# Patient Record
Sex: Female | Born: 1955 | Race: White | Hispanic: No | Marital: Married | Smoking: Never smoker
Health system: Southern US, Community
[De-identification: ages and names within clinical notes are randomized; demographics above are authoritative.]

## PROBLEM LIST (undated history)

## (undated) DIAGNOSIS — M1990 Unspecified osteoarthritis, unspecified site: Secondary | ICD-10-CM

## (undated) DIAGNOSIS — T7840XA Allergy, unspecified, initial encounter: Secondary | ICD-10-CM

## (undated) DIAGNOSIS — F988 Other specified behavioral and emotional disorders with onset usually occurring in childhood and adolescence: Secondary | ICD-10-CM

## (undated) HISTORY — PX: TUBAL LIGATION: SHX77

## (undated) HISTORY — PX: BILATERAL SALPINGECTOMY: SHX5743

## (undated) HISTORY — DX: Other specified behavioral and emotional disorders with onset usually occurring in childhood and adolescence: F98.8

## (undated) HISTORY — PX: COSMETIC SURGERY: SHX468

## (undated) HISTORY — DX: Allergy, unspecified, initial encounter: T78.40XA

---

## 2004-04-13 ENCOUNTER — Encounter: Admission: RE | Admit: 2004-04-13 | Discharge: 2004-04-13 | Payer: Self-pay | Admitting: *Deleted

## 2004-05-06 ENCOUNTER — Ambulatory Visit (HOSPITAL_COMMUNITY): Admission: RE | Admit: 2004-05-06 | Discharge: 2004-05-06 | Payer: Self-pay | Admitting: *Deleted

## 2004-05-06 ENCOUNTER — Encounter (INDEPENDENT_AMBULATORY_CARE_PROVIDER_SITE_OTHER): Payer: Self-pay | Admitting: *Deleted

## 2004-11-02 ENCOUNTER — Ambulatory Visit: Payer: Self-pay | Admitting: Internal Medicine

## 2005-01-20 ENCOUNTER — Ambulatory Visit: Payer: Self-pay | Admitting: Family Medicine

## 2005-05-11 ENCOUNTER — Encounter: Admission: RE | Admit: 2005-05-11 | Discharge: 2005-05-11 | Payer: Self-pay | Admitting: *Deleted

## 2006-05-02 ENCOUNTER — Ambulatory Visit: Payer: Self-pay | Admitting: Family Medicine

## 2006-05-08 ENCOUNTER — Ambulatory Visit: Payer: Self-pay | Admitting: Family Medicine

## 2006-05-22 ENCOUNTER — Encounter: Admission: RE | Admit: 2006-05-22 | Discharge: 2006-05-22 | Payer: Self-pay | Admitting: *Deleted

## 2007-06-10 ENCOUNTER — Encounter: Admission: RE | Admit: 2007-06-10 | Discharge: 2007-06-10 | Payer: Self-pay | Admitting: *Deleted

## 2007-11-25 ENCOUNTER — Telehealth (INDEPENDENT_AMBULATORY_CARE_PROVIDER_SITE_OTHER): Payer: Self-pay | Admitting: *Deleted

## 2007-12-07 ENCOUNTER — Encounter: Payer: Self-pay | Admitting: Family Medicine

## 2007-12-11 ENCOUNTER — Telehealth (INDEPENDENT_AMBULATORY_CARE_PROVIDER_SITE_OTHER): Payer: Self-pay | Admitting: *Deleted

## 2007-12-18 ENCOUNTER — Ambulatory Visit: Payer: Self-pay | Admitting: Family Medicine

## 2007-12-18 DIAGNOSIS — F329 Major depressive disorder, single episode, unspecified: Secondary | ICD-10-CM | POA: Insufficient documentation

## 2008-06-16 ENCOUNTER — Encounter: Admission: RE | Admit: 2008-06-16 | Discharge: 2008-06-16 | Payer: Self-pay | Admitting: Obstetrics and Gynecology

## 2008-06-19 ENCOUNTER — Encounter: Admission: RE | Admit: 2008-06-19 | Discharge: 2008-06-19 | Payer: Self-pay | Admitting: Obstetrics and Gynecology

## 2008-07-30 ENCOUNTER — Encounter: Payer: Self-pay | Admitting: Family Medicine

## 2008-08-25 ENCOUNTER — Telehealth (INDEPENDENT_AMBULATORY_CARE_PROVIDER_SITE_OTHER): Payer: Self-pay | Admitting: *Deleted

## 2008-12-10 ENCOUNTER — Telehealth (INDEPENDENT_AMBULATORY_CARE_PROVIDER_SITE_OTHER): Payer: Self-pay | Admitting: *Deleted

## 2009-02-02 ENCOUNTER — Ambulatory Visit: Payer: Self-pay | Admitting: Family Medicine

## 2009-02-05 ENCOUNTER — Ambulatory Visit: Payer: Self-pay | Admitting: Family Medicine

## 2009-02-05 LAB — CONVERTED CEMR LAB
Albumin: 3.5 g/dL (ref 3.5–5.2)
Alkaline Phosphatase: 64 units/L (ref 39–117)
BUN: 18 mg/dL (ref 6–23)
Basophils Absolute: 0 10*3/uL (ref 0.0–0.1)
Basophils Relative: 0.5 % (ref 0.0–3.0)
Bilirubin, Direct: 0 mg/dL (ref 0.0–0.3)
Calcium: 8.6 mg/dL (ref 8.4–10.5)
Eosinophils Absolute: 0.1 10*3/uL (ref 0.0–0.7)
GFR calc non Af Amer: 93.43 mL/min (ref >60)
Glucose, Bld: 89 mg/dL (ref 70–99)
HDL: 88.5 mg/dL (ref >39.00)
Ketones, urine, test strip: NEGATIVE
Lymphocytes Relative: 32.9 % (ref 12.0–46.0)
MCHC: 33.6 g/dL (ref 30.0–36.0)
MCV: 97.2 fL (ref 78.0–100.0)
Monocytes Absolute: 0.4 10*3/uL (ref 0.1–1.0)
Neutrophils Relative %: 56.3 % (ref 43.0–77.0)
Nitrite: NEGATIVE
RBC: 3.9 M/uL (ref 3.87–5.11)
RDW: 12.2 % (ref 11.5–14.6)
Sodium: 141 meq/L (ref 135–145)
Specific Gravity, Urine: 1.025
TSH: 1.71 microintl units/mL (ref 0.35–5.50)
Total Protein: 5.7 g/dL — ABNORMAL LOW (ref 6.0–8.3)
Triglycerides: 45 mg/dL (ref 0.0–149.0)
Urobilinogen, UA: 0.2
VLDL: 9 mg/dL (ref 0.0–40.0)
WBC Urine, dipstick: NEGATIVE

## 2009-02-08 ENCOUNTER — Encounter (INDEPENDENT_AMBULATORY_CARE_PROVIDER_SITE_OTHER): Payer: Self-pay | Admitting: *Deleted

## 2009-05-03 ENCOUNTER — Encounter: Payer: Self-pay | Admitting: Family Medicine

## 2009-06-22 ENCOUNTER — Telehealth (INDEPENDENT_AMBULATORY_CARE_PROVIDER_SITE_OTHER): Payer: Self-pay | Admitting: *Deleted

## 2009-07-14 ENCOUNTER — Encounter: Admission: RE | Admit: 2009-07-14 | Discharge: 2009-07-14 | Payer: Self-pay | Admitting: Obstetrics and Gynecology

## 2009-08-18 ENCOUNTER — Telehealth: Payer: Self-pay | Admitting: Family Medicine

## 2009-08-19 ENCOUNTER — Ambulatory Visit: Payer: Self-pay | Admitting: Family Medicine

## 2009-08-19 DIAGNOSIS — S0990XA Unspecified injury of head, initial encounter: Secondary | ICD-10-CM | POA: Insufficient documentation

## 2009-08-20 ENCOUNTER — Telehealth (INDEPENDENT_AMBULATORY_CARE_PROVIDER_SITE_OTHER): Payer: Self-pay | Admitting: *Deleted

## 2009-10-05 ENCOUNTER — Telehealth: Payer: Self-pay | Admitting: Family Medicine

## 2009-11-19 ENCOUNTER — Telehealth (INDEPENDENT_AMBULATORY_CARE_PROVIDER_SITE_OTHER): Payer: Self-pay | Admitting: *Deleted

## 2009-11-26 ENCOUNTER — Ambulatory Visit: Payer: Self-pay | Admitting: Family Medicine

## 2010-03-28 ENCOUNTER — Telehealth (INDEPENDENT_AMBULATORY_CARE_PROVIDER_SITE_OTHER): Payer: Self-pay | Admitting: *Deleted

## 2010-03-29 ENCOUNTER — Telehealth (INDEPENDENT_AMBULATORY_CARE_PROVIDER_SITE_OTHER): Payer: Self-pay | Admitting: *Deleted

## 2010-06-16 ENCOUNTER — Telehealth (INDEPENDENT_AMBULATORY_CARE_PROVIDER_SITE_OTHER): Payer: Self-pay | Admitting: *Deleted

## 2010-06-21 ENCOUNTER — Encounter: Payer: Self-pay | Admitting: Family Medicine

## 2010-07-09 ENCOUNTER — Ambulatory Visit: Payer: Self-pay | Admitting: Family Medicine

## 2010-07-11 ENCOUNTER — Telehealth (INDEPENDENT_AMBULATORY_CARE_PROVIDER_SITE_OTHER): Payer: Self-pay | Admitting: *Deleted

## 2010-07-29 ENCOUNTER — Encounter: Payer: Self-pay | Admitting: Family Medicine

## 2010-08-02 ENCOUNTER — Telehealth (INDEPENDENT_AMBULATORY_CARE_PROVIDER_SITE_OTHER): Payer: Self-pay | Admitting: *Deleted

## 2010-08-29 ENCOUNTER — Encounter: Payer: Self-pay | Admitting: Family Medicine

## 2010-08-29 ENCOUNTER — Ambulatory Visit: Payer: Self-pay | Admitting: Family Medicine

## 2010-08-31 ENCOUNTER — Encounter: Admission: RE | Admit: 2010-08-31 | Discharge: 2010-08-31 | Payer: Self-pay | Admitting: Family Medicine

## 2010-09-20 ENCOUNTER — Telehealth (INDEPENDENT_AMBULATORY_CARE_PROVIDER_SITE_OTHER): Payer: Self-pay | Admitting: *Deleted

## 2010-09-22 ENCOUNTER — Encounter (INDEPENDENT_AMBULATORY_CARE_PROVIDER_SITE_OTHER): Payer: Self-pay | Admitting: *Deleted

## 2010-11-06 ENCOUNTER — Encounter: Payer: Self-pay | Admitting: Family Medicine

## 2010-11-07 ENCOUNTER — Encounter: Payer: Self-pay | Admitting: Obstetrics and Gynecology

## 2010-11-16 NOTE — Assessment & Plan Note (Signed)
Summary: HIVES/DLO   Vital Signs:  Patient profile:   55 year old female Menstrual status:  regular Height:      68 inches (172.72 cm) Weight:      147.75 pounds (67.16 kg) BMI:     22.55 O2 Sat:      95 % on Room air Temp:     98.3 degrees F (36.83 degrees C) oral Pulse rate:   82 / minute BP sitting:   100 / 60  (left arm) Cuff size:   regular  Vitals Entered By: Lucious Groves CMA (July 09, 2010 10:11 AM)  O2 Flow:  Room air CC: C/O rash or hives/possibly bed bugs./kb, Rash Is Patient Diabetic? No Comments Patient notes that she has been having severe itching and Claritin/Benadryl have only helped temporarily. Patient has had allegy types symptoms such as eye redness, itch, and crust production, but denies fever, SOB, and HA./kb   History of Present Illness: Here with urticarial rash.  Recently started back Celebrex-one week ago.  Has taken benadryl and clarintan without relief. No change of soap, detergent, etc.  No hx of food allergy. Onset couple of days ago.  Rash      This is a 55 year old woman who presents with Rash.  The patient complains of hives and welts, but denies pustules and blisters.  The rash is located on the back, abdomen, right foot, and left thigh.  The rash is worse with heat and worse with scratching.  The patient denies the following symptoms: fever, headache, facial swelling, tongue swelling, burning, difficulty breathing, and sore throat.  The patient reports a history of new medication.  The patient denies history of recent tick bite.    Current Medications (verified): 1)  Zoloft 25 Mg  Tabs (Sertraline Hcl) .... Take One Tablet Daily 2)  Ritalin 10 Mg Tabs (Methylphenidate Hcl) .... Take 1 By Mouth Two Times A Day  Allergies (verified): No Known Drug Allergies  Review of Systems      See HPI  Physical Exam  General:  Well-developed,well-nourished,in no acute distress; alert,appropriate and cooperative throughout examination Eyes:  No  corneal or conjunctival inflammation noted. EOMI. Perrla. Funduscopic exam benign, without hemorrhages, exudates or papilledema. Vision grossly normal. Mouth:  Oral mucosa and oropharynx without lesions or exudates.  Teeth in good repair. Neck:  No deformities, masses, or tenderness noted. Lungs:  Normal respiratory effort, chest expands symmetrically. Lungs are clear to auscultation, no crackles or wheezes. Heart:  Normal rate and regular rhythm. S1 and S2 normal without gallop, murmur, click, rub or other extra sounds. Skin:  scattered hives buttocks, extremities and trunk. Cervical Nodes:  No lymphadenopathy noted   Impression & Recommendations:  Problem # 1:  URTICARIA (ICD-708.9) uncertain etiology.  She did recently start back Celebrex but no prior reactions to this.  Will hold Celebrex for now.  Consider addition of H2 blocker to her antihistaimine.  Depomedrol given. Orders: Depo- Medrol 80mg  (J1040) Admin of Therapeutic Inj  intramuscular or subcutaneous (64403)  Complete Medication List: 1)  Zoloft 25 Mg Tabs (Sertraline hcl) .... Take one tablet daily 2)  Ritalin 10 Mg Tabs (Methylphenidate hcl) .... Take 1 by mouth two times a day  Patient Instructions: 1)  Hold Celebrex for now. 2)  Continue antihistamines such as Allegra or Claritan 3)  Consider pepcid or zantac in addition to antihistamine if no better in a few days.   Medication Administration  Injection # 1:    Medication: Depo- Medrol  80mg     Diagnosis: URTICARIA (ICD-708.9)    Route: IM    Site: LUOQ gluteus    Exp Date: 01/14/2013    Lot #: [id number]    Mfr: Pharmacia    Patient tolerated injection without complications    Given by: Lucious Groves CMA (July 09, 2010 11:04 AM)  Orders Added: 1)  Depo- Medrol 80mg  [J1040] 2)  Admin of Therapeutic Inj  intramuscular or subcutaneous [96372] 3)  Est. Patient Level III [60454]

## 2010-11-16 NOTE — Progress Notes (Signed)
  Phone Note Refill Request   Refills Requested: Medication #1:  RITALIN 10 MG TABS take 1 by mouth two times a day.  Follow-up for Phone Call        left message rx is ready. Army Fossa CMA  March 28, 2010 3:02 PM     Prescriptions: RITALIN 10 MG TABS (METHYLPHENIDATE HCL) take 1 by mouth two times a day  #180 x 0   Entered by:   Army Fossa CMA   Authorized by:   Loreen Freud DO   Signed by:   Army Fossa CMA on 03/28/2010   Method used:   Print then Give to Patient   RxID:   [id number]

## 2010-11-16 NOTE — Letter (Signed)
Summary: Fax from Patient Regarding Need for EKG  Fax from Patient Regarding Need for EKG   Imported By: Lanelle Bal 08/16/2010 10:11:12  _____________________________________________________________________  External Attachment:    Type:   Image     Comment:   External Document

## 2010-11-16 NOTE — Progress Notes (Signed)
Summary: lmtc 9-2-RF REQUEST  Phone Note Refill Request Message from:  Patient on June 16, 2010 4:51 PM  Refills Requested: Medication #1:  RITALIN 10 MG TABS take 1 by mouth two times a day.   Last Refilled: 03/28/2010 LAST OV 11/19/09 LAST FILLED 03/28/10  Initial call taken by: Almeta Monas CMA Duncan Dull),  June 16, 2010 4:51 PM  Follow-up for Phone Call        refill x1 pt due for ov Follow-up by: Loreen Freud DO,  June 16, 2010 4:53 PM  Additional Follow-up for Phone Call Additional follow up Details #1::        Lmtcb.        Almeta Monas CMA Duncan Dull)  June 16, 2010 4:58 PM left message to call office office............Marland KitchenFelecia Deloach CMA  June 17, 2010 9:05 AM     Prescriptions: RITALIN 10 MG TABS (METHYLPHENIDATE HCL) take 1 by mouth two times a day  #30 x 0   Entered by:   Almeta Monas CMA (AAMA)   Authorized by:   Loreen Freud DO   Signed by:   Almeta Monas CMA (AAMA) on 06/17/2010   Method used:   Print then Give to Patient   RxID:   [id number] RITALIN 10 MG TABS (METHYLPHENIDATE HCL) take 1 by mouth two times a day  #180 x 0   Entered and Authorized by:   Loreen Freud DO   Signed by:   Loreen Freud DO on 06/16/2010   Method used:   Printed then faxed to ...       [facility]  [address] [phone]* (retail)       [address]       [place], [place]  [address]       Ph: [phone] or [phone]       Fax: [phone]   RxID:   [id number]  Pt notifeid 30 day supply printed for pt and the 180 day supply will be mailed to [facility].         Almeta Monas CMA Duncan Dull)  June 17, 2010 1:46 PM  Appended Document: lmtc 9-2-RF REQUEST Ritalin Rx mailed to [facility] per [place] at [employer]. [phone]       [facility] [phone]

## 2010-11-16 NOTE — Assessment & Plan Note (Signed)
Summary: RETURN VISIT///SPH   Vital Signs:  Patient profile:   55 year old female Menstrual status:  regular Temp:     98.2 degrees F oral Pulse rate:   82 / minute Pulse rhythm:   regular BP sitting:   122 / 80  (left arm) Cuff size:   regular  Vitals Entered By: Army Fossa CMA (November 26, 2009 12:52 PM) CC: follow up on ritalin.    History of Present Illness: pt her for ADD f/u.  No complaints.  Meds working well.  Current Medications (verified): 1)  Zoloft 25 Mg  Tabs (Sertraline Hcl) .... Take One Tablet Daily 2)  Ritalin 10 Mg Tabs (Methylphenidate Hcl) .... Take 1 By Mouth Two Times A Day  Allergies (verified): No Known Drug Allergies  Past History:  Past medical, surgical, family and social histories (including risk factors) reviewed for relevance to current acute and chronic problems.  Past Medical History: Reviewed history from 12/18/2007 and no changes required. Depression  Family History: Reviewed history from 12/18/2007 and no changes required. mother: living  father: living 3 brothers: living Family History Osteoporosis: father  Social History: Reviewed history from 02/02/2009 and no changes required. 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 Lungs are clear to auscultation, no crackles or wheezes. Heart:  normal rate and no murmur.   Psych:  Oriented X3 and normally interactive.     Impression & Recommendations:  Problem # 1:  ADD (ICD-314.00) ritalin 10 mg two times a day  rto 6 months  Complete Medication List: 1)  Zoloft 25 Mg Tabs (Sertraline hcl) .... Take one tablet daily 2)  Ritalin 10 Mg Tabs (Methylphenidate hcl) .... Take 1 by mouth  two times a day Prescriptions: RITALIN 10 MG TABS (METHYLPHENIDATE HCL) take 1 by mouth two times a day  #180 x 0   Entered and Authorized by:   Loreen Freud DO   Signed by:   Loreen Freud DO on 11/26/2009   Method used:   Printed then faxed to ...       [facility] [phone]* (retail)       [address]       [place], [place]  [address]       Ph: [phone] or [phone]       Fax: [phone]   RxID:   [id number]

## 2010-11-16 NOTE — Progress Notes (Signed)
Summary: hives  Phone Note Call from Patient   Caller: Patient Summary of Call: [facility] Triage Call Report Triage Record Num: [id number] Operator: Tomasita Crumble Patient Name: Jennifer Stokes Call Date & Time: 07/09/2010 8:56:44AM Patient Phone: [phone] PCP: Lelon Perla Patient Gender: Female PCP Fax : [phone] Patient DOB: 02/21/1956 Practice Name: [facility] Reason for Call: Pt. calling. States she started Celebrex 2-3 weeks ago and now has hives onset 9/22.; took it a year ago without sx . Claritin and Benadryl have helped some. Caller transferred to [facility] for same day appointment per Hives protocol. Protocol(s) Used: Hives Protocol(s) Used: Rash Recommended Outcome per Protocol: See Provider within 24 hours Reason for Outcome: Skin changes that looks like hives (itchy welts or wheals) Episode of hives persisting for 3 or more days despite home care AND not previously evaluated  Follow-up for Phone Call        seen 07/09/10 at [facility].....Marland KitchenMarland KitchenDoristine Devoid CMA  July 11, 2010 3:18 PM

## 2010-11-16 NOTE — Letter (Signed)
Summary: Out of Work  [facility] [employer]  [address] [place], [place] [address]   Phone: [phone]  Fax: [phone]    September 22, 2010      To Whom It May Concern:   Mrs. Jennifer Stokes was seen in our office on August 29, 2010 for her annual check-up.           Sincerely,      Loreen Freud, DO

## 2010-11-16 NOTE — Progress Notes (Signed)
Summary: REFILL  Phone Note Refill Request Message from:  Patient on March 29, 2010 10:34 AM  Refills Requested: Medication #1:  RITALIN 10 MG TABS take 1 by mouth two times a day. PATIENT WANTS 3 MONTH SUPPLY - SHE IS AWARE THAT SHE NEEDS TO PICK UP RX TO MAIL TO [facility]   Initial call taken by: Okey Regal Spring,  March 29, 2010 10:52 AM  Follow-up for Phone Call        rx is upfront for pt. Army Fossa CMA  March 29, 2010 11:39 AM

## 2010-11-16 NOTE — Progress Notes (Signed)
Summary: patient requested copy of cpx  Phone Note Call from Patient   Summary of Call: patient requested copy of office note for cpx to fax to ins -- faxed  to patient [phone] Initial call taken by: Okey Regal Spring,  September 20, 2010 11:04 AM

## 2010-11-16 NOTE — Progress Notes (Signed)
Summary: Pt needs EKG 08/02/10,10-19  Phone Note Outgoing Call   Call placed by: Almeta Monas CMA Duncan Dull),  August 02, 2010 8:05 AM Call placed to: Patient Details for Reason: Pt needs an EKG Summary of Call: Rcv'd pt lab results and she wrote on them that she needed and EKG per her ins comp, she wanted to know if we did them here. Her CPX is scheduled for 08/29/10, I tried calling pt to see if she would need an EKG sooner......Marland Kitchen Almeta Monas CMA Duncan Dull)  August 02, 2010 8:09 AM  left message to call office..................Marland KitchenFelecia Deloach CMA  August 03, 2010 9:38 AM    Follow-up for Phone Call        spk with pt and adv that we do EKG's in office and we can complete at her cpx if it's ok for her.Marland KitchenMarland KitchenPer pt it  is ok that she waits until her nov 14th appt....will give Dr.Lowne her labs to review. Follow-up by: Almeta Monas CMA (AAMA),  August 04, 2010 1:25 PM

## 2010-11-16 NOTE — Progress Notes (Signed)
Summary: Needs Ov  Phone Note Outgoing Call   Summary of Call: Pt called requesting refill on Ritalin- I called pt back and left a message. We need to schedule and OV to follow up on her meds and we can refill meds. Army Fossa CMA  November 19, 2009 1:43 PM

## 2010-11-17 NOTE — Assessment & Plan Note (Signed)
Summary: CPX ///SPH   Vital Signs:  Patient profile:   55 year old female Menstrual status:  regular Height:      67.75 inches Weight:      146.6 pounds BMI:     22.54 Temp:     98.3 degrees F oral BP sitting:   112 / 74  (left arm) Cuff size:   regular  Vitals Entered By: Almeta Monas CMA Duncan Dull) (August 29, 2010 1:42 PM) CC: cpx--not fasting, no pap needed   History of Present Illness: Pt here for cpe ,  no pap.   Pt will get labs another day.  Pt doing well.    Preventive Screening-Counseling & Management  Alcohol-Tobacco     Alcohol drinks/day: 0     Smoking Status: never  Caffeine-Diet-Exercise     Caffeine use/day: 2     Does Patient Exercise: yes     Type of exercise: bike, swimming, pilates     Times/week: 5  Hep-HIV-STD-Contraception     Dental Visit-last 6 months yes     SBE monthly: yes  Safety-Violence-Falls     Seat Belt Use: yes     Smoke Detectors: yes     Violence in the Home: no risk noted     Sexual Abuse: no  Current Medications (verified): 1)  Zoloft 25 Mg  Tabs (Sertraline Hcl) .... Take One Tablet Daily 2)  Ritalin 10 Mg Tabs (Methylphenidate Hcl) .... 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  Past Medical History: Depression Current Problems:  ADD (ICD-314.00) HEAD TRAUMA (ICD-959.01) PREVENTIVE HEALTH CARE  (ICD-V70.0) DEPRESSION (ICD-311) FAMILY HISTORY OSTEOPOROSIS (ICD-V17.8)  Past Surgical History: Denies surgical history  Family History: Reviewed history from 12/18/2007 and no changes required. mother: living  father: living 3 brothers: living Family History Osteoporosis: father  Social History: Reviewed history from 02/02/2009 and no changes required. 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 Eyes:  Denies blurring, discharge, double vision, eye irritation, eye pain, halos, itching, light sensitivity, red eye, vision loss-1 eye, and vision loss-both eyes; optho- several x a year. ENT:  Denies decreased hearing, difficulty swallowing, ear discharge, earache, hoarseness, nasal congestion, nosebleeds, postnasal drainage, ringing in ears, sinus pressure, and sore throat. Resp:  Denies chest discomfort, chest pain with inspiration, cough, coughing up blood, excessive snoring, hypersomnolence, morning headaches, pleuritic, shortness of breath, sputum productive, and wheezing. GI:  Denies abdominal pain, bloody stools, change in bowel habits, constipation, dark tarry stools, diarrhea, excessive appetite, gas, hemorrhoids, indigestion, loss of appetite, nausea, and vomiting. GU:  Denies abnormal vaginal bleeding, decreased libido, discharge, dysuria, genital sores, hematuria, incontinence, nocturia, urinary frequency, and urinary hesitancy. MS:  Denies joint pain, joint redness, joint swelling, loss of strength, low back pain, mid back pain, muscle aches, muscle , cramps, muscle weakness,  stiffness, and thoracic pain; plantar fascitis. Derm:  Denies changes in color of skin, changes in nail beds, dryness, excessive perspiration, flushing, hair loss, insect bite(s), itching, lesion(s),  poor wound healing, and rash. Neuro:  Denies brief paralysis, difficulty with concentration, disturbances in coordination, falling down, headaches, inability to speak, memory loss, numbness, poor balance, seizures, sensation of room spinning, tingling, tremors, visual disturbances, and weakness. Psych:  Denies alternate hallucination ( auditory/visual), anxiety, depression, easily angered, easily tearful, irritability, mental problems, panic attacks, sense of great danger, suicidal thoughts/plans, thoughts of violence, unusual visions or sounds, and thoughts /plans of harming others. Endo:  Denies cold intolerance, excessive hunger, excessive thirst, excessive urination, heat intolerance, polyuria, and weight change. Heme:  Denies abnormal bruising, bleeding, enlarge lymph nodes, fevers, pallor, and skin discoloration. Allergy:  Denies hives or rash, itching eyes, persistent infections, seasonal allergies, and sneezing.  Physical Exam  General:  Well-developed,well-nourished,in no acute distress; alert,appropriate and cooperative throughout examination Head:  Normocephalic and atraumatic without obvious abnormalities. No apparent alopecia or balding. Eyes:  vision grossly intact, pupils equal, pupils round, pupils reactive to light, and no injection.   Ears:  External ear exam shows no significant lesions or deformities.  Otoscopic examination reveals clear canals, tympanic membranes are intact bilaterally without bulging, retraction, inflammation or discharge. Hearing is grossly normal bilaterally. Nose:  External nasal examination shows no deformity or inflammation. Nasal mucosa are pink and moist without lesions or exudates. Mouth:  Oral mucosa and oropharynx without lesions or exudates.  Teeth in good repair. Neck:  No deformities, masses, or tenderness noted. Breasts:  gyn Lungs:  Normal respiratory effort, chest expands symmetrically. Lungs are clear to auscultation, no crackles or  wheezes. Heart:  normal rate, regular rhythm, and no murmur.   Abdomen:  Bowel sounds positive,abdomen soft and non-tender without masses, organomegaly or hernias noted. Rectal:  gyn Genitalia:  gyn Msk:  normal ROM, no joint tenderness, no joint swelling, no joint warmth, no redness over joints, no joint deformities, no joint instability, and no crepitation.   Pulses:  R and L carotid,radial,femoral,dorsalis pedis and posterior tibial pulses are full and equal bilaterally Extremities:  No clubbing, cyanosis, edema, or deformity noted with normal full range of motion of all joints.   Neurologic:  alert & oriented X3, strength normal in all extremities, and gait normal.   Skin:  Intact without suspicious lesions or rashes Cervical Nodes:  No lymphadenopathy noted Psych:  Cognition and judgment appear intact. Alert and cooperative with normal attention span and concentration. No apparent delusions, illusions, hallucinations   Impression & Recommendations:  Problem # 1:  PREVENTIVE HEALTH CARE (ICD-V70.0)  labs from work reviewed with pt ghm utd   Orders: EKG w/ Interpretation (93000)  Problem # 2:  ADD (ICD-314.00) Assessment: Improved  refill ritalin rto 6 months  Orders: EKG w/ Interpretation (93000)  Problem # 3:  DEPRESSION (ICD-311) Assessment: Improved  Her updated medication list for this problem includes:    Zoloft 25 Mg Tabs (Sertraline hcl) .Marland Kitchen... Take one tablet daily  Orders: EKG w/ Interpretation (93000)  Complete Medication List: 1)  Zoloft 25 Mg Tabs (Sertraline hcl) .... Take one tablet daily 2)  Ritalin 10 Mg Tabs (Methylphenidate hcl) .... Take 1 by mouth two times a day  Patient Instructions: 1)  Please schedule a follow-up appointment in 6 months .  Prescriptions: RITALIN 10 MG TABS (METHYLPHENIDATE HCL) take 1 by mouth two times a day  #90 x 0   Entered and Authorized  by:   Loreen Freud DO   Signed by:   Loreen Freud DO on 08/29/2010   Method  used:   Print then Give to Patient   RxID:   [id number] ZOLOFT 25 MG  TABS (SERTRALINE HCL) take one tablet daily  #90 Tablet x 3   Entered and Authorized by:   Loreen Freud DO   Signed by:   Loreen Freud DO on 08/29/2010   Method used:   Faxed to ...       [facility] (mail-order)             , [place]         Ph: [phone]       Fax: [phone]   RxID:   [id number]    Orders Added: 1)  Est. Patient 40-64 years [38756] 2)  EKG w/ Interpretation [93000]    PAP Result Date:  08/16/2010 PAP Result:  normal PAP Next Due:  1 yr Mammogram Result Date:  08/22/2010   Appended Document: CPX ///SPH Call from pharmacy to verify disp #. Pt usually get disp #`180 for 3 month supply per sig. Ok verbally given to disp #180  Prescriptions: RITALIN 10 MG TABS (METHYLPHENIDATE HCL) take 1 by mouth two times a day  #180 x 0   Entered by:   Jeremy Johann CMA   Authorized by:   Loreen Freud DO   Signed by:   Jeremy Johann CMA on 09/27/2010   Method used:   Telephoned to ...       [facility] (mail-order)             , [place]         Ph: [phone]       Fax: [phone]   RxID:   [id number]

## 2010-12-29 ENCOUNTER — Telehealth: Payer: Self-pay | Admitting: Family Medicine

## 2011-01-03 NOTE — Progress Notes (Signed)
Summary: generic Zoloft and Ritalin refills  Phone Note Refill Request Call back at Work Phone [phone] Message from:  Patient on December 29, 2010 3:53 PM  Refills Requested: Medication #1:  ZOLOFT 25 MG  TABS take one tablet daily  Medication #2:  RITALIN 10 MG TABS take 1 by mouth two times a day. [facility]   ([facility] will be available until 3/31/201, then they will change over to new company)---pt says she takes generic for both prescriptions,  please call her if prescriptions cannot be filled, she will change to local pharmacy because she is almost out  Next Appointment Scheduled: none Initial call taken by: Jerolyn Shin,  December 29, 2010 4:07 PM  Follow-up for Phone Call        spoke with patient ans she wanted to have RX mailed to [facility]. please advise if this is ok so we can change to a 90 day supply Follow-up by: Almeta Monas CMA Duncan Dull),  December 29, 2010 5:05 PM  Additional Follow-up for Phone Call Additional follow up Details #1::        ok to do 90 day supply Additional Follow-up by: Loreen Freud DO,  December 29, 2010 5:25 PM    Additional Follow-up for Phone Call Additional follow up Details #2::    Ritalin faxed to [facility] Follow-up by: Almeta Monas CMA Duncan Dull),  December 30, 2010 10:17 AM  Prescriptions: RITALIN 10 MG TABS (METHYLPHENIDATE HCL) take 1 by mouth two times a day  #180 x 0   Entered and Authorized by:   Loreen Freud DO   Signed by:   Loreen Freud DO on 12/29/2010   Method used:   Printed then faxed to ...       [facility] (mail-order)             , [place]         Ph: [phone]       Fax: [phone]   RxID:   [id number] ZOLOFT 25 MG  TABS (SERTRALINE HCL) take one tablet daily  #90 Tablet x 3   Entered and Authorized by:   Loreen Freud DO   Signed by:   Loreen Freud DO on 12/29/2010   Method used:   Faxed to ...       [facility] (mail-order)             , [place]         Ph: [phone]       Fax: [phone]   RxID:   [id number]

## 2011-01-13 ENCOUNTER — Other Ambulatory Visit: Payer: Self-pay

## 2011-01-13 MED ORDER — METHYLPHENIDATE HCL 10 MG PO TABS: 10.0000 mg | ORAL_TABLET | Freq: Two times a day (BID) | ORAL | Status: DC

## 2011-01-19 ENCOUNTER — Other Ambulatory Visit: Payer: Self-pay

## 2011-01-19 MED ORDER — SERTRALINE HCL 25 MG PO TABS: 25.0000 mg | ORAL_TABLET | Freq: Every day | ORAL | Status: DC
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
## 2011-03-03 NOTE — Op Note (Signed)
NAMEIsraella, Jennifer Stokes                             ACCOUNT NO.:  [id number]   MEDICAL RECORD NO.:  [id number]                   PATIENT TYPE:  AMB   LOCATION:  SDC                                  FACILITY:  [facility]   PHYSICIAN:  [place] Stokes. Earlene Stokes, M.D.               DATE OF BIRTH:  11/19/1955   DATE OF PROCEDURE:  05/06/2004  DATE OF DISCHARGE:                                 OPERATIVE REPORT   PREOPERATIVE DIAGNOSES:  Abnormal bleeding, questionable polyp.   POSTOPERATIVE DIAGNOSES:  Abnormal bleeding, questionable polyp.   PROCEDURE:  Hysteroscopy D&C.   SURGEON:  Chester Holstein. Earlene Stokes, M.D.   ANESTHESIA:  LMA, general and 10 mL of 1% Nesacaine paracervical block.   FLUID DEFICIT:  50 mL sorbitol.   SPECIMENS:  Endometrial curettings.   FINDINGS:  Overall proliferative appearing endometrium with one focal area  that may be a small endometrial polyp. Tubal ostia appeared normal. No other  focal lesions or distortions of the endometrial cavity.   INDICATIONS FOR PROCEDURE:  Patient with above history.  Sonohysterogram  indicative of possible endometrial polyp or focus of proliferative  endometrium. Operative risks were discussed prior to the procedure including  infection, bleeding, uterine perforation, __________ and damage to  surrounding organs.   DESCRIPTION OF PROCEDURE:  The patient was taken to the operating room and  LMA and general anesthesia obtained. She was placed in the Corona stirrups  and prepped and draped in a standard fashion. Bladder emptied with a red  rubber catheter. Examination under anesthesia showed a retroverted normal  size uterus, no adnexal masses palpable.   The speculum was inserted, single tooth attached, paracervical block placed,  cervix dilated to a #21 without difficulty.  A diagnostic hysteroscope was  inserted after being flushed with sorbitol, the above findings noted.   The uterus was gently curetted until a gritty texture noted. The specimen  was  submitted as endometrial curettings. The scope was reinserted, no other  focal lesions identified. Therefore the procedure was terminated.   The patient tolerated the procedure well and there were no complications.  She was taken to the recovery room awake, alert in stable condition.                                               Jennifer Stokes, M.D.   WBD/MEDQ  D:  05/06/2004  T:  05/07/2004  Job:  [id number]

## 2011-04-20 ENCOUNTER — Ambulatory Visit (INDEPENDENT_AMBULATORY_CARE_PROVIDER_SITE_OTHER): Payer: Self-pay | Admitting: Family Medicine

## 2011-04-20 DIAGNOSIS — K649 Unspecified hemorrhoids: Secondary | ICD-10-CM

## 2011-04-20 DIAGNOSIS — Z0289 Encounter for other administrative examinations: Secondary | ICD-10-CM

## 2011-04-20 MED ORDER — HYDROCORTISONE ACE-PRAMOXINE 1-1 % RE FOAM: 1.0000 | Freq: Two times a day (BID) | RECTAL | Status: AC

## 2011-04-20 MED ORDER — HYDROCORTISONE ACETATE 25 MG RE SUPP: 25.0000 mg | Freq: Two times a day (BID) | RECTAL | Status: DC

## 2011-04-20 NOTE — Progress Notes (Signed)
  Subjective:    Patient ID: Jennifer Stokes, female    DOB: [birth year], 55 y.o.   MRN: [id number]  HPI    Review of Systems No show    Objective:   Physical Exam        Assessment & Plan:

## 2011-05-25 ENCOUNTER — Other Ambulatory Visit: Payer: Self-pay | Admitting: Family Medicine

## 2011-05-25 DIAGNOSIS — F988 Other specified behavioral and emotional disorders with onset usually occurring in childhood and adolescence: Secondary | ICD-10-CM

## 2011-05-25 MED ORDER — METHYLPHENIDATE HCL 10 MG PO TABS: 10.0000 mg | ORAL_TABLET | Freq: Two times a day (BID) | ORAL | Status: DC

## 2011-09-04 ENCOUNTER — Other Ambulatory Visit: Payer: Self-pay | Admitting: Family Medicine

## 2011-09-04 MED ORDER — SERTRALINE HCL 25 MG PO TABS: 25.0000 mg | ORAL_TABLET | Freq: Every day | ORAL | Status: DC

## 2011-09-04 NOTE — Telephone Encounter (Signed)
Faxed.   KP 

## 2011-09-12 ENCOUNTER — Other Ambulatory Visit: Payer: Self-pay

## 2011-09-12 DIAGNOSIS — F988 Other specified behavioral and emotional disorders with onset usually occurring in childhood and adolescence: Secondary | ICD-10-CM

## 2011-09-12 MED ORDER — METHYLPHENIDATE HCL 10 MG PO TABS: 10.0000 mg | ORAL_TABLET | Freq: Two times a day (BID) | ORAL | Status: DC

## 2011-09-12 NOTE — Telephone Encounter (Signed)
Rx mailed for a 90 day supply and 30 day supply left at check.... VM left making patient aware     KP

## 2011-09-20 ENCOUNTER — Other Ambulatory Visit: Payer: Self-pay | Admitting: Family Medicine

## 2011-09-20 DIAGNOSIS — Z1231 Encounter for screening mammogram for malignant neoplasm of breast: Secondary | ICD-10-CM

## 2011-10-03 ENCOUNTER — Encounter: Payer: Self-pay | Admitting: Family Medicine

## 2011-10-03 ENCOUNTER — Ambulatory Visit (INDEPENDENT_AMBULATORY_CARE_PROVIDER_SITE_OTHER): Payer: BC Managed Care – PPO | Admitting: Family Medicine

## 2011-10-03 ENCOUNTER — Other Ambulatory Visit (HOSPITAL_COMMUNITY)
Admission: RE | Admit: 2011-10-03 | Discharge: 2011-10-03 | Disposition: A | Discharge status: Home / Self Care | Payer: BC Managed Care – PPO | Source: Ambulatory Visit | Attending: Family Medicine | Admitting: Family Medicine

## 2011-10-03 VITALS — BP 108/70 | HR 73 | Temp 97.5°F | Ht 67.75 in | Wt 134.6 lb

## 2011-10-03 DIAGNOSIS — Z23 Encounter for immunization: Secondary | ICD-10-CM

## 2011-10-03 DIAGNOSIS — R319 Hematuria, unspecified: Secondary | ICD-10-CM

## 2011-10-03 DIAGNOSIS — Z2911 Encounter for prophylactic immunotherapy for respiratory syncytial virus (RSV): Secondary | ICD-10-CM

## 2011-10-03 DIAGNOSIS — Z01419 Encounter for gynecological examination (general) (routine) without abnormal findings: Secondary | ICD-10-CM | POA: Insufficient documentation

## 2011-10-03 DIAGNOSIS — Z0000 Encounter for general adult medical examination without abnormal findings: Secondary | ICD-10-CM

## 2011-10-03 LAB — POCT URINALYSIS DIPSTICK
Bilirubin, UA: NEGATIVE
Ketones, UA: NEGATIVE
Protein, UA: NEGATIVE
pH, UA: 7.5

## 2011-10-03 LAB — CBC WITH DIFFERENTIAL/PLATELET
Basophils Absolute: 0 10*3/uL (ref 0.0–0.1)
Eosinophils Absolute: 0 10*3/uL (ref 0.0–0.7)
HCT: 40.2 % (ref 36.0–46.0)
Hemoglobin: 13.6 g/dL (ref 12.0–15.0)
Lymphs Abs: 1.6 10*3/uL (ref 0.7–4.0)
MCHC: 33.9 g/dL (ref 30.0–36.0)
MCV: 95.7 fl (ref 78.0–100.0)
Monocytes Absolute: 0.3 10*3/uL (ref 0.1–1.0)
Monocytes Relative: 5.5 % (ref 3.0–12.0)
Neutro Abs: 3.7 10*3/uL (ref 1.4–7.7)
RDW: 12.8 % (ref 11.5–14.6)

## 2011-10-03 LAB — BASIC METABOLIC PANEL
CO2: 28 mEq/L (ref 19–32)
Chloride: 101 mEq/L (ref 96–112)
Glucose, Bld: 92 mg/dL (ref 70–99)
Potassium: 4 mEq/L (ref 3.5–5.1)
Sodium: 139 mEq/L (ref 135–145)

## 2011-10-03 LAB — HEPATIC FUNCTION PANEL
Albumin: 4.4 g/dL (ref 3.5–5.2)
Alkaline Phosphatase: 101 U/L (ref 39–117)
Total Protein: 7.1 g/dL (ref 6.0–8.3)

## 2011-10-03 LAB — LIPID PANEL: HDL: 118.2 mg/dL (ref >39.00)

## 2011-10-03 MED ORDER — METHYLPHENIDATE HCL 20 MG PO TABS: 20.0000 mg | ORAL_TABLET | Freq: Two times a day (BID) | ORAL | Status: DC

## 2011-10-03 NOTE — Progress Notes (Signed)
Subjective:     Jennifer Stokes is a 55 y.o. female and is here for a comprehensive physical exam. The patient reports no problems.  History   Social History  . Marital Status: Married    Spouse Name: N/A    Number of Children: N/A  . Years of Education: N/A   Occupational History  . financial planner    Social History Main Topics  . Smoking status: Never Smoker   . Smokeless tobacco: Never Used  . Alcohol Use: No  . Drug Use: No  . Sexually Active: Yes -- Female partner(s)   Other Topics Concern  . Not on file   Social History Narrative  . No narrative on file   Health Maintenance  Topic Date Due  . Pap Smear  03/15/1975  . Tetanus/tdap  03/14/1976  . Influenza Vaccine  07/16/2012  . Mammogram  08/31/2012  . Colonoscopy  10/03/2019    The following portions of the patient's history were reviewed and updated as appropriate: allergies, current medications, past family history, past medical history, past social history, past surgical history and problem list.  Review of Systems Review of Systems  Constitutional: Negative for activity change, appetite change and fatigue.  HENT: Negative for hearing loss, congestion, tinnitus and ear discharge.  dentist q55m Eyes: Negative for visual disturbance (see optho q1y -- vision corrected to 20/20 with glasses).  Respiratory: Negative for cough, chest tightness and shortness of breath.   Cardiovascular: Negative for chest pain, palpitations and leg swelling.  Gastrointestinal: Negative for abdominal pain, diarrhea, constipation and abdominal distention.  Genitourinary: Negative for urgency, frequency, decreased urine volume and difficulty urinating.  Musculoskeletal: Negative for back pain, arthralgias and gait problem.  Skin: Negative for color change, pallor and rash.  Neurological: Negative for dizziness, light-headedness, numbness and headaches.  Hematological: Negative for adenopathy. Does not bruise/bleed easily.    Psychiatric/Behavioral: Negative for suicidal ideas, confusion, sleep disturbance, self-injury, dysphoric mood, decreased concentration and agitation.       Objective:    BP 108/70  Pulse 73  Temp(Src) 97.5 F (36.4 C) (Oral)  Ht 5' 7.75" (1.721 m)  Wt 134 lb 9.6 oz (61.054 kg)  BMI 20.62 kg/m2  SpO2 97%  General Appearance:    Alert, cooperative, no distress, appears stated age  Head:    Normocephalic, without obvious abnormality, atraumatic  Eyes:    PERRL, conjunctiva/corneas clear, EOM's intact, fundi    benign, both eyes  Ears:    Normal TM's and external ear canals, both ears  Nose:   Nares normal, septum midline, mucosa normal, no drainage    or sinus tenderness  Throat:   Lips, mucosa, and tongue normal; teeth and gums normal  Neck:   Supple, symmetrical, trachea midline, no adenopathy;    thyroid:  no enlargement/tenderness/nodules; no carotid   bruit or JVD  Back:     Symmetric, no curvature, ROM normal, no CVA tenderness  Lungs:     Clear to auscultation bilaterally, respirations unlabored  Chest Wall:    No tenderness or deformity   Heart:    Regular rate and rhythm, S1 and S2 normal, no murmur, rub   or gallop  Breast Exam:    No tenderness, masses, or nipple abnormality  Abdomen:     Soft, non-tender, bowel sounds active all four quadrants,    no masses, no organomegaly  Genitalia:    Normal female without lesion, discharge or tenderness  Rectal:    Normal tone, pap done,  no masses or tenderness;   guaiac negative stool  Extremities:   Extremities normal, atraumatic, no cyanosis or edema  Pulses:   2+ and symmetric all extremities  Skin:   Skin color, texture, turgor normal, no rashes or lesions  Lymph nodes:   Cervical, supraclavicular, and axillary nodes normal  Neurologic:   CNII-XII intact, normal strength, sensation and reflexes    throughout     Assessment:    Healthy female exam.     ADD depression Plan:  ghm utd Check labs    See After  Visit Summary for Counseling Recommendations

## 2011-10-03 NOTE — Patient Instructions (Signed)

## 2011-10-04 ENCOUNTER — Ambulatory Visit
Admission: RE | Admit: 2011-10-04 | Discharge: 2011-10-04 | Disposition: A | Discharge status: Home / Self Care | Payer: BC Managed Care – PPO | Source: Ambulatory Visit | Attending: Family Medicine | Admitting: Family Medicine

## 2011-10-04 DIAGNOSIS — Z1231 Encounter for screening mammogram for malignant neoplasm of breast: Secondary | ICD-10-CM

## 2011-10-04 NOTE — Progress Notes (Signed)
Addended by: Legrand Como on: 10/04/2011 02:06 PM   Modules accepted: Orders

## 2011-10-06 ENCOUNTER — Encounter: Payer: Self-pay | Admitting: Family Medicine

## 2011-11-02 ENCOUNTER — Telehealth: Payer: Self-pay

## 2011-11-02 NOTE — Telephone Encounter (Signed)
Msg for me to return patient call..I tried calling patient but her voicemail is full      Kp

## 2012-01-23 ENCOUNTER — Other Ambulatory Visit: Payer: Self-pay

## 2012-01-23 DIAGNOSIS — R319 Hematuria, unspecified: Secondary | ICD-10-CM

## 2012-01-23 DIAGNOSIS — Z23 Encounter for immunization: Secondary | ICD-10-CM

## 2012-01-23 DIAGNOSIS — Z0000 Encounter for general adult medical examination without abnormal findings: Secondary | ICD-10-CM

## 2012-01-23 MED ORDER — METHYLPHENIDATE HCL 20 MG PO TABS: 20.0000 mg | ORAL_TABLET | Freq: Two times a day (BID) | ORAL | Status: DC

## 2012-01-23 NOTE — Telephone Encounter (Signed)
Requested medication to send to mail order.     KP

## 2012-01-26 ENCOUNTER — Telehealth: Payer: Self-pay | Admitting: Family Medicine

## 2012-01-26 MED ORDER — SERTRALINE HCL 25 MG PO TABS: 25.0000 mg | ORAL_TABLET | Freq: Every day | ORAL | Status: DC

## 2012-01-26 NOTE — Telephone Encounter (Signed)
Refill: Sertraline hcl tablet 25 mg. Take 1 by mouth daily. # 90

## 2012-04-19 ENCOUNTER — Other Ambulatory Visit: Payer: Self-pay | Admitting: *Deleted

## 2012-04-19 MED ORDER — METHYLPHENIDATE HCL 20 MG PO TABS: 20.0000 mg | ORAL_TABLET | Freq: Two times a day (BID) | ORAL | Status: DC

## 2012-04-19 NOTE — Telephone Encounter (Signed)
Pt aware Rx ready for pick up 

## 2012-05-29 ENCOUNTER — Other Ambulatory Visit: Payer: Self-pay | Admitting: Family Medicine

## 2012-05-29 MED ORDER — METHYLPHENIDATE HCL 20 MG PO TABS: 20.0000 mg | ORAL_TABLET | Freq: Two times a day (BID) | ORAL | Status: DC

## 2012-05-29 NOTE — Telephone Encounter (Signed)
Pt called scheduled her CPE for 12.20.13 at 930am (Last cpe was 12.18.12) Pt stated she needs a 90-day refill for her Ritlan  Last wrt 7.5.13 # 60 RITALIN 20 MG Take 1 tablet (20 mg total) by mouth 2 (two) times daily. Last ov was CPE 12.18.12 Cb# [phone]  NOTE pt does have my chart

## 2012-05-29 NOTE — Telephone Encounter (Signed)
Anne aware Rx ready for pick up.     KP 

## 2012-06-06 ENCOUNTER — Other Ambulatory Visit: Payer: Self-pay | Admitting: Family Medicine

## 2012-06-06 MED ORDER — SERTRALINE HCL 25 MG PO TABS: 25.0000 mg | ORAL_TABLET | Freq: Every day | ORAL | Status: DC

## 2012-06-06 NOTE — Telephone Encounter (Signed)
Refill ZOLOFT 25 MG Take 1 tablet (25 mg total) by mouth daily. #90 Last wrt 4.12.13 #90 wt/1-refill Last ov 12.18.12 v70 Current CPE for 12.20.13 at 930am

## 2012-09-13 ENCOUNTER — Other Ambulatory Visit: Payer: Self-pay | Admitting: Family Medicine

## 2012-09-13 DIAGNOSIS — Z1231 Encounter for screening mammogram for malignant neoplasm of breast: Secondary | ICD-10-CM

## 2012-09-26 ENCOUNTER — Encounter: Payer: Self-pay | Admitting: Family Medicine

## 2012-09-26 ENCOUNTER — Ambulatory Visit (INDEPENDENT_AMBULATORY_CARE_PROVIDER_SITE_OTHER): Payer: BC Managed Care – PPO | Admitting: Family Medicine

## 2012-09-26 ENCOUNTER — Encounter: Payer: Self-pay | Admitting: Lab

## 2012-09-26 ENCOUNTER — Telehealth: Payer: Self-pay | Admitting: *Deleted

## 2012-09-26 VITALS — BP 110/70 | HR 80 | Temp 98.6°F | Wt 137.0 lb

## 2012-09-26 DIAGNOSIS — F32A Depression, unspecified: Secondary | ICD-10-CM

## 2012-09-26 DIAGNOSIS — F329 Major depressive disorder, single episode, unspecified: Secondary | ICD-10-CM

## 2012-09-26 DIAGNOSIS — F988 Other specified behavioral and emotional disorders with onset usually occurring in childhood and adolescence: Secondary | ICD-10-CM

## 2012-09-26 MED ORDER — METHYLPHENIDATE HCL 20 MG PO TABS: 20.0000 mg | ORAL_TABLET | Freq: Two times a day (BID) | ORAL | Status: DC

## 2012-09-26 MED ORDER — SERTRALINE HCL 25 MG PO TABS: 25.0000 mg | ORAL_TABLET | Freq: Every day | ORAL | Status: DC

## 2012-09-26 NOTE — Telephone Encounter (Signed)
Pt called triage line lv wants refill for her Ritalin at time of OV with her child (Lane Bley 3pm) if possible.

## 2012-09-26 NOTE — Patient Instructions (Addendum)
Attention Deficit Hyperactivity Disorder Attention deficit hyperactivity disorder (ADHD) is a problem with behavior issues based on the way the brain functions (neurobehavioral disorder). It is a common reason for behavior and academic problems in school. CAUSES  The cause of ADHD is unknown in most cases. It may run in families. It sometimes can be associated with learning disabilities and other behavioral problems. SYMPTOMS  There are 3 types of ADHD. The 3 types and some of the symptoms include:  Inattentive  Gets bored or distracted easily.  Loses or forgets things. Forgets to hand in homework.  Has trouble organizing or completing tasks.  Difficulty staying on task.  An inability to organize daily tasks and school work.  Leaving projects, chores, or homework unfinished.  Trouble paying attention or responding to details. Careless mistakes.  Difficulty following directions. Often seems like is not listening.  Dislikes activities that require sustained attention (like chores or homework).  Hyperactive-impulsive  Feels like it is impossible to sit still or stay in a seat. Fidgeting with hands and feet.  Trouble waiting turn.  Talking too much or out of turn. Interruptive.  Speaks or acts impulsively.  Aggressive, disruptive behavior.  Constantly busy or on the go, noisy.  Combined  Has symptoms of both of the above. Often children with ADHD feel discouraged about themselves and with school. They often perform well below their abilities in school. These symptoms can cause problems in home, school, and in relationships with peers. As children get older, the excess motor activities can calm down, but the problems with paying attention and staying organized persist. Most children do not outgrow ADHD but with good treatment can learn to cope with the symptoms. DIAGNOSIS  When ADHD is suspected, the diagnosis should be made by professionals trained in ADHD.  Diagnosis will  include:  Ruling out other reasons for the child's behavior.  The caregivers will check with the child's school and check their medical records.  They will talk to teachers and parents.  Behavior rating scales for the child will be filled out by those dealing with the child on a daily basis. A diagnosis is made only after all information has been considered. TREATMENT  Treatment usually includes behavioral treatment often along with medicines. It may include stimulant medicines. The stimulant medicines decrease impulsivity and hyperactivity and increase attention. Other medicines used include antidepressants and certain blood pressure medicines. Most experts agree that treatment for ADHD should address all aspects of the child's functioning. Treatment should not be limited to the use of medicines alone. Treatment should include structured classroom management. The parents must receive education to address rewarding good behavior, discipline, and limit-setting. Tutoring or behavioral therapy or both should be available for the child. If untreated, the disorder can have long-term serious effects into adolescence and adulthood. HOME CARE INSTRUCTIONS   Often with ADHD there is a lot of frustration among the family in dealing with the illness. There is often blame and anger that is not warranted. This is a life long illness. There is no way to prevent ADHD. In many cases, because the problem affects the family as a whole, the entire family may need help. A therapist can help the family find better ways to handle the disruptive behaviors and promote change. If the child is young, most of the therapist's work is with the parents. Parents will learn techniques for coping with and improving their child's behavior. Sometimes only the child with the ADHD needs counseling. Your caregivers can help   you make these decisions.  Children with ADHD may need help in organizing. Some helpful tips include:  Keep  routines the same every day from wake-up time to bedtime. Schedule everything. This includes homework and playtime. This should include outdoor and indoor recreation. Keep the schedule on the refrigerator or a bulletin board where it is frequently seen. Mark schedule changes as far in advance as possible.  Have a place for everything and keep everything in its place. This includes clothing, backpacks, and school supplies.  Encourage writing down assignments and bringing home needed books.  Offer your child a well-balanced diet. Breakfast is especially important for school performance. Children should avoid drinks with caffeine including:  Soft drinks.  Coffee.  Tea.  However, some older children (adolescents) may find these drinks helpful in improving their attention.  Children with ADHD need consistent rules that they can understand and follow. If rules are followed, give small rewards. Children with ADHD often receive, and expect, criticism. Look for good behavior and praise it. Set realistic goals. Give clear instructions. Look for activities that can foster success and self-esteem. Make time for pleasant activities with your child. Give lots of affection.  Parents are their children's greatest advocates. Learn as much as possible about ADHD. This helps you become a stronger and better advocate for your child. It also helps you educate your child's teachers and instructors if they feel inadequate in these areas. Parent support groups are often helpful. A national group with local chapters is called CHADD (Children and Adults with Attention Deficit Hyperactivity Disorder). PROGNOSIS  There is no cure for ADHD. Children with the disorder seldom outgrow it. Many find adaptive ways to accommodate the ADHD as they mature. SEEK MEDICAL CARE IF:  Your child has repeated muscle twitches, cough or speech outbursts.  Your child has sleep problems.  Your child has a marked loss of  appetite.  Your child develops depression.  Your child has new or worsening behavioral problems.  Your child develops dizziness.  Your child has a racing heart.  Your child has stomach pains.  Your child develops headaches. Document Released: 09/22/2002 Document Revised: 12/25/2011 Document Reviewed: 05/04/2008 [employer] Patient Information 2013 [employer].  

## 2012-09-26 NOTE — Telephone Encounter (Signed)
Patient has not been seen in 1 year so I scheuled her a follow up today at 3pm along with her daughter.      KP

## 2012-09-26 NOTE — Progress Notes (Signed)
  Subjective:    Patient ID: Jennifer Stokes, female    DOB: 1956/07/20, 56 y.o.   MRN: [id number]  HPI Pt here for med refill add meds.  No complaints.  Med working well   Review of Systems As above    Objective:   Physical Exam  BP 110/70  Pulse 80  Temp 98.6 F (37 C) (Oral)  Wt 137 lb (62.143 kg)  SpO2 97% General appearance: alert, cooperative and appears stated age Psych-- normal       Assessment & Plan:  Add-- refill med,  rto 6 months

## 2012-09-26 NOTE — Telephone Encounter (Signed)
Ok to refill 

## 2012-10-04 ENCOUNTER — Encounter: Payer: BC Managed Care – PPO | Admitting: Family Medicine

## 2012-10-14 ENCOUNTER — Telehealth: Payer: Self-pay | Admitting: Family Medicine

## 2012-10-14 NOTE — Telephone Encounter (Signed)
[place] Fax: [phone] From: [facility] Date/ Time: 10/13/2012 2:56 PM Taken By: Terance Ice, RN Caller: Jeannelle Facility: Not Collected Patient: Jennifer Stokes, Jennifer Stokes DOB: 18-Sep-1956 Phone: [phone] Reason for Call: Caller was unable to be reached on callback - Left Message Regarding Appointment: No Appt Date: Appt Time: Unknown

## 2012-10-14 NOTE — Telephone Encounter (Signed)
Nothing documented.     KP

## 2012-10-25 ENCOUNTER — Ambulatory Visit
Admission: RE | Admit: 2012-10-25 | Discharge: 2012-10-25 | Disposition: A | Discharge status: Home / Self Care | Payer: BC Managed Care – PPO | Source: Ambulatory Visit | Attending: Family Medicine | Admitting: Family Medicine

## 2012-10-25 DIAGNOSIS — Z1231 Encounter for screening mammogram for malignant neoplasm of breast: Secondary | ICD-10-CM

## 2012-10-31 ENCOUNTER — Ambulatory Visit (INDEPENDENT_AMBULATORY_CARE_PROVIDER_SITE_OTHER): Payer: BC Managed Care – PPO | Admitting: Family Medicine

## 2012-10-31 ENCOUNTER — Other Ambulatory Visit (HOSPITAL_COMMUNITY)
Admission: RE | Admit: 2012-10-31 | Discharge: 2012-10-31 | Disposition: A | Discharge status: Home / Self Care | Payer: BC Managed Care – PPO | Source: Ambulatory Visit | Attending: Family Medicine | Admitting: Family Medicine

## 2012-10-31 ENCOUNTER — Encounter: Payer: Self-pay | Admitting: Family Medicine

## 2012-10-31 VITALS — BP 108/66 | HR 63 | Temp 97.5°F | Ht 67.0 in | Wt 135.0 lb

## 2012-10-31 DIAGNOSIS — Z0000 Encounter for general adult medical examination without abnormal findings: Secondary | ICD-10-CM

## 2012-10-31 DIAGNOSIS — F988 Other specified behavioral and emotional disorders with onset usually occurring in childhood and adolescence: Secondary | ICD-10-CM

## 2012-10-31 DIAGNOSIS — F329 Major depressive disorder, single episode, unspecified: Secondary | ICD-10-CM

## 2012-10-31 DIAGNOSIS — F3289 Other specified depressive episodes: Secondary | ICD-10-CM

## 2012-10-31 DIAGNOSIS — R29890 Loss of height: Secondary | ICD-10-CM

## 2012-10-31 DIAGNOSIS — Z01419 Encounter for gynecological examination (general) (routine) without abnormal findings: Secondary | ICD-10-CM | POA: Insufficient documentation

## 2012-10-31 LAB — POCT URINALYSIS DIPSTICK
Glucose, UA: NEGATIVE
Ketones, UA: NEGATIVE
Leukocytes, UA: NEGATIVE
Protein, UA: NEGATIVE
Urobilinogen, UA: 0.2

## 2012-10-31 LAB — BASIC METABOLIC PANEL
CO2: 30 mEq/L (ref 19–32)
Chloride: 100 mEq/L (ref 96–112)
Potassium: 3.1 mEq/L — ABNORMAL LOW (ref 3.5–5.1)
Sodium: 138 mEq/L (ref 135–145)

## 2012-10-31 LAB — CBC WITH DIFFERENTIAL/PLATELET
Basophils Relative: 0.5 % (ref 0.0–3.0)
Eosinophils Absolute: 0 10*3/uL (ref 0.0–0.7)
Eosinophils Relative: 1.1 % (ref 0.0–5.0)
HCT: 42 % (ref 36.0–46.0)
Hemoglobin: 13.9 g/dL (ref 12.0–15.0)
MCHC: 33 g/dL (ref 30.0–36.0)
MCV: 94.6 fl (ref 78.0–100.0)
Monocytes Absolute: 0.3 10*3/uL (ref 0.1–1.0)
Neutro Abs: 2.3 10*3/uL (ref 1.4–7.7)
Neutrophils Relative %: 55.4 % (ref 43.0–77.0)
RBC: 4.44 Mil/uL (ref 3.87–5.11)
WBC: 4.2 10*3/uL — ABNORMAL LOW (ref 4.5–10.5)

## 2012-10-31 LAB — HEPATIC FUNCTION PANEL
ALT: 30 U/L (ref 0–35)
Albumin: 4.4 g/dL (ref 3.5–5.2)
Bilirubin, Direct: 0.1 mg/dL (ref 0.0–0.3)
Total Protein: 7.2 g/dL (ref 6.0–8.3)

## 2012-10-31 LAB — LIPID PANEL: Triglycerides: 40 mg/dL (ref 0.0–149.0)

## 2012-10-31 LAB — TSH: TSH: 0.73 u[IU]/mL (ref 0.35–5.50)

## 2012-10-31 LAB — MICROALBUMIN / CREATININE URINE RATIO
Creatinine,U: 98.2 mg/dL
Microalb, Ur: 0.3 mg/dL (ref 0.0–1.9)

## 2012-10-31 NOTE — Assessment & Plan Note (Signed)
Stable on meds  

## 2012-10-31 NOTE — Patient Instructions (Addendum)
Preventive Care for Adults, Female A healthy lifestyle and preventive care can promote health and wellness. Preventive health guidelines for women include the following key practices.  A routine yearly physical is a good way to check with your caregiver about your health and preventive screening. It is a chance to share any concerns and updates on your health, and to receive a thorough exam.  Visit your dentist for a routine exam and preventive care every 6 months. Brush your teeth twice a day and floss once a day. Good oral hygiene prevents tooth decay and gum disease.  The frequency of eye exams is based on your age, health, family medical history, use of contact lenses, and other factors. Follow your caregiver's recommendations for frequency of eye exams.  Eat a healthy diet. Foods like vegetables, fruits, whole grains, low-fat dairy products, and lean protein foods contain the nutrients you need without too many calories. Decrease your intake of foods high in solid fats, added sugars, and salt. Eat the right amount of calories for you.Get information about a proper diet from your caregiver, if necessary.  Regular physical exercise is one of the most important things you can do for your health. Most adults should get at least 150 minutes of moderate-intensity exercise (any activity that increases your heart rate and causes you to sweat) each week. In addition, most adults need muscle-strengthening exercises on 2 or more days a week.  Maintain a healthy weight. The body mass index (BMI) is a screening tool to identify possible weight problems. It provides an estimate of body fat based on height and weight. Your caregiver can help determine your BMI, and can help you achieve or maintain a healthy weight.For adults 20 years and older:  A BMI below 18.5 is considered underweight.  A BMI of 18.5 to 24.9 is normal.  A BMI of 25 to 29.9 is considered overweight.  A BMI of 30 and above is  considered obese.  Maintain normal blood lipids and cholesterol levels by exercising and minimizing your intake of saturated fat. Eat a balanced diet with plenty of fruit and vegetables. Blood tests for lipids and cholesterol should begin at age 20 and be repeated every 5 years. If your lipid or cholesterol levels are high, you are over 50, or you are at high risk for heart disease, you may need your cholesterol levels checked more frequently.Ongoing high lipid and cholesterol levels should be treated with medicines if diet and exercise are not effective.  If you smoke, find out from your caregiver how to quit. If you do not use tobacco, do not start.  If you are pregnant, do not drink alcohol. If you are breastfeeding, be very cautious about drinking alcohol. If you are not pregnant and choose to drink alcohol, do not exceed 1 drink per day. One drink is considered to be 12 ounces (355 mL) of beer, 5 ounces (148 mL) of wine, or 1.5 ounces (44 mL) of liquor.  Avoid use of street drugs. Do not share needles with anyone. Ask for help if you need support or instructions about stopping the use of drugs.  High blood pressure causes heart disease and increases the risk of stroke. Your blood pressure should be checked at least every 1 to 2 years. Ongoing high blood pressure should be treated with medicines if weight loss and exercise are not effective.  If you are 55 to 56 years old, ask your caregiver if you should take aspirin to prevent strokes.  Diabetes   screening involves taking a blood sample to check your fasting blood sugar level. This should be done once every 3 years, after age 45, if you are within normal weight and without risk factors for diabetes. Testing should be considered at a younger age or be carried out more frequently if you are overweight and have at least 1 risk factor for diabetes.  Breast cancer screening is essential preventive care for women. You should practice "breast  self-awareness." This means understanding the normal appearance and feel of your breasts and may include breast self-examination. Any changes detected, no matter how small, should be reported to a caregiver. Women in their 20s and 30s should have a clinical breast exam (CBE) by a caregiver as part of a regular health exam every 1 to 3 years. After age 40, women should have a CBE every year. Starting at age 40, women should consider having a mammography (breast X-ray test) every year. Women who have a family history of breast cancer should talk to their caregiver about genetic screening. Women at a high risk of breast cancer should talk to their caregivers about having magnetic resonance imaging (MRI) and a mammography every year.  The Pap test is a screening test for cervical cancer. A Pap test can show cell changes on the cervix that might become cervical cancer if left untreated. A Pap test is a procedure in which cells are obtained and examined from the lower end of the uterus (cervix).  Women should have a Pap test starting at age 21.  Between ages 21 and 29, Pap tests should be repeated every 2 years.  Beginning at age 30, you should have a Pap test every 3 years as long as the past 3 Pap tests have been normal.  Some women have medical problems that increase the chance of getting cervical cancer. Talk to your caregiver about these problems. It is especially important to talk to your caregiver if a new problem develops soon after your last Pap test. In these cases, your caregiver may recommend more frequent screening and Pap tests.  The above recommendations are the same for women who have or have not gotten the vaccine for human papillomavirus (HPV).  If you had a hysterectomy for a problem that was not cancer or a condition that could lead to cancer, then you no longer need Pap tests. Even if you no longer need a Pap test, a regular exam is a good idea to make sure no other problems are  starting.  If you are between ages 65 and 70, and you have had normal Pap tests going back 10 years, you no longer need Pap tests. Even if you no longer need a Pap test, a regular exam is a good idea to make sure no other problems are starting.  If you have had past treatment for cervical cancer or a condition that could lead to cancer, you need Pap tests and screening for cancer for at least 20 years after your treatment.  If Pap tests have been discontinued, risk factors (such as a new sexual partner) need to be reassessed to determine if screening should be resumed.  The HPV test is an additional test that may be used for cervical cancer screening. The HPV test looks for the virus that can cause the cell changes on the cervix. The cells collected during the Pap test can be tested for HPV. The HPV test could be used to screen women aged 30 years and older, and should   be used in women of any age who have unclear Pap test results. After the age of 30, women should have HPV testing at the same frequency as a Pap test.  Colorectal cancer can be detected and often prevented. Most routine colorectal cancer screening begins at the age of 50 and continues through age 75. However, your caregiver may recommend screening at an earlier age if you have risk factors for colon cancer. On a yearly basis, your caregiver may provide home test kits to check for hidden blood in the stool. Use of a small camera at the end of a tube, to directly examine the colon (sigmoidoscopy or colonoscopy), can detect the earliest forms of colorectal cancer. Talk to your caregiver about this at age 50, when routine screening begins. Direct examination of the colon should be repeated every 5 to 10 years through age 75, unless early forms of pre-cancerous polyps or small growths are found.  Hepatitis C blood testing is recommended for all people born from 1945 through 1965 and any individual with known risks for hepatitis C.  Practice  safe sex. Use condoms and avoid high-risk sexual practices to reduce the spread of sexually transmitted infections (STIs). STIs include gonorrhea, chlamydia, syphilis, trichomonas, herpes, HPV, and human immunodeficiency virus (HIV). Herpes, HIV, and HPV are viral illnesses that have no cure. They can result in disability, cancer, and death. Sexually active women aged 25 and younger should be checked for chlamydia. Older women with new or multiple partners should also be tested for chlamydia. Testing for other STIs is recommended if you are sexually active and at increased risk.  Osteoporosis is a disease in which the bones lose minerals and strength with aging. This can result in serious bone fractures. The risk of osteoporosis can be identified using a bone density scan. Women ages 65 and over and women at risk for fractures or osteoporosis should discuss screening with their caregivers. Ask your caregiver whether you should take a calcium supplement or vitamin D to reduce the rate of osteoporosis.  Menopause can be associated with physical symptoms and risks. Hormone replacement therapy is available to decrease symptoms and risks. You should talk to your caregiver about whether hormone replacement therapy is right for you.  Use sunscreen with sun protection factor (SPF) of 30 or more. Apply sunscreen liberally and repeatedly throughout the day. You should seek shade when your shadow is shorter than you. Protect yourself by wearing long sleeves, pants, a wide-brimmed hat, and sunglasses year round, whenever you are outdoors.  Once a month, do a whole body skin exam, using a mirror to look at the skin on your back. Notify your caregiver of new moles, moles that have irregular borders, moles that are larger than a pencil eraser, or moles that have changed in shape or color.  Stay current with required immunizations.  Influenza. You need a dose every fall (or winter). The composition of the flu vaccine  changes each year, so being vaccinated once is not enough.  Pneumococcal polysaccharide. You need 1 to 2 doses if you smoke cigarettes or if you have certain chronic medical conditions. You need 1 dose at age 65 (or older) if you have never been vaccinated.  Tetanus, diphtheria, pertussis (Tdap, Td). Get 1 dose of Tdap vaccine if you are younger than age 65, are over 65 and have contact with an infant, are a healthcare worker, are pregnant, or simply want to be protected from whooping cough. After that, you need a Td   booster dose every 10 years. Consult your caregiver if you have not had at least 3 tetanus and diphtheria-containing shots sometime in your life or have a deep or dirty wound.  HPV. You need this vaccine if you are a woman age 26 or younger. The vaccine is given in 3 doses over 6 months.  Measles, mumps, rubella (MMR). You need at least 1 dose of MMR if you were born in 1957 or later. You may also need a second dose.  Meningococcal. If you are age 19 to 21 and a first-year college student living in a residence hall, or have one of several medical conditions, you need to get vaccinated against meningococcal disease. You may also need additional booster doses.  Zoster (shingles). If you are age 60 or older, you should get this vaccine.  Varicella (chickenpox). If you have never had chickenpox or you were vaccinated but received only 1 dose, talk to your caregiver to find out if you need this vaccine.  Hepatitis A. You need this vaccine if you have a specific risk factor for hepatitis A virus infection or you simply wish to be protected from this disease. The vaccine is usually given as 2 doses, 6 to 18 months apart.  Hepatitis B. You need this vaccine if you have a specific risk factor for hepatitis B virus infection or you simply wish to be protected from this disease. The vaccine is given in 3 doses, usually over 6 months. Preventive Services / Frequency Ages 19 to 39  Blood  pressure check.** / Every 1 to 2 years.  Lipid and cholesterol check.** / Every 5 years beginning at age 20.  Clinical breast exam.** / Every 3 years for women in their 20s and 30s.  Pap test.** / Every 2 years from ages 21 through 29. Every 3 years starting at age 30 through age 65 or 70 with a history of 3 consecutive normal Pap tests.  HPV screening.** / Every 3 years from ages 30 through ages 65 to 70 with a history of 3 consecutive normal Pap tests.  Hepatitis C blood test.** / For any individual with known risks for hepatitis C.  Skin self-exam. / Monthly.  Influenza immunization.** / Every year.  Pneumococcal polysaccharide immunization.** / 1 to 2 doses if you smoke cigarettes or if you have certain chronic medical conditions.  Tetanus, diphtheria, pertussis (Tdap, Td) immunization. / A one-time dose of Tdap vaccine. After that, you need a Td booster dose every 10 years.  HPV immunization. / 3 doses over 6 months, if you are 26 and younger.  Measles, mumps, rubella (MMR) immunization. / You need at least 1 dose of MMR if you were born in 1957 or later. You may also need a second dose.  Meningococcal immunization. / 1 dose if you are age 19 to 21 and a first-year college student living in a residence hall, or have one of several medical conditions, you need to get vaccinated against meningococcal disease. You may also need additional booster doses.  Varicella immunization.** / Consult your caregiver.  Hepatitis A immunization.** / Consult your caregiver. 2 doses, 6 to 18 months apart.  Hepatitis B immunization.** / Consult your caregiver. 3 doses usually over 6 months. Ages 40 to 64  Blood pressure check.** / Every 1 to 2 years.  Lipid and cholesterol check.** / Every 5 years beginning at age 20.  Clinical breast exam.** / Every year after age 40.  Mammogram.** / Every year beginning at age 40   and continuing for as long as you are in good health. Consult with your  caregiver.  Pap test.** / Every 3 years starting at age 30 through age 65 or 70 with a history of 3 consecutive normal Pap tests.  HPV screening.** / Every 3 years from ages 30 through ages 65 to 70 with a history of 3 consecutive normal Pap tests.  Fecal occult blood test (FOBT) of stool. / Every year beginning at age 50 and continuing until age 75. You may not need to do this test if you get a colonoscopy every 10 years.  Flexible sigmoidoscopy or colonoscopy.** / Every 5 years for a flexible sigmoidoscopy or every 10 years for a colonoscopy beginning at age 50 and continuing until age 75.  Hepatitis C blood test.** / For all people born from 1945 through 1965 and any individual with known risks for hepatitis C.  Skin self-exam. / Monthly.  Influenza immunization.** / Every year.  Pneumococcal polysaccharide immunization.** / 1 to 2 doses if you smoke cigarettes or if you have certain chronic medical conditions.  Tetanus, diphtheria, pertussis (Tdap, Td) immunization.** / A one-time dose of Tdap vaccine. After that, you need a Td booster dose every 10 years.  Measles, mumps, rubella (MMR) immunization. / You need at least 1 dose of MMR if you were born in 1957 or later. You may also need a second dose.  Varicella immunization.** / Consult your caregiver.  Meningococcal immunization.** / Consult your caregiver.  Hepatitis A immunization.** / Consult your caregiver. 2 doses, 6 to 18 months apart.  Hepatitis B immunization.** / Consult your caregiver. 3 doses, usually over 6 months. Ages 65 and over  Blood pressure check.** / Every 1 to 2 years.  Lipid and cholesterol check.** / Every 5 years beginning at age 20.  Clinical breast exam.** / Every year after age 40.  Mammogram.** / Every year beginning at age 40 and continuing for as long as you are in good health. Consult with your caregiver.  Pap test.** / Every 3 years starting at age 30 through age 65 or 70 with a 3  consecutive normal Pap tests. Testing can be stopped between 65 and 70 with 3 consecutive normal Pap tests and no abnormal Pap or HPV tests in the past 10 years.  HPV screening.** / Every 3 years from ages 30 through ages 65 or 70 with a history of 3 consecutive normal Pap tests. Testing can be stopped between 65 and 70 with 3 consecutive normal Pap tests and no abnormal Pap or HPV tests in the past 10 years.  Fecal occult blood test (FOBT) of stool. / Every year beginning at age 50 and continuing until age 75. You may not need to do this test if you get a colonoscopy every 10 years.  Flexible sigmoidoscopy or colonoscopy.** / Every 5 years for a flexible sigmoidoscopy or every 10 years for a colonoscopy beginning at age 50 and continuing until age 75.  Hepatitis C blood test.** / For all people born from 1945 through 1965 and any individual with known risks for hepatitis C.  Osteoporosis screening.** / A one-time screening for women ages 65 and over and women at risk for fractures or osteoporosis.  Skin self-exam. / Monthly.  Influenza immunization.** / Every year.  Pneumococcal polysaccharide immunization.** / 1 dose at age 65 (or older) if you have never been vaccinated.  Tetanus, diphtheria, pertussis (Tdap, Td) immunization. / A one-time dose of Tdap vaccine if you are over   65 and have contact with an infant, are a healthcare worker, or simply want to be protected from whooping cough. After that, you need a Td booster dose every 10 years.  Varicella immunization.** / Consult your caregiver.  Meningococcal immunization.** / Consult your caregiver.  Hepatitis A immunization.** / Consult your caregiver. 2 doses, 6 to 18 months apart.  Hepatitis B immunization.** / Check with your caregiver. 3 doses, usually over 6 months. ** Family history and personal history of risk and conditions may change your caregiver's recommendations. Document Released: 11/28/2001 Document Revised: 12/25/2011  Document Reviewed: 02/27/2011 [employer] Patient Information 2013 [employer].  

## 2012-10-31 NOTE — Assessment & Plan Note (Signed)
Cont zoloft 

## 2012-10-31 NOTE — Progress Notes (Signed)
Subjective:     Jennifer Stokes is a 57 y.o. female and is here for a comprehensive physical exam. The patient reports no problems.  History   Social History  . Marital Status: Married    Spouse Name: N/A    Number of Children: N/A  . Years of Education: N/A   Occupational History  . financial planner    Social History Main Topics  . Smoking status: Never Smoker   . Smokeless tobacco: Never Used  . Alcohol Use: No  . Drug Use: No  . Sexually Active: Yes -- Female partner(s)   Other Topics Concern  . Not on file   Social History Narrative  . No narrative on file   Health Maintenance  Topic Date Due  . Influenza Vaccine  06/16/2013  . Pap Smear  10/02/2014  . Mammogram  10/25/2014  . Colonoscopy  10/03/2019  . Tetanus/tdap  10/02/2021    The following portions of the patient's history were reviewed and updated as appropriate:  She  has a past medical history of ADD (attention deficit disorder). She  does not have any pertinent problems on file. She  has past surgical history that includes Cosmetic surgery. Her family history is negative for Diabetes, and Heart disease, and Hyperlipidemia, and Hypertension, and Stroke, . She  reports that she has never smoked. She has never used smokeless tobacco. She reports that she does not drink alcohol or use illicit drugs. She has a current medication list which includes the following prescription(s): methylphenidate and sertraline. Current Outpatient Prescriptions on File Prior to Visit  Medication Sig Dispense Refill  . methylphenidate (RITALIN) 20 MG tablet Take 1 tablet (20 mg total) by mouth 2 (two) times daily.  180 tablet  0  . sertraline (ZOLOFT) 25 MG tablet Take 1 tablet (25 mg total) by mouth daily.  90 tablet  3   She  has no known allergies..  Review of Systems Review of Systems  Constitutional: Negative for activity change, appetite change and fatigue.  HENT: Negative for hearing loss, congestion, tinnitus and ear  discharge.  dentist q35m Eyes: Negative for visual disturbance (see optho q1y -- vision corrected to 20/20 with glasses).  Respiratory: Negative for cough, chest tightness and shortness of breath.   Cardiovascular: Negative for chest pain, palpitations and leg swelling.  Gastrointestinal: Negative for abdominal pain, diarrhea, constipation and abdominal distention.  Genitourinary: Negative for urgency, frequency, decreased urine volume and difficulty urinating.  Musculoskeletal: Negative for back pain, arthralgias and gait problem.  Skin: Negative for color change, pallor and rash.  Neurological: Negative for dizziness, light-headedness, numbness and headaches.  Hematological: Negative for adenopathy. Does not bruise/bleed easily.  Psychiatric/Behavioral: Negative for suicidal ideas, confusion, sleep disturbance, self-injury, dysphoric mood, decreased concentration and agitation.      Objective:    BP 108/66  Pulse 63  Temp 97.5 F (36.4 C) (Oral)  Ht 5\' 7"  (1.702 m)  Wt 135 lb (61.236 kg)  BMI 21.14 kg/m2  SpO2 99% General appearance: alert, cooperative, appears stated age and no distress Head: Normocephalic, without obvious abnormality, atraumatic Eyes: conjunctivae/corneas clear. PERRL, EOM's intact. Fundi benign. Ears: normal TM's and external ear canals both ears Nose: Nares normal. Septum midline. Mucosa normal. No drainage or sinus tenderness. Throat: lips, mucosa, and tongue normal; teeth and gums normal Neck: no adenopathy, no carotid bruit, no JVD, supple, symmetrical, trachea midline and thyroid not enlarged, symmetric, no tenderness/mass/nodules Back: symmetric, no curvature. ROM normal. No CVA tenderness. Lungs: clear  to auscultation bilaterally Breasts: normal appearance, no masses or tenderness Heart: regular rate and rhythm, S1, S2 normal, no murmur, click, rub or gallop Abdomen: soft, non-tender; bowel sounds normal; no masses,  no organomegaly Pelvic: cervix  normal in appearance, external genitalia normal, no adnexal masses or tenderness, no cervical motion tenderness, rectovaginal septum normal, uterus normal size, shape, and consistency and vagina normal without discharge Extremities: extremities normal, atraumatic, no cyanosis or edema Pulses: 2+ and symmetric Skin: Skin color, texture, turgor normal. No rashes or lesions Lymph nodes: Cervical, supraclavicular, and axillary nodes normal. Neurologic: Grossly normal psych-- no anxiety / depression-- treated    Assessment:    Healthy female exam.      Plan:    ghm utd Check labs See After Visit Summary for Counseling Recommendations

## 2012-11-15 ENCOUNTER — Ambulatory Visit
Admission: RE | Admit: 2012-11-15 | Discharge: 2012-11-15 | Disposition: A | Discharge status: Home / Self Care | Payer: BC Managed Care – PPO | Source: Ambulatory Visit | Attending: Family Medicine | Admitting: Family Medicine

## 2012-11-15 DIAGNOSIS — R29890 Loss of height: Secondary | ICD-10-CM

## 2012-12-12 ENCOUNTER — Encounter: Payer: Self-pay | Admitting: Family Medicine

## 2013-01-01 ENCOUNTER — Encounter: Payer: Self-pay | Admitting: Family Medicine

## 2013-01-08 ENCOUNTER — Other Ambulatory Visit: Payer: Self-pay | Admitting: Dermatology

## 2013-02-03 ENCOUNTER — Other Ambulatory Visit: Payer: Self-pay

## 2013-02-03 DIAGNOSIS — F988 Other specified behavioral and emotional disorders with onset usually occurring in childhood and adolescence: Secondary | ICD-10-CM

## 2013-02-03 MED ORDER — METHYLPHENIDATE HCL 20 MG PO TABS: 20.0000 mg | ORAL_TABLET | Freq: Two times a day (BID) | ORAL | Status: DC

## 2013-02-03 NOTE — Telephone Encounter (Signed)
Rx request for Ritalin.   Msg left Rx ready for pick up.      KP

## 2013-08-21 ENCOUNTER — Other Ambulatory Visit: Payer: Self-pay

## 2013-12-03 ENCOUNTER — Encounter: Payer: BC Managed Care – PPO | Admitting: Family Medicine

## 2014-03-12 ENCOUNTER — Other Ambulatory Visit: Payer: Self-pay

## 2014-03-12 ENCOUNTER — Telehealth: Payer: Self-pay

## 2014-03-12 DIAGNOSIS — Z1231 Encounter for screening mammogram for malignant neoplasm of breast: Secondary | ICD-10-CM

## 2014-03-12 NOTE — Telephone Encounter (Signed)
Medication and allergies:  Reviewed and updated  90 day supply/mail order:  [facility] - [place], [place] - [address] Local pharmacy:  [id number] - [place], [place] - [address]   Immunizations due:  UTD   A/P: No changes to personal, family history or past surgical hx Pap- 10/31/12-atrophy otw normal  CCS- 10/02/09- normal MMG- 10/25/12-negative  BD- 11/15/12- osteopenia  Flu- 07/2013--received at [facility]--per patient Td- 10/03/11  To Discuss with Provider: Nothing at this time.

## 2014-03-12 NOTE — Telephone Encounter (Signed)
Left message for call back Identifiable  Pap- 10/31/12-atrophy otw normal [facility]CS- 10/02/09 MMG- 10/25/12-negative BD- 11/15/12- osteopenia Flu- 06/16/12 Td- 10/03/11

## 2014-03-13 ENCOUNTER — Encounter: Payer: Self-pay | Admitting: Family Medicine

## 2014-03-13 ENCOUNTER — Ambulatory Visit (INDEPENDENT_AMBULATORY_CARE_PROVIDER_SITE_OTHER): Payer: BC Managed Care – PPO | Admitting: Family Medicine

## 2014-03-13 VITALS — BP 90/60 | HR 83 | Temp 98.3°F | Ht 67.25 in | Wt 134.6 lb

## 2014-03-13 DIAGNOSIS — F32A Depression, unspecified: Secondary | ICD-10-CM

## 2014-03-13 DIAGNOSIS — F3289 Other specified depressive episodes: Secondary | ICD-10-CM

## 2014-03-13 DIAGNOSIS — Z0000 Encounter for general adult medical examination without abnormal findings: Secondary | ICD-10-CM

## 2014-03-13 DIAGNOSIS — F329 Major depressive disorder, single episode, unspecified: Secondary | ICD-10-CM

## 2014-03-13 LAB — POCT URINALYSIS DIPSTICK
Bilirubin, UA: NEGATIVE
Glucose, UA: NEGATIVE
Ketones, UA: NEGATIVE
Leukocytes, UA: NEGATIVE
Nitrite, UA: NEGATIVE
PROTEIN UA: NEGATIVE
RBC UA: NEGATIVE
SPEC GRAV UA: 1.015
Urobilinogen, UA: 0.2
pH, UA: 6.5

## 2014-03-13 LAB — HEPATIC FUNCTION PANEL
ALT: 28 U/L (ref 0–35)
AST: 27 U/L (ref 0–37)
Albumin: 4.2 g/dL (ref 3.5–5.2)
Alkaline Phosphatase: 81 U/L (ref 39–117)
BILIRUBIN TOTAL: 0.8 mg/dL (ref 0.2–1.2)
Bilirubin, Direct: 0 mg/dL (ref 0.0–0.3)
Total Protein: 6.9 g/dL (ref 6.0–8.3)

## 2014-03-13 LAB — CBC WITH DIFFERENTIAL/PLATELET
BASOS ABS: 0 10*3/uL (ref 0.0–0.1)
Basophils Relative: 0.2 % (ref 0.0–3.0)
Eosinophils Absolute: 0.1 10*3/uL (ref 0.0–0.7)
Eosinophils Relative: 1.3 % (ref 0.0–5.0)
HEMATOCRIT: 41 % (ref 36.0–46.0)
Hemoglobin: 13.8 g/dL (ref 12.0–15.0)
LYMPHS ABS: 1.5 10*3/uL (ref 0.7–4.0)
Lymphocytes Relative: 33.2 % (ref 12.0–46.0)
MCHC: 33.6 g/dL (ref 30.0–36.0)
MCV: 95.3 fl (ref 78.0–100.0)
MONO ABS: 0.3 10*3/uL (ref 0.1–1.0)
Monocytes Relative: 7.1 % (ref 3.0–12.0)
Neutro Abs: 2.6 10*3/uL (ref 1.4–7.7)
Neutrophils Relative %: 58.2 % (ref 43.0–77.0)
PLATELETS: 237 10*3/uL (ref 150.0–400.0)
RBC: 4.3 Mil/uL (ref 3.87–5.11)
RDW: 13.3 % (ref 11.5–15.5)
WBC: 4.5 10*3/uL (ref 4.0–10.5)

## 2014-03-13 LAB — BASIC METABOLIC PANEL
BUN: 17 mg/dL (ref 6–23)
CHLORIDE: 104 meq/L (ref 96–112)
CO2: 30 mEq/L (ref 19–32)
CREATININE: 0.8 mg/dL (ref 0.4–1.2)
Calcium: 9.4 mg/dL (ref 8.4–10.5)
GFR: 83.37 mL/min (ref >60.00)
Glucose, Bld: 84 mg/dL (ref 70–99)
POTASSIUM: 3.5 meq/L (ref 3.5–5.1)
Sodium: 140 mEq/L (ref 135–145)

## 2014-03-13 LAB — LIPID PANEL
Cholesterol: 226 mg/dL — ABNORMAL HIGH (ref 0–200)
HDL: 113.1 mg/dL (ref >39.00)
LDL CALC: 106 mg/dL — AB (ref 0–99)
TRIGLYCERIDES: 33 mg/dL (ref 0.0–149.0)
Total CHOL/HDL Ratio: 2
VLDL: 6.6 mg/dL (ref 0.0–40.0)

## 2014-03-13 LAB — TSH: TSH: 0.52 u[IU]/mL (ref 0.35–4.50)

## 2014-03-13 MED ORDER — SERTRALINE HCL 25 MG PO TABS: 25.0000 mg | ORAL_TABLET | Freq: Every day | ORAL | Status: DC

## 2014-03-13 NOTE — Progress Notes (Signed)
Subjective:     Jennifer Stokes is a 58 y.o. female and is here for a comprehensive physical exam. The patient reports no problems.  History   Social History  . Marital Status: Married    Spouse Name: N/A    Number of Children: N/A  . Years of Education: N/A   Occupational History  . financial planner    Social History Main Topics  . Smoking status: Never Smoker   . Smokeless tobacco: Never Used  . Alcohol Use: No  . Drug Use: No  . Sexual Activity: Yes    Partners: Male   Other Topics Concern  . Not on file   Social History Narrative  . No narrative on file   Health Maintenance  Topic Date Due  . Influenza Vaccine  05/16/2014  . Mammogram  10/25/2014  . Pap Smear  11/01/2015  . Colonoscopy  10/03/2019  . Tetanus/tdap  10/02/2021    The following portions of the patient's history were reviewed and updated as appropriate:  She  has a past medical history of ADD (attention deficit disorder). She  does not have any pertinent problems on file. She  has past surgical history that includes Cosmetic surgery. Her family history includes Bipolar disorder in her son. There is no history of Diabetes, Heart disease, Hyperlipidemia, Hypertension, or Stroke. She  reports that she has never smoked. She has never used smokeless tobacco. She reports that she does not drink alcohol or use illicit drugs. She has a current medication list which includes the following prescription(s): methylphenidate and sertraline. No current outpatient prescriptions on file prior to visit.   No current facility-administered medications on file prior to visit.   She has No Known Allergies..  Review of Systems Review of Systems  Constitutional: Negative for activity change, appetite change and fatigue.  HENT: Negative for hearing loss, congestion, tinnitus and ear discharge.  dentist q6m Eyes: Negative for visual disturbance (see optho q1y -- vision corrected to 20/20 with glasses).  Respiratory:  Negative for cough, chest tightness and shortness of breath.   Cardiovascular: Negative for chest pain, palpitations and leg swelling.  Gastrointestinal: Negative for abdominal pain, diarrhea, constipation and abdominal distention.  Genitourinary: Negative for urgency, frequency, decreased urine volume and difficulty urinating.  Musculoskeletal: Negative for back pain, arthralgias and gait problem.  Skin: Negative for color change, pallor and rash.  Neurological: Negative for dizziness, light-headedness, numbness and headaches.  Hematological: Negative for adenopathy. Does not bruise/bleed easily.  Psychiatric/Behavioral: Negative for suicidal ideas, confusion, sleep disturbance, self-injury, dysphoric mood, decreased concentration and agitation.       Objective:    BP 90/60  Pulse 83  Temp(Src) 98.3 F (36.8 C) (Oral)  Ht 5' 7.25" (1.708 m)  Wt 134 lb 9.6 oz (61.054 kg)  BMI 20.93 kg/m2  SpO2 97% General appearance: alert, cooperative, appears stated age and no distress Head: Normocephalic, without obvious abnormality, atraumatic Eyes: conjunctivae/corneas clear. PERRL, EOM's intact. Fundi benign. Ears: normal TM's and external ear canals both ears Nose: Nares normal. Septum midline. Mucosa normal. No drainage or sinus tenderness. Throat: lips, mucosa, and tongue normal; teeth and gums normal Neck: no adenopathy, no carotid bruit, no JVD, supple, symmetrical, trachea midline and thyroid not enlarged, symmetric, no tenderness/mass/nodules Back: symmetric, no curvature. ROM normal. No CVA tenderness. Lungs: clear to auscultation bilaterally Breasts: normal appearance, no masses or tenderness Heart: S1, S2 normal Abdomen: soft, non-tender; bowel sounds normal; no masses,  no organomegaly Pelvic: deferred Extremities: extremities normal,  atraumatic, no cyanosis or edema Pulses: 2+ and symmetric Skin: Skin color, texture, turgor normal. No rashes or lesions Lymph nodes: Cervical,  supraclavicular, and axillary nodes normal. Neurologic: Alert and oriented X 3, normal strength and tone. Normal symmetric reflexes. Normal coordination and gait Psych-- no depression, no anxiety   Assessment:    Healthy female exam.      Plan:    ghm utd Check labs See After Visit Summary for Counseling Recommendations   1. Depression   - sertraline (ZOLOFT) 25 MG tablet; Take 1 tablet (25 mg total) by mouth daily.  Dispense: 90 tablet; Refill: 3  2. Preventative health care   - Basic metabolic panel - CBC with Differential - Hepatic function panel - Lipid panel - POCT urinalysis dipstick - TSH

## 2014-03-13 NOTE — Progress Notes (Signed)
Pre visit review using our clinic review tool, if applicable. No additional management support is needed unless otherwise documented below in the visit note. 

## 2014-03-13 NOTE — Patient Instructions (Signed)

## 2014-03-20 ENCOUNTER — Ambulatory Visit
Admission: RE | Admit: 2014-03-20 | Discharge: 2014-03-20 | Disposition: A | Discharge status: Home / Self Care | Payer: BC Managed Care – PPO | Source: Ambulatory Visit

## 2014-03-20 DIAGNOSIS — Z1231 Encounter for screening mammogram for malignant neoplasm of breast: Secondary | ICD-10-CM

## 2014-03-23 ENCOUNTER — Encounter: Payer: Self-pay | Admitting: Family Medicine

## 2014-03-23 ENCOUNTER — Encounter: Payer: Self-pay | Admitting: Obstetrics and Gynecology

## 2015-03-16 ENCOUNTER — Encounter: Payer: BC Managed Care – PPO | Admitting: Family Medicine

## 2015-03-19 ENCOUNTER — Telehealth: Payer: Self-pay | Admitting: Family Medicine

## 2015-03-19 NOTE — Telephone Encounter (Signed)
Pre Visit letter sent  °

## 2015-04-08 ENCOUNTER — Ambulatory Visit (INDEPENDENT_AMBULATORY_CARE_PROVIDER_SITE_OTHER): Payer: BLUE CROSS/BLUE SHIELD | Admitting: Family Medicine

## 2015-04-08 ENCOUNTER — Other Ambulatory Visit (HOSPITAL_COMMUNITY)
Admission: RE | Admit: 2015-04-08 | Discharge: 2015-04-08 | Disposition: A | Discharge status: Home / Self Care | Payer: BLUE CROSS/BLUE SHIELD | Source: Ambulatory Visit | Attending: Family Medicine | Admitting: Family Medicine

## 2015-04-08 ENCOUNTER — Encounter: Payer: Self-pay | Admitting: Family Medicine

## 2015-04-08 VITALS — BP 92/60 | HR 84 | Temp 98.3°F | Resp 18 | Ht 67.25 in | Wt 134.5 lb

## 2015-04-08 DIAGNOSIS — Z124 Encounter for screening for malignant neoplasm of cervix: Secondary | ICD-10-CM | POA: Diagnosis not present

## 2015-04-08 DIAGNOSIS — Z0000 Encounter for general adult medical examination without abnormal findings: Secondary | ICD-10-CM | POA: Diagnosis not present

## 2015-04-08 DIAGNOSIS — Z01419 Encounter for gynecological examination (general) (routine) without abnormal findings: Secondary | ICD-10-CM | POA: Insufficient documentation

## 2015-04-08 DIAGNOSIS — Z1239 Encounter for other screening for malignant neoplasm of breast: Secondary | ICD-10-CM | POA: Diagnosis not present

## 2015-04-08 LAB — POCT URINALYSIS DIPSTICK
Bilirubin, UA: NEGATIVE
Glucose, UA: NEGATIVE
Ketones, UA: NEGATIVE
Leukocytes, UA: NEGATIVE
Nitrite, UA: NEGATIVE
PROTEIN UA: NEGATIVE
RBC UA: NEGATIVE
SPEC GRAV UA: 1.025
Urobilinogen, UA: 0.2
pH, UA: 6

## 2015-04-08 NOTE — Patient Instructions (Signed)
Preventive Care for Adults A healthy lifestyle and preventive care can promote health and wellness. Preventive health guidelines for women include the following key practices.  A routine yearly physical is a good way to check with your health care provider about your health and preventive screening. It is a chance to share any concerns and updates on your health and to receive a thorough exam.  Visit your dentist for a routine exam and preventive care every 6 months. Brush your teeth twice a day and floss once a day. Good oral hygiene prevents tooth decay and gum disease.  The frequency of eye exams is based on your age, health, family medical history, use of contact lenses, and other factors. Follow your health care provider's recommendations for frequency of eye exams.  Eat a healthy diet. Foods like vegetables, fruits, whole grains, low-fat dairy products, and lean protein foods contain the nutrients you need without too many calories. Decrease your intake of foods high in solid fats, added sugars, and salt. Eat the right amount of calories for you.Get information about a proper diet from your health care provider, if necessary.  Regular physical exercise is one of the most important things you can do for your health. Most adults should get at least 150 minutes of moderate-intensity exercise (any activity that increases your heart rate and causes you to sweat) each week. In addition, most adults need muscle-strengthening exercises on 2 or more days a week.  Maintain a healthy weight. The body mass index (BMI) is a screening tool to identify possible weight problems. It provides an estimate of body fat based on height and weight. Your health care provider can find your BMI and can help you achieve or maintain a healthy weight.For adults 20 years and older:  A BMI below 18.5 is considered underweight.  A BMI of 18.5 to 24.9 is normal.  A BMI of 25 to 29.9 is considered overweight.  A BMI of  30 and above is considered obese.  Maintain normal blood lipids and cholesterol levels by exercising and minimizing your intake of saturated fat. Eat a balanced diet with plenty of fruit and vegetables. Blood tests for lipids and cholesterol should begin at age 76 and be repeated every 5 years. If your lipid or cholesterol levels are high, you are over 50, or you are at high risk for heart disease, you may need your cholesterol levels checked more frequently.Ongoing high lipid and cholesterol levels should be treated with medicines if diet and exercise are not working.  If you smoke, find out from your health care provider how to quit. If you do not use tobacco, do not start.  Lung cancer screening is recommended for adults aged 22-80 years who are at high risk for developing lung cancer because of a history of smoking. A yearly low-dose CT scan of the lungs is recommended for people who have at least a 30-pack-year history of smoking and are a current smoker or have quit within the past 15 years. A pack year of smoking is smoking an average of 1 pack of cigarettes a day for 1 year (for example: 1 pack a day for 30 years or 2 packs a day for 15 years). Yearly screening should continue until the smoker has stopped smoking for at least 15 years. Yearly screening should be stopped for people who develop a health problem that would prevent them from having lung cancer treatment.  If you are pregnant, do not drink alcohol. If you are breastfeeding,  be very cautious about drinking alcohol. If you are not pregnant and choose to drink alcohol, do not have more than 1 drink per day. One drink is considered to be 12 ounces (355 mL) of beer, 5 ounces (148 mL) of wine, or 1.5 ounces (44 mL) of liquor.  Avoid use of street drugs. Do not share needles with anyone. Ask for help if you need support or instructions about stopping the use of drugs.  High blood pressure causes heart disease and increases the risk of  stroke. Your blood pressure should be checked at least every 1 to 2 years. Ongoing high blood pressure should be treated with medicines if weight loss and exercise do not work.  If you are 75-52 years old, ask your health care provider if you should take aspirin to prevent strokes.  Diabetes screening involves taking a blood sample to check your fasting blood sugar level. This should be done once every 3 years, after age 15, if you are within normal weight and without risk factors for diabetes. Testing should be considered at a younger age or be carried out more frequently if you are overweight and have at least 1 risk factor for diabetes.  Breast cancer screening is essential preventive care for women. You should practice "breast self-awareness." This means understanding the normal appearance and feel of your breasts and may include breast self-examination. Any changes detected, no matter how small, should be reported to a health care provider. Women in their 58s and 30s should have a clinical breast exam (CBE) by a health care provider as part of a regular health exam every 1 to 3 years. After age 16, women should have a CBE every year. Starting at age 53, women should consider having a mammogram (breast X-ray test) every year. Women who have a family history of breast cancer should talk to their health care provider about genetic screening. Women at a high risk of breast cancer should talk to their health care providers about having an MRI and a mammogram every year.  Breast cancer gene (BRCA)-related cancer risk assessment is recommended for women who have family members with BRCA-related cancers. BRCA-related cancers include breast, ovarian, tubal, and peritoneal cancers. Having family members with these cancers may be associated with an increased risk for harmful changes (mutations) in the breast cancer genes BRCA1 and BRCA2. Results of the assessment will determine the need for genetic counseling and  BRCA1 and BRCA2 testing.  Routine pelvic exams to screen for cancer are no longer recommended for nonpregnant women who are considered low risk for cancer of the pelvic organs (ovaries, uterus, and vagina) and who do not have symptoms. Ask your health care provider if a screening pelvic exam is right for you.  If you have had past treatment for cervical cancer or a condition that could lead to cancer, you need Pap tests and screening for cancer for at least 20 years after your treatment. If Pap tests have been discontinued, your risk factors (such as having a new sexual partner) need to be reassessed to determine if screening should be resumed. Some women have medical problems that increase the chance of getting cervical cancer. In these cases, your health care provider may recommend more frequent screening and Pap tests.  The HPV test is an additional test that may be used for cervical cancer screening. The HPV test looks for the virus that can cause the cell changes on the cervix. The cells collected during the Pap test can be  tested for HPV. The HPV test could be used to screen women aged 35 years and older, and should be used in women of any age who have unclear Pap test results. After the age of 12, women should have HPV testing at the same frequency as a Pap test.  Colorectal cancer can be detected and often prevented. Most routine colorectal cancer screening begins at the age of 34 years and continues through age 69 years. However, your health care provider may recommend screening at an earlier age if you have risk factors for colon cancer. On a yearly basis, your health care provider may provide home test kits to check for hidden blood in the stool. Use of a small camera at the end of a tube, to directly examine the colon (sigmoidoscopy or colonoscopy), can detect the earliest forms of colorectal cancer. Talk to your health care provider about this at age 61, when routine screening begins. Direct  exam of the colon should be repeated every 5-10 years through age 7 years, unless early forms of pre-cancerous polyps or small growths are found.  People who are at an increased risk for hepatitis B should be screened for this virus. You are considered at high risk for hepatitis B if:  You were born in a country where hepatitis B occurs often. Talk with your health care provider about which countries are considered high risk.  Your parents were born in a high-risk country and you have not received a shot to protect against hepatitis B (hepatitis B vaccine).  You have HIV or AIDS.  You use needles to inject street drugs.  You live with, or have sex with, someone who has hepatitis B.  You get hemodialysis treatment.  You take certain medicines for conditions like cancer, organ transplantation, and autoimmune conditions.  Hepatitis C blood testing is recommended for all people born from [birth year] through 1965 and any individual with known risks for hepatitis C.  Practice safe sex. Use condoms and avoid high-risk sexual practices to reduce the spread of sexually transmitted infections (STIs). STIs include gonorrhea, chlamydia, syphilis, trichomonas, herpes, HPV, and human immunodeficiency virus (HIV). Herpes, HIV, and HPV are viral illnesses that have no cure. They can result in disability, cancer, and death.  You should be screened for sexually transmitted illnesses (STIs) including gonorrhea and chlamydia if:  You are sexually active and are younger than 24 years.  You are older than 24 years and your health care provider tells you that you are at risk for this type of infection.  Your sexual activity has changed since you were last screened and you are at an increased risk for chlamydia or gonorrhea. Ask your health care provider if you are at risk.  If you are at risk of being infected with HIV, it is recommended that you take a prescription medicine daily to prevent HIV infection. This is  called preexposure prophylaxis (PrEP). You are considered at risk if:  You are a heterosexual woman, are sexually active, and are at increased risk for HIV infection.  You take drugs by injection.  You are sexually active with a partner who has HIV.  Talk with your health care provider about whether you are at high risk of being infected with HIV. If you choose to begin PrEP, you should first be tested for HIV. You should then be tested every 3 months for as long as you are taking PrEP.  Osteoporosis is a disease in which the bones lose minerals and strength  with aging. This can result in serious bone fractures or breaks. The risk of osteoporosis can be identified using a bone density scan. Women ages 65 years and over and women at risk for fractures or osteoporosis should discuss screening with their health care providers. Ask your health care provider whether you should take a calcium supplement or vitamin D to reduce the rate of osteoporosis.  Menopause can be associated with physical symptoms and risks. Hormone replacement therapy is available to decrease symptoms and risks. You should talk to your health care provider about whether hormone replacement therapy is right for you.  Use sunscreen. Apply sunscreen liberally and repeatedly throughout the day. You should seek shade when your shadow is shorter than you. Protect yourself by wearing long sleeves, pants, a wide-brimmed hat, and sunglasses year round, whenever you are outdoors.  Once a month, do a whole body skin exam, using a mirror to look at the skin on your back. Tell your health care provider of new moles, moles that have irregular borders, moles that are larger than a pencil eraser, or moles that have changed in shape or color.  Stay current with required vaccines (immunizations).  Influenza vaccine. All adults should be immunized every year.  Tetanus, diphtheria, and acellular pertussis (Td, Tdap) vaccine. Pregnant women should  receive 1 dose of Tdap vaccine during each pregnancy. The dose should be obtained regardless of the length of time since the last dose. Immunization is preferred during the 27th-36th week of gestation. An adult who has not previously received Tdap or who does not know her vaccine status should receive 1 dose of Tdap. This initial dose should be followed by tetanus and diphtheria toxoids (Td) booster doses every 10 years. Adults with an unknown or incomplete history of completing a 3-dose immunization series with Td-containing vaccines should begin or complete a primary immunization series including a Tdap dose. Adults should receive a Td booster every 10 years.  Varicella vaccine. An adult without evidence of immunity to varicella should receive 2 doses or a second dose if she has previously received 1 dose. Pregnant females who do not have evidence of immunity should receive the first dose after pregnancy. This first dose should be obtained before leaving the health care facility. The second dose should be obtained 4-8 weeks after the first dose.  Human papillomavirus (HPV) vaccine. Females aged 13-26 years who have not received the vaccine previously should obtain the 3-dose series. The vaccine is not recommended for use in pregnant females. However, pregnancy testing is not needed before receiving a dose. If a female is found to be pregnant after receiving a dose, no treatment is needed. In that case, the remaining doses should be delayed until after the pregnancy. Immunization is recommended for any person with an immunocompromised condition through the age of 26 years if she did not get any or all doses earlier. During the 3-dose series, the second dose should be obtained 4-8 weeks after the first dose. The third dose should be obtained 24 weeks after the first dose and 16 weeks after the second dose.  Zoster vaccine. One dose is recommended for adults aged 60 years or older unless certain conditions are  present.  Measles, mumps, and rubella (MMR) vaccine. Adults born before 1957 generally are considered immune to measles and mumps. Adults born in 1957 or later should have 1 or more doses of MMR vaccine unless there is a contraindication to the vaccine or there is laboratory evidence of immunity to   each of the three diseases. A routine second dose of MMR vaccine should be obtained at least 28 days after the first dose for students attending postsecondary schools, health care workers, or international travelers. People who received inactivated measles vaccine or an unknown type of measles vaccine during 1963-1967 should receive 2 doses of MMR vaccine. People who received inactivated mumps vaccine or an unknown type of mumps vaccine before 1979 and are at high risk for mumps infection should consider immunization with 2 doses of MMR vaccine. For females of childbearing age, rubella immunity should be determined. If there is no evidence of immunity, females who are not pregnant should be vaccinated. If there is no evidence of immunity, females who are pregnant should delay immunization until after pregnancy. Unvaccinated health care workers born before [birth year] who lack laboratory evidence of measles, mumps, or rubella immunity or laboratory confirmation of disease should consider measles and mumps immunization with 2 doses of MMR vaccine or rubella immunization with 1 dose of MMR vaccine.  Pneumococcal 13-valent conjugate (PCV13) vaccine. When indicated, a person who is uncertain of her immunization history and has no record of immunization should receive the PCV13 vaccine. An adult aged 38 years or older who has certain medical conditions and has not been previously immunized should receive 1 dose of PCV13 vaccine. This PCV13 should be followed with a dose of pneumococcal polysaccharide (PPSV23) vaccine. The PPSV23 vaccine dose should be obtained at least 8 weeks after the dose of PCV13 vaccine. An adult aged 10  years or older who has certain medical conditions and previously received 1 or more doses of PPSV23 vaccine should receive 1 dose of PCV13. The PCV13 vaccine dose should be obtained 1 or more years after the last PPSV23 vaccine dose.  Pneumococcal polysaccharide (PPSV23) vaccine. When PCV13 is also indicated, PCV13 should be obtained first. All adults aged 44 years and older should be immunized. An adult younger than age 34 years who has certain medical conditions should be immunized. Any person who resides in a nursing home or long-term care facility should be immunized. An adult smoker should be immunized. People with an immunocompromised condition and certain other conditions should receive both PCV13 and PPSV23 vaccines. People with human immunodeficiency virus (HIV) infection should be immunized as soon as possible after diagnosis. Immunization during chemotherapy or radiation therapy should be avoided. Routine use of PPSV23 vaccine is not recommended for American Indians, [place] Natives, or people younger than 65 years unless there are medical conditions that require PPSV23 vaccine. When indicated, people who have unknown immunization and have no record of immunization should receive PPSV23 vaccine. One-time revaccination 5 years after the first dose of PPSV23 is recommended for people aged 19-64 years who have chronic kidney failure, nephrotic syndrome, asplenia, or immunocompromised conditions. People who received 1-2 doses of PPSV23 before age 8 years should receive another dose of PPSV23 vaccine at age 34 years or later if at least 5 years have passed since the previous dose. Doses of PPSV23 are not needed for people immunized with PPSV23 at or after age 62 years.  Meningococcal vaccine. Adults with asplenia or persistent complement component deficiencies should receive 2 doses of quadrivalent meningococcal conjugate (MenACWY-D) vaccine. The doses should be obtained at least 2 months apart.  Microbiologists working with certain meningococcal bacteria, [place] recruits, people at risk during an outbreak, and people who travel to or live in countries with a high rate of meningitis should be immunized. A first-year college student up through age  21 years who is living in a residence hall should receive a dose if she did not receive a dose on or after her 16th birthday. Adults who have certain high-risk conditions should receive one or more doses of vaccine.  Hepatitis A vaccine. Adults who wish to be protected from this disease, have certain high-risk conditions, work with hepatitis A-infected animals, work in hepatitis A research labs, or travel to or work in countries with a high rate of hepatitis A should be immunized. Adults who were previously unvaccinated and who anticipate close contact with an international adoptee during the first 60 days after arrival in the [place] from a country with a high rate of hepatitis A should be immunized.  Hepatitis B vaccine. Adults who wish to be protected from this disease, have certain high-risk conditions, may be exposed to blood or other infectious body fluids, are household contacts or sex partners of hepatitis B positive people, are clients or workers in certain care facilities, or travel to or work in countries with a high rate of hepatitis B should be immunized.  Haemophilus influenzae type b (Hib) vaccine. A previously unvaccinated person with asplenia or sickle cell disease or having a scheduled splenectomy should receive 1 dose of Hib vaccine. Regardless of previous immunization, a recipient of a hematopoietic stem cell transplant should receive a 3-dose series 6-12 months after her successful transplant. Hib vaccine is not recommended for adults with HIV infection. Preventive Services / Frequency Ages 64 to 68 years  Blood pressure check.** / Every 1 to 2 years.  Lipid and cholesterol check.** / Every 5 years beginning at age  22.  Clinical breast exam.** / Every 3 years for women in their 88s and 53s.  BRCA-related cancer risk assessment.** / For women who have family members with a BRCA-related cancer (breast, ovarian, tubal, or peritoneal cancers).  Pap test.** / Every 2 years from ages 90 through 51. Every 3 years starting at age 21 through age 56 or 3 with a history of 3 consecutive normal Pap tests.  HPV screening.** / Every 3 years from ages 24 through ages 1 to 46 with a history of 3 consecutive normal Pap tests.  Hepatitis C blood test.** / For any individual with known risks for hepatitis C.  Skin self-exam. / Monthly.  Influenza vaccine. / Every year.  Tetanus, diphtheria, and acellular pertussis (Tdap, Td) vaccine.** / Consult your health care provider. Pregnant women should receive 1 dose of Tdap vaccine during each pregnancy. 1 dose of Td every 10 years.  Varicella vaccine.** / Consult your health care provider. Pregnant females who do not have evidence of immunity should receive the first dose after pregnancy.  HPV vaccine. / 3 doses over 6 months, if 72 and younger. The vaccine is not recommended for use in pregnant females. However, pregnancy testing is not needed before receiving a dose.  Measles, mumps, rubella (MMR) vaccine.** / You need at least 1 dose of MMR if you were born in 1957 or later. You may also need a 2nd dose. For females of childbearing age, rubella immunity should be determined. If there is no evidence of immunity, females who are not pregnant should be vaccinated. If there is no evidence of immunity, females who are pregnant should delay immunization until after pregnancy.  Pneumococcal 13-valent conjugate (PCV13) vaccine.** / Consult your health care provider.  Pneumococcal polysaccharide (PPSV23) vaccine.** / 1 to 2 doses if you smoke cigarettes or if you have certain conditions.  Meningococcal vaccine.** /  1 dose if you are age 19 to 21 years and a first-year college  student living in a residence hall, or have one of several medical conditions, you need to get vaccinated against meningococcal disease. You may also need additional booster doses.  Hepatitis A vaccine.** / Consult your health care provider.  Hepatitis B vaccine.** / Consult your health care provider.  Haemophilus influenzae type b (Hib) vaccine.** / Consult your health care provider. Ages 40 to 64 years  Blood pressure check.** / Every 1 to 2 years.  Lipid and cholesterol check.** / Every 5 years beginning at age 20 years.  Lung cancer screening. / Every year if you are aged 55-80 years and have a 30-pack-year history of smoking and currently smoke or have quit within the past 15 years. Yearly screening is stopped once you have quit smoking for at least 15 years or develop a health problem that would prevent you from having lung cancer treatment.  Clinical breast exam.** / Every year after age 40 years.  BRCA-related cancer risk assessment.** / For women who have family members with a BRCA-related cancer (breast, ovarian, tubal, or peritoneal cancers).  Mammogram.** / Every year beginning at age 40 years and continuing for as long as you are in good health. Consult with your health care provider.  Pap test.** / Every 3 years starting at age 30 years through age 65 or 70 years with a history of 3 consecutive normal Pap tests.  HPV screening.** / Every 3 years from ages 30 years through ages 65 to 70 years with a history of 3 consecutive normal Pap tests.  Fecal occult blood test (FOBT) of stool. / Every year beginning at age 50 years and continuing until age 75 years. You may not need to do this test if you get a colonoscopy every 10 years.  Flexible sigmoidoscopy or colonoscopy.** / Every 5 years for a flexible sigmoidoscopy or every 10 years for a colonoscopy beginning at age 50 years and continuing until age 75 years.  Hepatitis C blood test.** / For all people born from 1945 through  1965 and any individual with known risks for hepatitis C.  Skin self-exam. / Monthly.  Influenza vaccine. / Every year.  Tetanus, diphtheria, and acellular pertussis (Tdap/Td) vaccine.** / Consult your health care provider. Pregnant women should receive 1 dose of Tdap vaccine during each pregnancy. 1 dose of Td every 10 years.  Varicella vaccine.** / Consult your health care provider. Pregnant females who do not have evidence of immunity should receive the first dose after pregnancy.  Zoster vaccine.** / 1 dose for adults aged 60 years or older.  Measles, mumps, rubella (MMR) vaccine.** / You need at least 1 dose of MMR if you were born in 1957 or later. You may also need a 2nd dose. For females of childbearing age, rubella immunity should be determined. If there is no evidence of immunity, females who are not pregnant should be vaccinated. If there is no evidence of immunity, females who are pregnant should delay immunization until after pregnancy.  Pneumococcal 13-valent conjugate (PCV13) vaccine.** / Consult your health care provider.  Pneumococcal polysaccharide (PPSV23) vaccine.** / 1 to 2 doses if you smoke cigarettes or if you have certain conditions.  Meningococcal vaccine.** / Consult your health care provider.  Hepatitis A vaccine.** / Consult your health care provider.  Hepatitis B vaccine.** / Consult your health care provider.  Haemophilus influenzae type b (Hib) vaccine.** / Consult your health care provider. Ages 65   years and over  Blood pressure check.** / Every 1 to 2 years.  Lipid and cholesterol check.** / Every 5 years beginning at age 80 years.  Lung cancer screening. / Every year if you are aged 56-80 years and have a 30-pack-year history of smoking and currently smoke or have quit within the past 15 years. Yearly screening is stopped once you have quit smoking for at least 15 years or develop a health problem that would prevent you from having lung cancer  treatment.  Clinical breast exam.** / Every year after age 30 years.  BRCA-related cancer risk assessment.** / For women who have family members with a BRCA-related cancer (breast, ovarian, tubal, or peritoneal cancers).  Mammogram.** / Every year beginning at age 5 years and continuing for as long as you are in good health. Consult with your health care provider.  Pap test.** / Every 3 years starting at age 88 years through age 44 or 2 years with 3 consecutive normal Pap tests. Testing can be stopped between 65 and 70 years with 3 consecutive normal Pap tests and no abnormal Pap or HPV tests in the past 10 years.  HPV screening.** / Every 3 years from ages 49 years through ages 42 or 50 years with a history of 3 consecutive normal Pap tests. Testing can be stopped between 65 and 70 years with 3 consecutive normal Pap tests and no abnormal Pap or HPV tests in the past 10 years.  Fecal occult blood test (FOBT) of stool. / Every year beginning at age 19 years and continuing until age 71 years. You may not need to do this test if you get a colonoscopy every 10 years.  Flexible sigmoidoscopy or colonoscopy.** / Every 5 years for a flexible sigmoidoscopy or every 10 years for a colonoscopy beginning at age 60 years and continuing until age 19 years.  Hepatitis C blood test.** / For all people born from [birth year] through 1964 and any individual with known risks for hepatitis C.  Osteoporosis screening.** / A one-time screening for women ages 78 years and over and women at risk for fractures or osteoporosis.  Skin self-exam. / Monthly.  Influenza vaccine. / Every year.  Tetanus, diphtheria, and acellular pertussis (Tdap/Td) vaccine.** / 1 dose of Td every 10 years.  Varicella vaccine.** / Consult your health care provider.  Zoster vaccine.** / 1 dose for adults aged 17 years or older.  Pneumococcal 13-valent conjugate (PCV13) vaccine.** / Consult your health care provider.  Pneumococcal  polysaccharide (PPSV23) vaccine.** / 1 dose for all adults aged 39 years and older.  Meningococcal vaccine.** / Consult your health care provider.  Hepatitis A vaccine.** / Consult your health care provider.  Hepatitis B vaccine.** / Consult your health care provider.  Haemophilus influenzae type b (Hib) vaccine.** / Consult your health care provider. ** Family history and personal history of risk and conditions may change your health care provider's recommendations. Document Released: 11/28/2001 Document Revised: 02/16/2014 Document Reviewed: 02/27/2011 [facility] Patient Information 2015 [place], [place]. This information is not intended to replace advice given to you by your health care provider. Make sure you discuss any questions you have with your health care provider.

## 2015-04-08 NOTE — Progress Notes (Signed)
Subjective:     Jennifer Stokes is a 59 y.o. female and is here for a comprehensive physical exam. The patient reports no problems.  History   Social History  . Marital Status: Married    Spouse Name: N/A  . Number of Children: N/A  . Years of Education: N/A   Occupational History  . financial planner    Social History Main Topics  . Smoking status: Never Smoker   . Smokeless tobacco: Never Used  . Alcohol Use: No  . Drug Use: No  . Sexual Activity:    Partners: Male   Other Topics Concern  . Not on file   Social History Narrative   Health Maintenance  Topic Date Due  . INFLUENZA VACCINE  05/17/2015  . PAP SMEAR  11/01/2015  . MAMMOGRAM  03/20/2016  . TETANUS/TDAP  10/02/2021  . COLONOSCOPY  03/03/2024  . HIV Screening  Addressed    The following portions of the patient's history were reviewed and updated as appropriate:  She  has a past medical history of ADD (attention deficit disorder). She  does not have any pertinent problems on file. She  has past surgical history that includes Cosmetic surgery. Her family history includes Bipolar disorder in her son. There is no history of Diabetes, Heart disease, Hyperlipidemia, Hypertension, or Stroke. She  reports that she has never smoked. She has never used smokeless tobacco. She reports that she does not drink alcohol or use illicit drugs. She has a current medication list which includes the following prescription(s): methylphenidate and sertraline. Current Outpatient Prescriptions on File Prior to Visit  Medication Sig Dispense Refill  . methylphenidate (RITALIN) 20 MG tablet Take 20 mg by mouth 4 (four) times daily.    . sertraline (ZOLOFT) 25 MG tablet Take 1 tablet (25 mg total) by mouth daily. 90 tablet 3   No current facility-administered medications on file prior to visit.   She has No Known Allergies..  Review of Systems Review of Systems  Constitutional: Negative for activity change, appetite change and  fatigue.  HENT: Negative for hearing loss, congestion, tinnitus and ear discharge.  dentist q46m Eyes: Negative for visual disturbance (see optho q1y -- vision corrected to 20/20 with glasses).  Respiratory: Negative for cough, chest tightness and shortness of breath.   Cardiovascular: Negative for chest pain, palpitations and leg swelling.  Gastrointestinal: Negative for abdominal pain, diarrhea, constipation and abdominal distention.  Genitourinary: Negative for urgency, frequency, decreased urine volume and difficulty urinating.  Musculoskeletal: Negative for back pain, arthralgias and gait problem.  Skin: Negative for color change, pallor and rash.  Neurological: Negative for dizziness, light-headedness, numbness and headaches.  Hematological: Negative for adenopathy. Does not bruise/bleed easily.  Psychiatric/Behavioral: Negative for suicidal ideas, confusion, sleep disturbance, self-injury, dysphoric mood, decreased concentration and agitation.       Objective:    BP 92/60 mmHg  Pulse 84  Temp(Src) 98.3 F (36.8 C) (Oral)  Resp 18  Ht 5' 7.25" (1.708 m)  Wt 134 lb 8 oz (61.009 kg)  BMI 20.91 kg/m2  SpO2 99% General appearance: alert, cooperative, appears stated age and no distress Head: Normocephalic, without obvious abnormality, atraumatic Eyes: conjunctivae/corneas clear. PERRL, EOM's intact. Fundi benign. Ears: normal TM's and external ear canals both ears Nose: Nares normal. Septum midline. Mucosa normal. No drainage or sinus tenderness. Throat: lips, mucosa, and tongue normal; teeth and gums normal Neck: no adenopathy, no carotid bruit, no JVD, supple, symmetrical, trachea midline and thyroid not enlarged, symmetric,  no tenderness/mass/nodules Back: symmetric, no curvature. ROM normal. No CVA tenderness. Lungs: clear to auscultation bilaterally Breasts: normal appearance, no masses or tenderness Heart: regular rate and rhythm, S1, S2 normal, no murmur, click, rub or  gallop Abdomen: soft, non-tender; bowel sounds normal; no masses,  no organomegaly Pelvic: cervix normal in appearance, external genitalia normal, no adnexal masses or tenderness, no cervical motion tenderness, rectovaginal septum normal, uterus normal size, shape, and consistency, vagina normal without discharge and pap done, rectal heme neg brown stool Extremities: extremities normal, atraumatic, no cyanosis or edema Pulses: 2+ and symmetric Skin: Skin color, texture, turgor normal. No rashes or lesions Lymph nodes: Cervical, supraclavicular, and axillary nodes normal. Neurologic: Alert and oriented X 3, normal strength and tone. Normal symmetric reflexes. Normal coordination and gait Psych- no depression, no anxiety      Assessment:    Healthy female exam.       Plan:    ghm  utd Check labs See After Visit Summary for Counseling Recommendations

## 2015-04-08 NOTE — Progress Notes (Signed)
Pre visit review using our clinic review tool, if applicable. No additional management support is needed unless otherwise documented below in the visit note. 

## 2015-04-09 LAB — CBC WITH DIFFERENTIAL/PLATELET
BASOS PCT: 0.4 % (ref 0.0–3.0)
Basophils Absolute: 0 10*3/uL (ref 0.0–0.1)
EOS ABS: 0 10*3/uL (ref 0.0–0.7)
Eosinophils Relative: 0.6 % (ref 0.0–5.0)
HCT: 40.5 % (ref 36.0–46.0)
Hemoglobin: 13.6 g/dL (ref 12.0–15.0)
LYMPHS ABS: 1.5 10*3/uL (ref 0.7–4.0)
Lymphocytes Relative: 29 % (ref 12.0–46.0)
MCHC: 33.7 g/dL (ref 30.0–36.0)
MCV: 95.2 fl (ref 78.0–100.0)
MONO ABS: 0.4 10*3/uL (ref 0.1–1.0)
Monocytes Relative: 6.9 % (ref 3.0–12.0)
NEUTROS PCT: 63.1 % (ref 43.0–77.0)
Neutro Abs: 3.3 10*3/uL (ref 1.4–7.7)
Platelets: 233 10*3/uL (ref 150.0–400.0)
RBC: 4.25 Mil/uL (ref 3.87–5.11)
RDW: 13.1 % (ref 11.5–15.5)
WBC: 5.2 10*3/uL (ref 4.0–10.5)

## 2015-04-09 LAB — LIPID PANEL
Cholesterol: 224 mg/dL — ABNORMAL HIGH (ref 0–200)
HDL: 103.6 mg/dL (ref >39.00)
LDL CALC: 114 mg/dL — AB (ref 0–99)
NONHDL: 120.4
Total CHOL/HDL Ratio: 2
Triglycerides: 33 mg/dL (ref 0.0–149.0)
VLDL: 6.6 mg/dL (ref 0.0–40.0)

## 2015-04-09 LAB — HEPATIC FUNCTION PANEL
ALT: 28 U/L (ref 0–35)
AST: 27 U/L (ref 0–37)
Albumin: 4.4 g/dL (ref 3.5–5.2)
Alkaline Phosphatase: 91 U/L (ref 39–117)
Bilirubin, Direct: 0.1 mg/dL (ref 0.0–0.3)
TOTAL PROTEIN: 7 g/dL (ref 6.0–8.3)
Total Bilirubin: 0.7 mg/dL (ref 0.2–1.2)

## 2015-04-09 LAB — BASIC METABOLIC PANEL
BUN: 20 mg/dL (ref 6–23)
CHLORIDE: 103 meq/L (ref 96–112)
CO2: 32 mEq/L (ref 19–32)
Calcium: 9.3 mg/dL (ref 8.4–10.5)
Creatinine, Ser: 0.76 mg/dL (ref 0.40–1.20)
GFR: 83.06 mL/min (ref >60.00)
Glucose, Bld: 65 mg/dL — ABNORMAL LOW (ref 70–99)
POTASSIUM: 3.6 meq/L (ref 3.5–5.1)
Sodium: 139 mEq/L (ref 135–145)

## 2015-04-09 LAB — TSH: TSH: 1.44 u[IU]/mL (ref 0.35–4.50)

## 2015-04-12 LAB — CYTOLOGY - PAP

## 2015-05-14 ENCOUNTER — Other Ambulatory Visit: Payer: Self-pay | Admitting: Family Medicine

## 2015-05-21 ENCOUNTER — Ambulatory Visit
Admission: RE | Admit: 2015-05-21 | Discharge: 2015-05-21 | Disposition: A | Discharge status: Home / Self Care | Payer: BLUE CROSS/BLUE SHIELD | Source: Ambulatory Visit | Attending: Family Medicine | Admitting: Family Medicine

## 2015-05-21 DIAGNOSIS — Z1239 Encounter for other screening for malignant neoplasm of breast: Secondary | ICD-10-CM

## 2016-01-18 DIAGNOSIS — F419 Anxiety disorder, unspecified: Secondary | ICD-10-CM | POA: Diagnosis not present

## 2016-02-18 DIAGNOSIS — H0014 Chalazion left upper eyelid: Secondary | ICD-10-CM | POA: Diagnosis not present

## 2016-02-18 DIAGNOSIS — F419 Anxiety disorder, unspecified: Secondary | ICD-10-CM | POA: Diagnosis not present

## 2016-04-07 DIAGNOSIS — F419 Anxiety disorder, unspecified: Secondary | ICD-10-CM | POA: Diagnosis not present

## 2016-04-10 ENCOUNTER — Ambulatory Visit (INDEPENDENT_AMBULATORY_CARE_PROVIDER_SITE_OTHER): Payer: BLUE CROSS/BLUE SHIELD | Admitting: Family Medicine

## 2016-04-10 ENCOUNTER — Encounter: Payer: Self-pay | Admitting: Family Medicine

## 2016-04-10 VITALS — BP 107/66 | HR 83 | Temp 98.6°F | Ht 67.25 in | Wt 143.0 lb

## 2016-04-10 DIAGNOSIS — Z1159 Encounter for screening for other viral diseases: Secondary | ICD-10-CM

## 2016-04-10 DIAGNOSIS — Z0000 Encounter for general adult medical examination without abnormal findings: Secondary | ICD-10-CM | POA: Diagnosis not present

## 2016-04-10 NOTE — Progress Notes (Signed)
Subjective:     Jennifer Stokes is a 60 y.o. female and is here for a comprehensive physical exam. The patient reports no problems.  Social History   Social History  . Marital Status: Married    Spouse Name: N/A  . Number of Children: N/A  . Years of Education: N/A   Occupational History  . financial planner    Social History Main Topics  . Smoking status: Never Smoker   . Smokeless tobacco: Never Used  . Alcohol Use: No  . Drug Use: No  . Sexual Activity:    Partners: Male   Other Topics Concern  . Not on file   Social History Narrative   Health Maintenance  Topic Date Due  . Hepatitis C Screening  09-30-1956  . INFLUENZA VACCINE  05/16/2016  . MAMMOGRAM  05/20/2017  . PAP SMEAR  04/07/2018  . TETANUS/TDAP  10/02/2021  . COLONOSCOPY  03/03/2024  . HIV Screening  Addressed    The following portions of the patient's history were reviewed and updated as appropriate:  She  has a past medical history of ADD (attention deficit disorder). She  does not have any pertinent problems on file. She  has past surgical history that includes Cosmetic surgery. Her family history includes Bipolar disorder in her son. There is no history of Diabetes, Heart disease, Hyperlipidemia, Hypertension, or Stroke. She  reports that she has never smoked. She has never used smokeless tobacco. She reports that she does not drink alcohol or use illicit drugs. She has a current medication list which includes the following prescription(s): methylphenidate and sertraline. Current Outpatient Prescriptions on File Prior to Visit  Medication Sig Dispense Refill  . methylphenidate (RITALIN) 20 MG tablet Take 20 mg by mouth 4 (four) times daily.    . sertraline (ZOLOFT) 25 MG tablet TAKE 1 BY MOUTH DAILY 90 tablet 3   No current facility-administered medications on file prior to visit.   She has No Known Allergies..  Review of Systems Review of Systems  Constitutional: Negative for activity change,  appetite change and fatigue.  HENT: Negative for hearing loss, congestion, tinnitus and ear discharge.  dentist [id number] Eyes: Negative for visual disturbance (see optho q1y -- vision corrected to 20/20 with glasses).  Respiratory: Negative for cough, chest tightness and shortness of breath.   Cardiovascular: Negative for chest pain, palpitations and leg swelling.  Gastrointestinal: Negative for abdominal pain, diarrhea, constipation and abdominal distention.  Genitourinary: Negative for urgency, frequency, decreased urine volume and difficulty urinating.  Musculoskeletal: Negative for back pain, arthralgias and gait problem.  Skin: Negative for color change, pallor and rash.  Neurological: Negative for dizziness, light-headedness, numbness and headaches.  Hematological: Negative for adenopathy. Does not bruise/bleed easily.  Psychiatric/Behavioral: Negative for suicidal ideas, confusion, sleep disturbance, self-injury, dysphoric mood, decreased concentration and agitation.       Objective:    BP 107/66 mmHg  Pulse 83  Temp(Src) 98.6 F (37 C) (Oral)  Ht 5' 7.25" (1.708 m)  Wt 143 lb (64.864 kg)  BMI 22.23 kg/m2  SpO2 98% General appearance: alert, cooperative, appears stated age and no distress Head: Normocephalic, without obvious abnormality, atraumatic Eyes: negative findings: lids and lashes normal, conjunctivae and sclerae normal and pupils equal, round, reactive to light and accomodation Ears: normal TM's and external ear canals both ears Nose: Nares normal. Septum midline. Mucosa normal. No drainage or sinus tenderness. Throat: lips, mucosa, and tongue normal; teeth and gums normal Neck: no adenopathy, no carotid bruit,  no JVD, supple, symmetrical, trachea midline and thyroid not enlarged, symmetric, no tenderness/mass/nodules Back: symmetric, no curvature. ROM normal. No CVA tenderness. Lungs: clear to auscultation bilaterally Breasts: normal appearance, no masses or  tenderness Heart: regular rate and rhythm, S1, S2 normal, no murmur, click, rub or gallop Abdomen: soft, non-tender; bowel sounds normal; no masses,  no organomegaly Pelvic: deferred Extremities: extremities normal, atraumatic, no cyanosis or edema Pulses: 2+ and symmetric Skin: Skin color, texture, turgor normal. No rashes or lesions Lymph nodes: Cervical, supraclavicular, and axillary nodes normal. Neurologic: Alert and oriented X 3, normal strength and tone. Normal symmetric reflexes. Normal coordination and gait    Assessment:    Healthy female exam.      Plan:    ghm utd Check labs See After Visit Summary for Counseling Recommendations    1. Preventative health care See above - Comprehensive metabolic panel; Future - CBC with Differential/Platelet; Future - Lipid panel; Future - POCT urinalysis dipstick; Future - TSH; Future  2. Need for hepatitis C screening test   - Hepatitis C antibody; Future

## 2016-04-10 NOTE — Progress Notes (Signed)
Pre visit review using our clinic review tool, if applicable. No additional management support is needed unless otherwise documented below in the visit note. 

## 2016-04-10 NOTE — Patient Instructions (Signed)
Preventive Care for Adults, Female A healthy lifestyle and preventive care can promote health and wellness. Preventive health guidelines for women include the following key practices.  A routine yearly physical is a good way to check with your health care provider about your health and preventive screening. It is a chance to share any concerns and updates on your health and to receive a thorough exam.  Visit your dentist for a routine exam and preventive care every 6 months. Brush your teeth twice a day and floss once a day. Good oral hygiene prevents tooth decay and gum disease.  The frequency of eye exams is based on your age, health, family medical history, use of contact lenses, and other factors. Follow your health care provider's recommendations for frequency of eye exams.  Eat a healthy diet. Foods like vegetables, fruits, whole grains, low-fat dairy products, and lean protein foods contain the nutrients you need without too many calories. Decrease your intake of foods high in solid fats, added sugars, and salt. Eat the right amount of calories for you.Get information about a proper diet from your health care provider, if necessary.  Regular physical exercise is one of the most important things you can do for your health. Most adults should get at least 150 minutes of moderate-intensity exercise (any activity that increases your heart rate and causes you to sweat) each week. In addition, most adults need muscle-strengthening exercises on 2 or more days a week.  Maintain a healthy weight. The body mass index (BMI) is a screening tool to identify possible weight problems. It provides an estimate of body fat based on height and weight. Your health care provider can find your BMI and can help you achieve or maintain a healthy weight.For adults 20 years and older:  A BMI below 18.5 is considered underweight.  A BMI of 18.5 to 24.9 is normal.  A BMI of 25 to 29.9 is considered overweight.  A  BMI of 30 and above is considered obese.  Maintain normal blood lipids and cholesterol levels by exercising and minimizing your intake of saturated fat. Eat a balanced diet with plenty of fruit and vegetables. Blood tests for lipids and cholesterol should begin at age 45 and be repeated every 5 years. If your lipid or cholesterol levels are high, you are over 50, or you are at high risk for heart disease, you may need your cholesterol levels checked more frequently.Ongoing high lipid and cholesterol levels should be treated with medicines if diet and exercise are not working.  If you smoke, find out from your health care provider how to quit. If you do not use tobacco, do not start.  Lung cancer screening is recommended for adults aged 45-80 years who are at high risk for developing lung cancer because of a history of smoking. A yearly low-dose CT scan of the lungs is recommended for people who have at least a 30-pack-year history of smoking and are a current smoker or have quit within the past 15 years. A pack year of smoking is smoking an average of 1 pack of cigarettes a day for 1 year (for example: 1 pack a day for 30 years or 2 packs a day for 15 years). Yearly screening should continue until the smoker has stopped smoking for at least 15 years. Yearly screening should be stopped for people who develop a health problem that would prevent them from having lung cancer treatment.  If you are pregnant, do not drink alcohol. If you are  breastfeeding, be very cautious about drinking alcohol. If you are not pregnant and choose to drink alcohol, do not have more than 1 drink per day. One drink is considered to be 12 ounces (355 mL) of beer, 5 ounces (148 mL) of wine, or 1.5 ounces (44 mL) of liquor.  Avoid use of street drugs. Do not share needles with anyone. Ask for help if you need support or instructions about stopping the use of drugs.  High blood pressure causes heart disease and increases the risk  of stroke. Your blood pressure should be checked at least every 1 to 2 years. Ongoing high blood pressure should be treated with medicines if weight loss and exercise do not work.  If you are 55-79 years old, ask your health care provider if you should take aspirin to prevent strokes.  Diabetes screening is done by taking a blood sample to check your blood glucose level after you have not eaten for a certain period of time (fasting). If you are not overweight and you do not have risk factors for diabetes, you should be screened once every 3 years starting at age 45. If you are overweight or obese and you are 40-70 years of age, you should be screened for diabetes every year as part of your cardiovascular risk assessment.  Breast cancer screening is essential preventive care for women. You should practice "breast self-awareness." This means understanding the normal appearance and feel of your breasts and may include breast self-examination. Any changes detected, no matter how small, should be reported to a health care provider. Women in their 20s and 30s should have a clinical breast exam (CBE) by a health care provider as part of a regular health exam every 1 to 3 years. After age 40, women should have a CBE every year. Starting at age 40, women should consider having a mammogram (breast X-ray test) every year. Women who have a family history of breast cancer should talk to their health care provider about genetic screening. Women at a high risk of breast cancer should talk to their health care providers about having an MRI and a mammogram every year.  Breast cancer gene (BRCA)-related cancer risk assessment is recommended for women who have family members with BRCA-related cancers. BRCA-related cancers include breast, ovarian, tubal, and peritoneal cancers. Having family members with these cancers may be associated with an increased risk for harmful changes (mutations) in the breast cancer genes BRCA1 and  BRCA2. Results of the assessment will determine the need for genetic counseling and BRCA1 and BRCA2 testing.  Your health care provider may recommend that you be screened regularly for cancer of the pelvic organs (ovaries, uterus, and vagina). This screening involves a pelvic examination, including checking for microscopic changes to the surface of your cervix (Pap test). You may be encouraged to have this screening done every 3 years, beginning at age 21.  For women ages 30-65, health care providers may recommend pelvic exams and Pap testing every 3 years, or they may recommend the Pap and pelvic exam, combined with testing for human papilloma virus (HPV), every 5 years. Some types of HPV increase your risk of cervical cancer. Testing for HPV may also be done on women of any age with unclear Pap test results.  Other health care providers may not recommend any screening for nonpregnant women who are considered low risk for pelvic cancer and who do not have symptoms. Ask your health care provider if a screening pelvic exam is right for   you.  If you have had past treatment for cervical cancer or a condition that could lead to cancer, you need Pap tests and screening for cancer for at least 20 years after your treatment. If Pap tests have been discontinued, your risk factors (such as having a new sexual partner) need to be reassessed to determine if screening should resume. Some women have medical problems that increase the chance of getting cervical cancer. In these cases, your health care provider may recommend more frequent screening and Pap tests.  Colorectal cancer can be detected and often prevented. Most routine colorectal cancer screening begins at the age of 50 years and continues through age 75 years. However, your health care provider may recommend screening at an earlier age if you have risk factors for colon cancer. On a yearly basis, your health care provider may provide home test kits to check  for hidden blood in the stool. Use of a small camera at the end of a tube, to directly examine the colon (sigmoidoscopy or colonoscopy), can detect the earliest forms of colorectal cancer. Talk to your health care provider about this at age 50, when routine screening begins. Direct exam of the colon should be repeated every 5-10 years through age 75 years, unless early forms of precancerous polyps or small growths are found.  People who are at an increased risk for hepatitis B should be screened for this virus. You are considered at high risk for hepatitis B if:  You were born in a country where hepatitis B occurs often. Talk with your health care provider about which countries are considered high risk.  Your parents were born in a high-risk country and you have not received a shot to protect against hepatitis B (hepatitis B vaccine).  You have HIV or AIDS.  You use needles to inject street drugs.  You live with, or have sex with, someone who has hepatitis B.  You get hemodialysis treatment.  You take certain medicines for conditions like cancer, organ transplantation, and autoimmune conditions.  Hepatitis C blood testing is recommended for all people born from 1945 through 1965 and any individual with known risks for hepatitis C.  Practice safe sex. Use condoms and avoid high-risk sexual practices to reduce the spread of sexually transmitted infections (STIs). STIs include gonorrhea, chlamydia, syphilis, trichomonas, herpes, HPV, and human immunodeficiency virus (HIV). Herpes, HIV, and HPV are viral illnesses that have no cure. They can result in disability, cancer, and death.  You should be screened for sexually transmitted illnesses (STIs) including gonorrhea and chlamydia if:  You are sexually active and are younger than 24 years.  You are older than 24 years and your health care provider tells you that you are at risk for this type of infection.  Your sexual activity has changed  since you were last screened and you are at an increased risk for chlamydia or gonorrhea. Ask your health care provider if you are at risk.  If you are at risk of being infected with HIV, it is recommended that you take a prescription medicine daily to prevent HIV infection. This is called preexposure prophylaxis (PrEP). You are considered at risk if:  You are sexually active and do not regularly use condoms or know the HIV status of your partner(s).  You take drugs by injection.  You are sexually active with a partner who has HIV.  Talk with your health care provider about whether you are at high risk of being infected with HIV. If   you choose to begin PrEP, you should first be tested for HIV. You should then be tested every 3 months for as long as you are taking PrEP.  Osteoporosis is a disease in which the bones lose minerals and strength with aging. This can result in serious bone fractures or breaks. The risk of osteoporosis can be identified using a bone density scan. Women ages 67 years and over and women at risk for fractures or osteoporosis should discuss screening with their health care providers. Ask your health care provider whether you should take a calcium supplement or vitamin D to reduce the rate of osteoporosis.  Menopause can be associated with physical symptoms and risks. Hormone replacement therapy is available to decrease symptoms and risks. You should talk to your health care provider about whether hormone replacement therapy is right for you.  Use sunscreen. Apply sunscreen liberally and repeatedly throughout the day. You should seek shade when your shadow is shorter than you. Protect yourself by wearing long sleeves, pants, a wide-brimmed hat, and sunglasses year round, whenever you are outdoors.  Once a month, do a whole body skin exam, using a mirror to look at the skin on your back. Tell your health care provider of new moles, moles that have irregular borders, moles that  are larger than a pencil eraser, or moles that have changed in shape or color.  Stay current with required vaccines (immunizations).  Influenza vaccine. All adults should be immunized every year.  Tetanus, diphtheria, and acellular pertussis (Td, Tdap) vaccine. Pregnant women should receive 1 dose of Tdap vaccine during each pregnancy. The dose should be obtained regardless of the length of time since the last dose. Immunization is preferred during the 27th-36th week of gestation. An adult who has not previously received Tdap or who does not know her vaccine status should receive 1 dose of Tdap. This initial dose should be followed by tetanus and diphtheria toxoids (Td) booster doses every 10 years. Adults with an unknown or incomplete history of completing a 3-dose immunization series with Td-containing vaccines should begin or complete a primary immunization series including a Tdap dose. Adults should receive a Td booster every 10 years.  Varicella vaccine. An adult without evidence of immunity to varicella should receive 2 doses or a second dose if she has previously received 1 dose. Pregnant females who do not have evidence of immunity should receive the first dose after pregnancy. This first dose should be obtained before leaving the health care facility. The second dose should be obtained 4-8 weeks after the first dose.  Human papillomavirus (HPV) vaccine. Females aged 13-26 years who have not received the vaccine previously should obtain the 3-dose series. The vaccine is not recommended for use in pregnant females. However, pregnancy testing is not needed before receiving a dose. If a female is found to be pregnant after receiving a dose, no treatment is needed. In that case, the remaining doses should be delayed until after the pregnancy. Immunization is recommended for any person with an immunocompromised condition through the age of 61 years if she did not get any or all doses earlier. During the  3-dose series, the second dose should be obtained 4-8 weeks after the first dose. The third dose should be obtained 24 weeks after the first dose and 16 weeks after the second dose.  Zoster vaccine. One dose is recommended for adults aged 30 years or older unless certain conditions are present.  Measles, mumps, and rubella (MMR) vaccine. Adults born  before 1957 generally are considered immune to measles and mumps. Adults born in 1957 or later should have 1 or more doses of MMR vaccine unless there is a contraindication to the vaccine or there is laboratory evidence of immunity to each of the three diseases. A routine second dose of MMR vaccine should be obtained at least 28 days after the first dose for students attending postsecondary schools, health care workers, or international travelers. People who received inactivated measles vaccine or an unknown type of measles vaccine during 1963-1967 should receive 2 doses of MMR vaccine. People who received inactivated mumps vaccine or an unknown type of mumps vaccine before 1979 and are at high risk for mumps infection should consider immunization with 2 doses of MMR vaccine. For females of childbearing age, rubella immunity should be determined. If there is no evidence of immunity, females who are not pregnant should be vaccinated. If there is no evidence of immunity, females who are pregnant should delay immunization until after pregnancy. Unvaccinated health care workers born before 1957 who lack laboratory evidence of measles, mumps, or rubella immunity or laboratory confirmation of disease should consider measles and mumps immunization with 2 doses of MMR vaccine or rubella immunization with 1 dose of MMR vaccine.  Pneumococcal 13-valent conjugate (PCV13) vaccine. When indicated, a person who is uncertain of his immunization history and has no record of immunization should receive the PCV13 vaccine. All adults 65 years of age and older should receive this  vaccine. An adult aged 19 years or older who has certain medical conditions and has not been previously immunized should receive 1 dose of PCV13 vaccine. This PCV13 should be followed with a dose of pneumococcal polysaccharide (PPSV23) vaccine. Adults who are at high risk for pneumococcal disease should obtain the PPSV23 vaccine at least 8 weeks after the dose of PCV13 vaccine. Adults older than 59 years of age who have normal immune system function should obtain the PPSV23 vaccine dose at least 1 year after the dose of PCV13 vaccine.  Pneumococcal polysaccharide (PPSV23) vaccine. When PCV13 is also indicated, PCV13 should be obtained first. All adults aged 65 years and older should be immunized. An adult younger than age 65 years who has certain medical conditions should be immunized. Any person who resides in a nursing home or long-term care facility should be immunized. An adult smoker should be immunized. People with an immunocompromised condition and certain other conditions should receive both PCV13 and PPSV23 vaccines. People with human immunodeficiency virus (HIV) infection should be immunized as soon as possible after diagnosis. Immunization during chemotherapy or radiation therapy should be avoided. Routine use of PPSV23 vaccine is not recommended for American Indians, [place] Natives, or people younger than 65 years unless there are medical conditions that require PPSV23 vaccine. When indicated, people who have unknown immunization and have no record of immunization should receive PPSV23 vaccine. One-time revaccination 5 years after the first dose of PPSV23 is recommended for people aged 19-64 years who have chronic kidney failure, nephrotic syndrome, asplenia, or immunocompromised conditions. People who received 1-2 doses of PPSV23 before age 65 years should receive another dose of PPSV23 vaccine at age 65 years or later if at least 5 years have passed since the previous dose. Doses of PPSV23 are not  needed for people immunized with PPSV23 at or after age 65 years.  Meningococcal vaccine. Adults with asplenia or persistent complement component deficiencies should receive 2 doses of quadrivalent meningococcal conjugate (MenACWY-D) vaccine. The doses should be obtained   at least 2 months apart. Microbiologists working with certain meningococcal bacteria, [place] recruits, people at risk during an outbreak, and people who travel to or live in countries with a high rate of meningitis should be immunized. A first-year college student up through age 34 years who is living in a residence hall should receive a dose if she did not receive a dose on or after her 16th birthday. Adults who have certain high-risk conditions should receive one or more doses of vaccine.  Hepatitis A vaccine. Adults who wish to be protected from this disease, have certain high-risk conditions, work with hepatitis A-infected animals, work in hepatitis A research labs, or travel to or work in countries with a high rate of hepatitis A should be immunized. Adults who were previously unvaccinated and who anticipate close contact with an international adoptee during the first 60 days after arrival in the [place] from a country with a high rate of hepatitis A should be immunized.  Hepatitis B vaccine. Adults who wish to be protected from this disease, have certain high-risk conditions, may be exposed to blood or other infectious body fluids, are household contacts or sex partners of hepatitis B positive people, are clients or workers in certain care facilities, or travel to or work in countries with a high rate of hepatitis B should be immunized.  Haemophilus influenzae type b (Hib) vaccine. A previously unvaccinated person with asplenia or sickle cell disease or having a scheduled splenectomy should receive 1 dose of Hib vaccine. Regardless of previous immunization, a recipient of a hematopoietic stem cell transplant should receive a  3-dose series 6-12 months after her successful transplant. Hib vaccine is not recommended for adults with HIV infection. Preventive Services / Frequency Ages 35 to 4 years  Blood pressure check.** / Every 3-5 years.  Lipid and cholesterol check.** / Every 5 years beginning at age 60.  Clinical breast exam.** / Every 3 years for women in their 71s and 10s.  BRCA-related cancer risk assessment.** / For women who have family members with a BRCA-related cancer (breast, ovarian, tubal, or peritoneal cancers).  Pap test.** / Every 2 years from ages 76 through 26. Every 3 years starting at age 61 through age 76 or 93 with a history of 3 consecutive normal Pap tests.  HPV screening.** / Every 3 years from ages 37 through ages 60 to 51 with a history of 3 consecutive normal Pap tests.  Hepatitis C blood test.** / For any individual with known risks for hepatitis C.  Skin self-exam. / Monthly.  Influenza vaccine. / Every year.  Tetanus, diphtheria, and acellular pertussis (Tdap, Td) vaccine.** / Consult your health care provider. Pregnant women should receive 1 dose of Tdap vaccine during each pregnancy. 1 dose of Td every 10 years.  Varicella vaccine.** / Consult your health care provider. Pregnant females who do not have evidence of immunity should receive the first dose after pregnancy.  HPV vaccine. / 3 doses over 6 months, if 93 and younger. The vaccine is not recommended for use in pregnant females. However, pregnancy testing is not needed before receiving a dose.  Measles, mumps, rubella (MMR) vaccine.** / You need at least 1 dose of MMR if you were born in 1957 or later. You may also need a 2nd dose. For females of childbearing age, rubella immunity should be determined. If there is no evidence of immunity, females who are not pregnant should be vaccinated. If there is no evidence of immunity, females who are  pregnant should delay immunization until after pregnancy.  Pneumococcal  13-valent conjugate (PCV13) vaccine.** / Consult your health care provider.  Pneumococcal polysaccharide (PPSV23) vaccine.** / 1 to 2 doses if you smoke cigarettes or if you have certain conditions.  Meningococcal vaccine.** / 1 dose if you are age 68 to 8 years and a Market researcher living in a residence hall, or have one of several medical conditions, you need to get vaccinated against meningococcal disease. You may also need additional booster doses.  Hepatitis A vaccine.** / Consult your health care provider.  Hepatitis B vaccine.** / Consult your health care provider.  Haemophilus influenzae type b (Hib) vaccine.** / Consult your health care provider. Ages 7 to 53 years  Blood pressure check.** / Every year.  Lipid and cholesterol check.** / Every 5 years beginning at age 25 years.  Lung cancer screening. / Every year if you are aged 11-80 years and have a 30-pack-year history of smoking and currently smoke or have quit within the past 15 years. Yearly screening is stopped once you have quit smoking for at least 15 years or develop a health problem that would prevent you from having lung cancer treatment.  Clinical breast exam.** / Every year after age 48 years.  BRCA-related cancer risk assessment.** / For women who have family members with a BRCA-related cancer (breast, ovarian, tubal, or peritoneal cancers).  Mammogram.** / Every year beginning at age 41 years and continuing for as long as you are in good health. Consult with your health care provider.  Pap test.** / Every 3 years starting at age 65 years through age 37 or 70 years with a history of 3 consecutive normal Pap tests.  HPV screening.** / Every 3 years from ages 72 years through ages 60 to 40 years with a history of 3 consecutive normal Pap tests.  Fecal occult blood test (FOBT) of stool. / Every year beginning at age 21 years and continuing until age 5 years. You may not need to do this test if you get  a colonoscopy every 10 years.  Flexible sigmoidoscopy or colonoscopy.** / Every 5 years for a flexible sigmoidoscopy or every 10 years for a colonoscopy beginning at age 35 years and continuing until age 48 years.  Hepatitis C blood test.** / For all people born from [birth year] through 1965 and any individual with known risks for hepatitis C.  Skin self-exam. / Monthly.  Influenza vaccine. / Every year.  Tetanus, diphtheria, and acellular pertussis (Tdap/Td) vaccine.** / Consult your health care provider. Pregnant women should receive 1 dose of Tdap vaccine during each pregnancy. 1 dose of Td every 10 years.  Varicella vaccine.** / Consult your health care provider. Pregnant females who do not have evidence of immunity should receive the first dose after pregnancy.  Zoster vaccine.** / 1 dose for adults aged 30 years or older.  Measles, mumps, rubella (MMR) vaccine.** / You need at least 1 dose of MMR if you were born in 1957 or later. You may also need a second dose. For females of childbearing age, rubella immunity should be determined. If there is no evidence of immunity, females who are not pregnant should be vaccinated. If there is no evidence of immunity, females who are pregnant should delay immunization until after pregnancy.  Pneumococcal 13-valent conjugate (PCV13) vaccine.** / Consult your health care provider.  Pneumococcal polysaccharide (PPSV23) vaccine.** / 1 to 2 doses if you smoke cigarettes or if you have certain conditions.  Meningococcal vaccine.** /  Consult your health care provider.  Hepatitis A vaccine.** / Consult your health care provider.  Hepatitis B vaccine.** / Consult your health care provider.  Haemophilus influenzae type b (Hib) vaccine.** / Consult your health care provider. Ages 64 years and over  Blood pressure check.** / Every year.  Lipid and cholesterol check.** / Every 5 years beginning at age 23 years.  Lung cancer screening. / Every year if you  are aged 16-80 years and have a 30-pack-year history of smoking and currently smoke or have quit within the past 15 years. Yearly screening is stopped once you have quit smoking for at least 15 years or develop a health problem that would prevent you from having lung cancer treatment.  Clinical breast exam.** / Every year after age 74 years.  BRCA-related cancer risk assessment.** / For women who have family members with a BRCA-related cancer (breast, ovarian, tubal, or peritoneal cancers).  Mammogram.** / Every year beginning at age 44 years and continuing for as long as you are in good health. Consult with your health care provider.  Pap test.** / Every 3 years starting at age 58 years through age 22 or 39 years with 3 consecutive normal Pap tests. Testing can be stopped between 65 and 70 years with 3 consecutive normal Pap tests and no abnormal Pap or HPV tests in the past 10 years.  HPV screening.** / Every 3 years from ages 64 years through ages 70 or 61 years with a history of 3 consecutive normal Pap tests. Testing can be stopped between 65 and 70 years with 3 consecutive normal Pap tests and no abnormal Pap or HPV tests in the past 10 years.  Fecal occult blood test (FOBT) of stool. / Every year beginning at age 40 years and continuing until age 27 years. You may not need to do this test if you get a colonoscopy every 10 years.  Flexible sigmoidoscopy or colonoscopy.** / Every 5 years for a flexible sigmoidoscopy or every 10 years for a colonoscopy beginning at age 7 years and continuing until age 32 years.  Hepatitis C blood test.** / For all people born from [birth year] through 1965 and any individual with known risks for hepatitis C.  Osteoporosis screening.** / A one-time screening for women ages 30 years and over and women at risk for fractures or osteoporosis.  Skin self-exam. / Monthly.  Influenza vaccine. / Every year.  Tetanus, diphtheria, and acellular pertussis (Tdap/Td)  vaccine.** / 1 dose of Td every 10 years.  Varicella vaccine.** / Consult your health care provider.  Zoster vaccine.** / 1 dose for adults aged 35 years or older.  Pneumococcal 13-valent conjugate (PCV13) vaccine.** / Consult your health care provider.  Pneumococcal polysaccharide (PPSV23) vaccine.** / 1 dose for all adults aged 46 years and older.  Meningococcal vaccine.** / Consult your health care provider.  Hepatitis A vaccine.** / Consult your health care provider.  Hepatitis B vaccine.** / Consult your health care provider.  Haemophilus influenzae type b (Hib) vaccine.** / Consult your health care provider. ** Family history and personal history of risk and conditions may change your health care provider's recommendations.   This information is not intended to replace advice given to you by your health care provider. Make sure you discuss any questions you have with your health care provider.   Document Released: 11/28/2001 Document Revised: 10/23/2014 Document Reviewed: 02/27/2011 Elsevier Interactive Patient Education [employer].

## 2016-04-11 ENCOUNTER — Other Ambulatory Visit (INDEPENDENT_AMBULATORY_CARE_PROVIDER_SITE_OTHER): Payer: BLUE CROSS/BLUE SHIELD

## 2016-04-11 DIAGNOSIS — Z1159 Encounter for screening for other viral diseases: Secondary | ICD-10-CM

## 2016-04-11 DIAGNOSIS — Z0000 Encounter for general adult medical examination without abnormal findings: Secondary | ICD-10-CM

## 2016-04-11 LAB — COMPREHENSIVE METABOLIC PANEL
ALT: 17 U/L (ref 0–35)
AST: 21 U/L (ref 0–37)
Albumin: 4.3 g/dL (ref 3.5–5.2)
Alkaline Phosphatase: 80 U/L (ref 39–117)
BUN: 16 mg/dL (ref 6–23)
CHLORIDE: 107 meq/L (ref 96–112)
CO2: 30 meq/L (ref 19–32)
Calcium: 9.4 mg/dL (ref 8.4–10.5)
Creatinine, Ser: 0.8 mg/dL (ref 0.40–1.20)
GFR: 78.01 mL/min (ref >60.00)
GLUCOSE: 86 mg/dL (ref 70–99)
POTASSIUM: 4.2 meq/L (ref 3.5–5.1)
Sodium: 142 mEq/L (ref 135–145)
Total Bilirubin: 0.6 mg/dL (ref 0.2–1.2)
Total Protein: 6.9 g/dL (ref 6.0–8.3)

## 2016-04-11 LAB — CBC WITH DIFFERENTIAL/PLATELET
BASOS ABS: 0 10*3/uL (ref 0.0–0.1)
Basophils Relative: 0.4 % (ref 0.0–3.0)
EOS PCT: 1.8 % (ref 0.0–5.0)
Eosinophils Absolute: 0.1 10*3/uL (ref 0.0–0.7)
HCT: 40.1 % (ref 36.0–46.0)
HEMOGLOBIN: 13.6 g/dL (ref 12.0–15.0)
LYMPHS ABS: 1.3 10*3/uL (ref 0.7–4.0)
LYMPHS PCT: 32.2 % (ref 12.0–46.0)
MCHC: 33.8 g/dL (ref 30.0–36.0)
MCV: 93.8 fl (ref 78.0–100.0)
MONOS PCT: 8.9 % (ref 3.0–12.0)
Monocytes Absolute: 0.4 10*3/uL (ref 0.1–1.0)
NEUTROS PCT: 56.7 % (ref 43.0–77.0)
Neutro Abs: 2.3 10*3/uL (ref 1.4–7.7)
Platelets: 245 10*3/uL (ref 150.0–400.0)
RBC: 4.27 Mil/uL (ref 3.87–5.11)
RDW: 12.6 % (ref 11.5–15.5)
WBC: 4.1 10*3/uL (ref 4.0–10.5)

## 2016-04-11 LAB — POCT URINALYSIS DIPSTICK
BILIRUBIN UA: NEGATIVE
GLUCOSE UA: NEGATIVE
KETONES UA: NEGATIVE
LEUKOCYTES UA: NEGATIVE
NITRITE UA: NEGATIVE
Protein, UA: NEGATIVE
RBC UA: NEGATIVE
Spec Grav, UA: 1.025
Urobilinogen, UA: 0.2
pH, UA: 6

## 2016-04-11 LAB — TSH: TSH: 2.1 u[IU]/mL (ref 0.35–4.50)

## 2016-04-11 LAB — LIPID PANEL
Cholesterol: 234 mg/dL — ABNORMAL HIGH (ref 0–200)
HDL: 100.9 mg/dL (ref >39.00)
LDL CALC: 127 mg/dL — AB (ref 0–99)
NONHDL: 133.55
Total CHOL/HDL Ratio: 2
Triglycerides: 32 mg/dL (ref 0.0–149.0)
VLDL: 6.4 mg/dL (ref 0.0–40.0)

## 2016-04-11 NOTE — Addendum Note (Signed)
Addended by: Harley AltoPRICE, KRISTY M on: 04/11/2016 10:42 AM   Modules accepted: Orders

## 2016-04-12 LAB — HEPATITIS C ANTIBODY: HCV Ab: NEGATIVE

## 2016-05-05 ENCOUNTER — Encounter: Payer: Self-pay | Admitting: Podiatry

## 2016-05-05 ENCOUNTER — Ambulatory Visit (INDEPENDENT_AMBULATORY_CARE_PROVIDER_SITE_OTHER): Payer: BLUE CROSS/BLUE SHIELD

## 2016-05-05 ENCOUNTER — Ambulatory Visit (INDEPENDENT_AMBULATORY_CARE_PROVIDER_SITE_OTHER): Payer: BLUE CROSS/BLUE SHIELD | Admitting: Podiatry

## 2016-05-05 VITALS — BP 114/78 | HR 92 | Resp 18

## 2016-05-05 DIAGNOSIS — M795 Residual foreign body in soft tissue: Secondary | ICD-10-CM

## 2016-05-05 DIAGNOSIS — M2042 Other hammer toe(s) (acquired), left foot: Secondary | ICD-10-CM

## 2016-05-05 DIAGNOSIS — M2041 Other hammer toe(s) (acquired), right foot: Secondary | ICD-10-CM

## 2016-05-05 DIAGNOSIS — F419 Anxiety disorder, unspecified: Secondary | ICD-10-CM | POA: Diagnosis not present

## 2016-05-05 NOTE — Progress Notes (Signed)
   Subjective:    Patient ID: Jennifer Stokes, female   [name] DOB: 1956-07-26, 60 y.o.   MRN: [id number]  HPI  60 year old female presents the office today for concerns of possible foreign body to her left foot which is been ongoing for about 1 month. She states that it hurts with pressure. Denies any redness or drainage or any swelling. She was unable to remove any foreign body. She does not recall stepping on anything but she feels that they're something inside of her foot. While she was here she also discuss treatment options for hammertoes. The patient do not cause any pain at this time however when she does wear some shoes that due to cost and discomfort. No other complaints at this time.  Review of Systems  All other systems reviewed and are negative.      Objective:   Physical Exam General: AAO x3, NAD  Dermatological: Left foot submetatarsal with a hyperkeratotic lesion. Upon debridement I was able to remove a very small piece of glass. After debridement and removal with small piece of glass there was no further tenderness to palpation to the area. Also able to visualize any other foreign body. There is no drainage or pus. There is no swelling edema, erythema, ascending cellulitis, fluctuance, crepitus or malodor. There are no other lesions present bilaterally.  Vascular: Dorsalis Pedis artery and Posterior Tibial artery pedal pulses are 2/4 bilateral with immedate capillary fill time. Pedal hair growth present. No varicosities and no lower extremity edema present bilateral. There is no pain with calf compression, swelling, warmth, erythema.   Neruologic: Grossly intact via light touch bilateral. Vibratory intact via tuning fork bilateral. Protective threshold with Semmes Wienstein monofilament intact to all pedal sites bilateral.   Musculoskeletal: Tenderness upon palpation of the hyperkeratotic lesion left submetatarsal. There is resolved tenderness after removal of the foreign body.  Hammertoe  contractures present. No pain, crepitus, or limitation noted with foot and ankle range of motion bilateral. Muscular strength 5/5 in all groups tested bilateral.  Gait: Unassisted, Nonantalgic.      Assessment & Plan:  Foot foreign body, hyperkeratoti , hammertoes  -Treatment options discussed including all alternatives, risks, and complications -Etiology of symptoms were discussed -Discussed treatment options for hammertoes. She wishes to pursue conservative treatment. Offloading pads were discussed and dispensed as well as shoe gear modifications discussed. -X-rays were obtained and reviewed with the patient. X-ray was obtained. Unable to identify any foreign body. No evidence of acute fracture. -Hyperkeratotic lesion was debrided and a small shard of glass was removed. No further foreign bodies identified. Recommended Neosporin dressing changes daily. Monitor for signs or symptoms of infection. Offloading pads were dispensed. Follow-up if any symptoms recur or if they worsen. Call any questions concerns meantime.  Ovid CurdMatthew Wagoner, DPM

## 2016-05-14 DIAGNOSIS — M795 Residual foreign body in soft tissue: Secondary | ICD-10-CM | POA: Insufficient documentation

## 2016-06-02 DIAGNOSIS — F419 Anxiety disorder, unspecified: Secondary | ICD-10-CM | POA: Diagnosis not present

## 2016-06-03 DIAGNOSIS — F411 Generalized anxiety disorder: Secondary | ICD-10-CM | POA: Diagnosis not present

## 2016-06-03 DIAGNOSIS — F900 Attention-deficit hyperactivity disorder, predominantly inattentive type: Secondary | ICD-10-CM | POA: Diagnosis not present

## 2016-06-07 DIAGNOSIS — M542 Cervicalgia: Secondary | ICD-10-CM | POA: Diagnosis not present

## 2016-06-07 DIAGNOSIS — M7582 Other shoulder lesions, left shoulder: Secondary | ICD-10-CM | POA: Diagnosis not present

## 2016-06-08 DIAGNOSIS — Z1211 Encounter for screening for malignant neoplasm of colon: Secondary | ICD-10-CM | POA: Diagnosis not present

## 2016-06-13 DIAGNOSIS — M25512 Pain in left shoulder: Secondary | ICD-10-CM | POA: Diagnosis not present

## 2016-06-15 DIAGNOSIS — H43811 Vitreous degeneration, right eye: Secondary | ICD-10-CM | POA: Diagnosis not present

## 2016-06-15 DIAGNOSIS — H04123 Dry eye syndrome of bilateral lacrimal glands: Secondary | ICD-10-CM | POA: Diagnosis not present

## 2016-06-15 DIAGNOSIS — M5412 Radiculopathy, cervical region: Secondary | ICD-10-CM | POA: Diagnosis not present

## 2016-06-15 DIAGNOSIS — H524 Presbyopia: Secondary | ICD-10-CM | POA: Diagnosis not present

## 2016-06-21 DIAGNOSIS — F419 Anxiety disorder, unspecified: Secondary | ICD-10-CM | POA: Diagnosis not present

## 2016-06-23 DIAGNOSIS — M542 Cervicalgia: Secondary | ICD-10-CM | POA: Diagnosis not present

## 2016-06-25 DIAGNOSIS — Z23 Encounter for immunization: Secondary | ICD-10-CM | POA: Diagnosis not present

## 2016-06-27 ENCOUNTER — Other Ambulatory Visit: Payer: Self-pay | Admitting: Family Medicine

## 2016-06-27 DIAGNOSIS — M5000 Cervical disc disorder with myelopathy, unspecified cervical region: Secondary | ICD-10-CM | POA: Diagnosis not present

## 2016-06-27 DIAGNOSIS — Z1231 Encounter for screening mammogram for malignant neoplasm of breast: Secondary | ICD-10-CM

## 2016-06-28 DIAGNOSIS — M5412 Radiculopathy, cervical region: Secondary | ICD-10-CM | POA: Diagnosis not present

## 2016-06-28 DIAGNOSIS — M542 Cervicalgia: Secondary | ICD-10-CM | POA: Diagnosis not present

## 2016-06-28 DIAGNOSIS — M5000 Cervical disc disorder with myelopathy, unspecified cervical region: Secondary | ICD-10-CM | POA: Diagnosis not present

## 2016-06-29 DIAGNOSIS — M5412 Radiculopathy, cervical region: Secondary | ICD-10-CM | POA: Diagnosis not present

## 2016-06-29 DIAGNOSIS — M542 Cervicalgia: Secondary | ICD-10-CM | POA: Diagnosis not present

## 2016-06-29 DIAGNOSIS — M5000 Cervical disc disorder with myelopathy, unspecified cervical region: Secondary | ICD-10-CM | POA: Diagnosis not present

## 2016-06-30 DIAGNOSIS — K6389 Other specified diseases of intestine: Secondary | ICD-10-CM | POA: Diagnosis not present

## 2016-06-30 DIAGNOSIS — D125 Benign neoplasm of sigmoid colon: Secondary | ICD-10-CM | POA: Diagnosis not present

## 2016-06-30 DIAGNOSIS — Z1211 Encounter for screening for malignant neoplasm of colon: Secondary | ICD-10-CM | POA: Diagnosis not present

## 2016-06-30 DIAGNOSIS — K635 Polyp of colon: Secondary | ICD-10-CM | POA: Diagnosis not present

## 2016-06-30 DIAGNOSIS — D120 Benign neoplasm of cecum: Secondary | ICD-10-CM | POA: Diagnosis not present

## 2016-06-30 DIAGNOSIS — D122 Benign neoplasm of ascending colon: Secondary | ICD-10-CM | POA: Diagnosis not present

## 2016-07-06 ENCOUNTER — Ambulatory Visit: Payer: BLUE CROSS/BLUE SHIELD

## 2016-07-07 ENCOUNTER — Ambulatory Visit
Admission: RE | Admit: 2016-07-07 | Discharge: 2016-07-07 | Disposition: A | Discharge status: Home / Self Care | Payer: BLUE CROSS/BLUE SHIELD | Source: Ambulatory Visit | Attending: Family Medicine | Admitting: Family Medicine

## 2016-07-07 DIAGNOSIS — Z1231 Encounter for screening mammogram for malignant neoplasm of breast: Secondary | ICD-10-CM | POA: Diagnosis not present

## 2016-07-17 DIAGNOSIS — M542 Cervicalgia: Secondary | ICD-10-CM | POA: Diagnosis not present

## 2016-07-17 DIAGNOSIS — M5412 Radiculopathy, cervical region: Secondary | ICD-10-CM | POA: Diagnosis not present

## 2016-07-18 DIAGNOSIS — F419 Anxiety disorder, unspecified: Secondary | ICD-10-CM | POA: Diagnosis not present

## 2016-07-21 ENCOUNTER — Ambulatory Visit (INDEPENDENT_AMBULATORY_CARE_PROVIDER_SITE_OTHER): Payer: BLUE CROSS/BLUE SHIELD | Admitting: Podiatry

## 2016-07-21 ENCOUNTER — Encounter: Payer: Self-pay | Admitting: Podiatry

## 2016-07-21 DIAGNOSIS — Q828 Other specified congenital malformations of skin: Secondary | ICD-10-CM

## 2016-07-21 NOTE — Progress Notes (Signed)
Subjective: 60 year old female presents the office today for follow-up evaluation and continued pain of her left foot. The callus. She states that the area did resolve after last appointment but has slowly recurred. She denies any drainage or redness or any swelling or any signs of infection. Denies any systemic complaints such as fevers, chills, nausea, vomiting. No acute changes since last appointment, and no other complaints at this time.   Objective: AAO x3, NAD DP/PT pulses palpable bilaterally, CRT less than 3 seconds Left foot some metatarsal 1 hyperkeratotic lesion. Upon appointment as of punctate annular lesion. There is no foreign body identified there is no drainage or pus. There is no swelling erythema, ascending cellulitis. This no fluctuance or crepitus or any malodor. Person is palpation to the area however upon debridement there is resolution of pain. No edema, erythema, increase in warmth to bilateral lower extremities.  No open lesions or pre-ulcerative lesions.  No pain with calf compression, swelling, warmth, erythema  Assessment: Left foot submetatarsal one porokeratosis  Plan: -All treatment options discussed with the patient including all alternatives, risks, complications.  -Area was debrided to without complications or bleeding. There was cleaned and a pad was placed followed by salicylic acid and a bandage. Post procedure chest discussed. I was unable to identify any further foreign body today. Discussed ultrasound but she wishes to hold off. Also back in 3 weeks if symptoms continue or sooner if any issues are to arise. -Patient encouraged to call the office with any questions, concerns, change in symptoms.    Ovid CurdMatthew Jashua Knaak, DPM

## 2016-08-08 DIAGNOSIS — F419 Anxiety disorder, unspecified: Secondary | ICD-10-CM | POA: Diagnosis not present

## 2016-08-14 ENCOUNTER — Ambulatory Visit (INDEPENDENT_AMBULATORY_CARE_PROVIDER_SITE_OTHER): Payer: BLUE CROSS/BLUE SHIELD | Admitting: Podiatry

## 2016-08-14 ENCOUNTER — Encounter: Payer: Self-pay | Admitting: Podiatry

## 2016-08-14 DIAGNOSIS — Q828 Other specified congenital malformations of skin: Secondary | ICD-10-CM

## 2016-08-14 NOTE — Progress Notes (Signed)
Subjective: 60 year old female presents the office today for follow-up of porokeratosis of the left foot. If that she is doing much better and she is having significant improved pain to the area. She had no complications with the last treatment but she did pull off the loose skin that formed. Denies any drainage or redness or swelling. Denies any systemic complaints such as fevers, chills, nausea, vomiting. No acute changes since last appointment, and no other complaints at this time.   Objective: AAO x3, NAD DP/PT pulses palpable bilaterally, CRT less than 3 seconds Left foot some metatarsal 1 hyperkeratotic lesion. Upon appointment as of punctate annular lesion which appears to be greatly improved compared to last appointment but does continue somewhat. There is no foreign body identified there is no drainage or pus. There is no swelling erythema, ascending cellulitis. This no fluctuance or crepitus or any malodor. Person is palpation to the area however upon debridement there is resolution of pain. No edema, erythema, increase in warmth to bilateral lower extremities.  No open lesions or pre-ulcerative lesions.  No pain with calf compression, swelling, warmth, erythema  Assessment: Left foot submetatarsal one porokeratosis, improved  Plan: -All treatment options discussed with the patient including all alternatives, risks, complications.  -Area was debrided to without complications or bleeding. There was cleaned and a pad was placed followed by salicylic acid and a bandage. Post procedure chest discussed. I was unable to identify any further foreign body today. Discussed ultrasound but she wishes to hold off. Follow-up in 3 weeks if symptoms continue or sooner if any issues are to arise. -Patient encouraged to call the office with any questions, concerns, change in symptoms.    Ovid CurdMatthew Layah Skousen, DPM

## 2016-09-05 DIAGNOSIS — F419 Anxiety disorder, unspecified: Secondary | ICD-10-CM | POA: Diagnosis not present

## 2016-10-19 ENCOUNTER — Ambulatory Visit (INDEPENDENT_AMBULATORY_CARE_PROVIDER_SITE_OTHER): Payer: BLUE CROSS/BLUE SHIELD | Admitting: Podiatry

## 2016-10-19 DIAGNOSIS — M79672 Pain in left foot: Secondary | ICD-10-CM

## 2016-10-19 DIAGNOSIS — Q828 Other specified congenital malformations of skin: Secondary | ICD-10-CM

## 2016-10-20 NOTE — Progress Notes (Signed)
Subjective: 61 year old female presents the office today for concerns of a small callus of the bottom of the left foot. After last appointment the area did resolve however over the last several weeks to started to come back. She denies any falling object. Denies he swelling or redness. The areas painful pressure in shoe gear. Denies any systemic complaints such as fevers, chills, nausea, vomiting. No acute changes since last appointment, and no other complaints at this time.   Objective: AAO x3, NAD DP/PT pulses palpable bilaterally, CRT less than 3 seconds Left foot submetatarsal one small punctate annular hyperkeratotic lesion. Upon debridement there is no evidence of foreign body. There is no pinpoint bleeding or evidence of verruca. There is tenderness at the LESION. No other lesions identified. No open lesions or pre-ulcerative lesions.  No pain with calf compression, swelling, warmth, erythema  Assessment: Left foot porokeratosis  Plan: -All treatment options discussed with the patient including all alternatives, risks, complications.  -Lesion sharply debrided to without complications or bleeding. Area was cleaned. However as placed followed by salinocaine. Post procedure instructions were discussed. -Follow-up as needed. Call any questions or concerns.  Ovid CurdMatthew Matelyn Antonelli, DPM

## 2016-10-31 DIAGNOSIS — F419 Anxiety disorder, unspecified: Secondary | ICD-10-CM | POA: Diagnosis not present

## 2016-11-28 DIAGNOSIS — F419 Anxiety disorder, unspecified: Secondary | ICD-10-CM | POA: Diagnosis not present

## 2016-12-18 DIAGNOSIS — F419 Anxiety disorder, unspecified: Secondary | ICD-10-CM | POA: Diagnosis not present

## 2016-12-25 DIAGNOSIS — F900 Attention-deficit hyperactivity disorder, predominantly inattentive type: Secondary | ICD-10-CM | POA: Diagnosis not present

## 2017-01-16 DIAGNOSIS — F419 Anxiety disorder, unspecified: Secondary | ICD-10-CM | POA: Diagnosis not present

## 2017-01-23 DIAGNOSIS — L814 Other melanin hyperpigmentation: Secondary | ICD-10-CM | POA: Diagnosis not present

## 2017-01-23 DIAGNOSIS — L821 Other seborrheic keratosis: Secondary | ICD-10-CM | POA: Diagnosis not present

## 2017-01-23 DIAGNOSIS — D1801 Hemangioma of skin and subcutaneous tissue: Secondary | ICD-10-CM | POA: Diagnosis not present

## 2017-02-15 ENCOUNTER — Encounter: Payer: Self-pay | Admitting: Family Medicine

## 2017-02-15 ENCOUNTER — Ambulatory Visit (INDEPENDENT_AMBULATORY_CARE_PROVIDER_SITE_OTHER): Payer: BLUE CROSS/BLUE SHIELD | Admitting: Family Medicine

## 2017-02-15 VITALS — BP 124/76 | HR 87 | Temp 98.3°F | Resp 16 | Ht 67.25 in | Wt 142.4 lb

## 2017-02-15 DIAGNOSIS — J324 Chronic pansinusitis: Secondary | ICD-10-CM

## 2017-02-15 MED ORDER — AMOXICILLIN-POT CLAVULANATE 875-125 MG PO TABS: 1.0000 | ORAL_TABLET | Freq: Two times a day (BID) | ORAL | 0 refills | Status: DC

## 2017-02-15 MED ORDER — CETIRIZINE HCL 10 MG PO TABS: 10.0000 mg | ORAL_TABLET | Freq: Every day | ORAL | 11 refills | Status: DC

## 2017-02-15 MED ORDER — FLUTICASONE PROPIONATE 50 MCG/ACT NA SUSP: 2.0000 | Freq: Every day | NASAL | 6 refills | Status: DC

## 2017-02-15 NOTE — Progress Notes (Signed)
Pre visit review using our clinic review tool, if applicable. No additional management support is needed unless otherwise documented below in the visit note. 

## 2017-02-15 NOTE — Patient Instructions (Signed)

## 2017-02-15 NOTE — Progress Notes (Signed)
Patient ID: Jennifer Stokes, female   DOB: 26-Mar-1956, 61 y.o.   MRN: [id number]    Subjective:    Patient ID: Jennifer Stokes, female    DOB: 26-Mar-1956, 61 y.o.   MRN: [id number]  Chief Complaint  Patient presents with  . Sinus Problem    Sinus Problem  Episode onset: 4 weeks ago. Maximum temperature: felt like she had fever. Associated symptoms include congestion, coughing, ear pain, headaches and a sore throat. Pertinent negatives include no chills, diaphoresis, sinus pressure or sneezing. Past treatments include oral decongestants (claritin).   Patient is in today for possible sinus infection.  Past Medical History:  Diagnosis Date  . ADD (attention deficit disorder)     Past Surgical History:  Procedure Laterality Date  . COSMETIC SURGERY      Family History  Problem Relation Age of Onset  . Diabetes Neg Hx   . Heart disease Neg Hx   . Hyperlipidemia Neg Hx   . Hypertension Neg Hx   . Stroke Neg Hx   . Bipolar disorder Son     Social History   Social History  . Marital status: Married    Spouse name: N/A  . Number of children: N/A  . Years of education: N/A   Occupational History  . financial planner    Social History Main Topics  . Smoking status: Never Smoker  . Smokeless tobacco: Never Used  . Alcohol use No  . Drug use: No  . Sexual activity: Yes    Partners: Male   Other Topics Concern  . Not on file   Social History Narrative  . No narrative on file    Outpatient Medications Prior to Visit  Medication Sig Dispense Refill  . methylphenidate (RITALIN) 20 MG tablet Take 20 mg by mouth 4 (four) times daily.    . sertraline (ZOLOFT) 25 MG tablet TAKE 1 BY MOUTH DAILY 90 tablet 3   No facility-administered medications prior to visit.     No Known Allergies  Review of Systems  Constitutional: Negative for chills and diaphoresis.  HENT: Positive for congestion, ear pain and sore throat. Negative for sinus pressure and sneezing.   Respiratory:  Positive for cough.   Neurological: Positive for headaches.       Objective:    Physical Exam  Constitutional: She is oriented to person, place, and time. She appears well-developed and well-nourished. No distress.  HENT:  Head: Normocephalic and atraumatic.  Right Ear: External ear normal.  Left Ear: External ear normal.  + PND + errythema  Eyes: Conjunctivae are normal. Right eye exhibits no discharge. Left eye exhibits no discharge.  Neck: Normal range of motion. No thyromegaly present.  Cardiovascular: Normal rate, regular rhythm and normal heart sounds.   No murmur heard. Pulmonary/Chest: Effort normal and breath sounds normal. No respiratory distress. She has no wheezes. She has no rales. She exhibits no tenderness.  Abdominal: Soft. Bowel sounds are normal. There is no tenderness.  Musculoskeletal: Normal range of motion. She exhibits no edema or deformity.  Lymphadenopathy:    She has cervical adenopathy.  Neurological: She is alert and oriented to person, place, and time.  Skin: Skin is warm and dry. She is not diaphoretic.  Psychiatric: She has a normal mood and affect. Her behavior is normal. Judgment and thought content normal.  Nursing note and vitals reviewed.   BP 124/76 (BP Location: Left Arm, Cuff Size: Normal)   Pulse 87   Temp 98.3 F (36.8  C) (Oral)   Resp 16   Ht 5' 7.25" (1.708 m)   Wt 142 lb 6.4 oz (64.6 kg)   SpO2 95%   BMI 22.14 kg/m  Wt Readings from Last 3 Encounters:  02/15/17 142 lb 6.4 oz (64.6 kg)  04/10/16 143 lb (64.9 kg)  04/08/15 134 lb 8 oz (61 kg)     Lab Results  Component Value Date   WBC 4.1 04/11/2016   HGB 13.6 04/11/2016   HCT 40.1 04/11/2016   PLT 245.0 04/11/2016   GLUCOSE 86 04/11/2016   CHOL 234 (H) 04/11/2016   TRIG 32.0 04/11/2016   HDL 100.90 04/11/2016   LDLDIRECT 88.5 10/31/2012   LDLCALC 127 (H) 04/11/2016   ALT 17 04/11/2016   AST 21 04/11/2016   NA 142 04/11/2016   K 4.2 04/11/2016   CL 107  04/11/2016   CREATININE 0.80 04/11/2016   BUN 16 04/11/2016   CO2 30 04/11/2016   TSH 2.10 04/11/2016   HGBA1C 5.4 10/03/2011   MICROALBUR 0.3 10/31/2012    Lab Results  Component Value Date   TSH 2.10 04/11/2016   Lab Results  Component Value Date   WBC 4.1 04/11/2016   HGB 13.6 04/11/2016   HCT 40.1 04/11/2016   MCV 93.8 04/11/2016   PLT 245.0 04/11/2016   Lab Results  Component Value Date   NA 142 04/11/2016   K 4.2 04/11/2016   CO2 30 04/11/2016   GLUCOSE 86 04/11/2016   BUN 16 04/11/2016   CREATININE 0.80 04/11/2016   BILITOT 0.6 04/11/2016   ALKPHOS 80 04/11/2016   AST 21 04/11/2016   ALT 17 04/11/2016   PROT 6.9 04/11/2016   ALBUMIN 4.3 04/11/2016   CALCIUM 9.4 04/11/2016   GFR 78.01 04/11/2016   Lab Results  Component Value Date   CHOL 234 (H) 04/11/2016   Lab Results  Component Value Date   HDL 100.90 04/11/2016   Lab Results  Component Value Date   LDLCALC 127 (H) 04/11/2016   Lab Results  Component Value Date   TRIG 32.0 04/11/2016   Lab Results  Component Value Date   CHOLHDL 2 04/11/2016   Lab Results  Component Value Date   HGBA1C 5.4 10/03/2011       Assessment & Plan:   Problem List Items Addressed This Visit    None    Visit Diagnoses    Pansinusitis, unspecified chronicity    -  Primary   Relevant Medications   amoxicillin-clavulanate (AUGMENTIN) 875-125 MG tablet   fluticasone (FLONASE) 50 MCG/ACT nasal spray   cetirizine (ZYRTEC) 10 MG tablet      I am having [name] start on amoxicillin-clavulanate, fluticasone, and cetirizine. I am also having her maintain her methylphenidate and sertraline.  Meds ordered this encounter  Medications  . amoxicillin-clavulanate (AUGMENTIN) 875-125 MG tablet    Sig: Take 1 tablet by mouth 2 (two) times daily.    Dispense:  20 tablet    Refill:  0  . fluticasone (FLONASE) 50 MCG/ACT nasal spray    Sig: Place 2 sprays into both nostrils daily.    Dispense:  16 g    Refill:  6    . cetirizine (ZYRTEC) 10 MG tablet    Sig: Take 1 tablet (10 mg total) by mouth daily.    Dispense:  30 tablet    Refill:  [address] [place], [place]

## 2017-02-20 DIAGNOSIS — F419 Anxiety disorder, unspecified: Secondary | ICD-10-CM | POA: Diagnosis not present

## 2017-03-19 DIAGNOSIS — F419 Anxiety disorder, unspecified: Secondary | ICD-10-CM | POA: Diagnosis not present

## 2017-03-20 DIAGNOSIS — F419 Anxiety disorder, unspecified: Secondary | ICD-10-CM | POA: Diagnosis not present

## 2017-04-11 DIAGNOSIS — F419 Anxiety disorder, unspecified: Secondary | ICD-10-CM | POA: Diagnosis not present

## 2017-04-12 ENCOUNTER — Ambulatory Visit (INDEPENDENT_AMBULATORY_CARE_PROVIDER_SITE_OTHER): Payer: BLUE CROSS/BLUE SHIELD | Admitting: Family Medicine

## 2017-04-12 VITALS — BP 101/60 | HR 81 | Temp 98.8°F | Ht 67.0 in | Wt 141.0 lb

## 2017-04-12 DIAGNOSIS — G8929 Other chronic pain: Secondary | ICD-10-CM

## 2017-04-12 DIAGNOSIS — M25562 Pain in left knee: Secondary | ICD-10-CM | POA: Diagnosis not present

## 2017-04-12 DIAGNOSIS — Z0000 Encounter for general adult medical examination without abnormal findings: Secondary | ICD-10-CM

## 2017-04-12 LAB — POC URINALSYSI DIPSTICK (AUTOMATED)
BILIRUBIN UA: NEGATIVE
Blood, UA: NEGATIVE
Glucose, UA: NEGATIVE
KETONES UA: NEGATIVE
Leukocytes, UA: NEGATIVE
Nitrite, UA: NEGATIVE
PH UA: 6 (ref 5.0–8.0)
PROTEIN UA: NEGATIVE
SPEC GRAV UA: 1.02 (ref 1.010–1.025)
Urobilinogen, UA: 0.2 E.U./dL

## 2017-04-12 LAB — COMPREHENSIVE METABOLIC PANEL
ALBUMIN: 4.5 g/dL (ref 3.5–5.2)
ALT: 20 U/L (ref 0–35)
AST: 23 U/L (ref 0–37)
Alkaline Phosphatase: 95 U/L (ref 39–117)
BUN: 19 mg/dL (ref 6–23)
CALCIUM: 9.6 mg/dL (ref 8.4–10.5)
CHLORIDE: 103 meq/L (ref 96–112)
CO2: 29 mEq/L (ref 19–32)
Creatinine, Ser: 0.79 mg/dL (ref 0.40–1.20)
GFR: 78.88 mL/min (ref >60.00)
Glucose, Bld: 93 mg/dL (ref 70–99)
POTASSIUM: 4.2 meq/L (ref 3.5–5.1)
Sodium: 139 mEq/L (ref 135–145)
TOTAL PROTEIN: 6.8 g/dL (ref 6.0–8.3)
Total Bilirubin: 0.7 mg/dL (ref 0.2–1.2)

## 2017-04-12 LAB — CBC WITH DIFFERENTIAL/PLATELET
BASOS ABS: 0 10*3/uL (ref 0.0–0.1)
Basophils Relative: 0.4 % (ref 0.0–3.0)
EOS ABS: 0.1 10*3/uL (ref 0.0–0.7)
EOS PCT: 1.3 % (ref 0.0–5.0)
HCT: 40.9 % (ref 36.0–46.0)
HEMOGLOBIN: 13.8 g/dL (ref 12.0–15.0)
Lymphocytes Relative: 31.3 % (ref 12.0–46.0)
Lymphs Abs: 1.4 10*3/uL (ref 0.7–4.0)
MCHC: 33.7 g/dL (ref 30.0–36.0)
MCV: 95.1 fl (ref 78.0–100.0)
MONO ABS: 0.4 10*3/uL (ref 0.1–1.0)
Monocytes Relative: 8.4 % (ref 3.0–12.0)
NEUTROS PCT: 58.6 % (ref 43.0–77.0)
Neutro Abs: 2.6 10*3/uL (ref 1.4–7.7)
Platelets: 253 10*3/uL (ref 150.0–400.0)
RBC: 4.3 Mil/uL (ref 3.87–5.11)
RDW: 12.9 % (ref 11.5–15.5)
WBC: 4.5 10*3/uL (ref 4.0–10.5)

## 2017-04-12 LAB — LIPID PANEL
CHOLESTEROL: 249 mg/dL — AB (ref 0–200)
HDL: 102.8 mg/dL (ref >39.00)
LDL CALC: 138 mg/dL — AB (ref 0–99)
NonHDL: 146.53
TRIGLYCERIDES: 45 mg/dL (ref 0.0–149.0)
Total CHOL/HDL Ratio: 2
VLDL: 9 mg/dL (ref 0.0–40.0)

## 2017-04-12 LAB — TSH: TSH: 1.67 u[IU]/mL (ref 0.35–4.50)

## 2017-04-12 NOTE — Progress Notes (Signed)
Subjective:     Jennifer Stokes is a 61 y.o. female and is here for a comprehensive physical exam. The patient reports problems - L knee pain--- she has had steroid injections .  Social History   Social History  . Marital status: Married    Spouse name: N/A  . Number of children: N/A  . Years of education: N/A   Occupational History  . financial planner    Social History Main Topics  . Smoking status: Never Smoker  . Smokeless tobacco: Never Used  . Alcohol use No  . Drug use: No  . Sexual activity: Yes    Partners: Male   Other Topics Concern  . Not on file   Social History Narrative  . No narrative on file   Health Maintenance  Topic Date Due  . INFLUENZA VACCINE  05/16/2017  . PAP SMEAR  04/07/2018  . MAMMOGRAM  07/07/2018  . TETANUS/TDAP  10/02/2021  . COLONOSCOPY  03/03/2024  . Hepatitis C Screening  Completed  . HIV Screening  Addressed    The following portions of the patient's history were reviewed and updated as appropriate: She  has a past medical history of ADD (attention deficit disorder). She  does not have any pertinent problems on file. She  has a past surgical history that includes Cosmetic surgery. Her family history includes Bipolar disorder in her son. She  reports that she has never smoked. She has never used smokeless tobacco. She reports that she does not drink alcohol or use drugs. She has a current medication list which includes the following prescription(s): methylphenidate and sertraline. Current Outpatient Prescriptions on File Prior to Visit  Medication Sig Dispense Refill  . methylphenidate (RITALIN) 20 MG tablet Take 20 mg by mouth 4 (four) times daily.    . sertraline (ZOLOFT) 25 MG tablet TAKE 1 BY MOUTH DAILY 90 tablet 3   No current facility-administered medications on file prior to visit.    She has No Known Allergies..  Review of Systems Review of Systems  Constitutional: Negative for activity change, appetite change and  fatigue.  HENT: Negative for hearing loss, congestion, tinnitus and ear discharge.  dentist [id number] Eyes: Negative for visual disturbance (see optho q1y -- vision corrected to 20/20 with glasses).  Respiratory: Negative for cough, chest tightness and shortness of breath.   Cardiovascular: Negative for chest pain, palpitations and leg swelling.  Gastrointestinal: Negative for abdominal pain, diarrhea, constipation and abdominal distention.  Genitourinary: Negative for urgency, frequency, decreased urine volume and difficulty urinating.  Musculoskeletal: Negative for back pain, arthralgias and gait problem.  Skin: Negative for color change, pallor and rash.  Neurological: Negative for dizziness, light-headedness, numbness and headaches.  Hematological: Negative for adenopathy. Does not bruise/bleed easily.  Psychiatric/Behavioral: Negative for suicidal ideas, confusion, sleep disturbance, self-injury, dysphoric mood, decreased concentration and agitation.      Objective:    BP 101/60   Pulse 81   Temp 98.8 F (37.1 C) (Oral)   Ht 5\' 7"  (1.702 m)   Wt 141 lb (64 kg)   SpO2 100%   BMI 22.08 kg/m  General appearance: alert, cooperative, appears stated age and no distress Head: Normocephalic, without obvious abnormality, atraumatic Eyes: conjunctivae/corneas clear. PERRL, EOM's intact. Fundi benign. Ears: normal TM's and external ear canals both ears Nose: Nares normal. Septum midline. Mucosa normal. No drainage or sinus tenderness. Throat: lips, mucosa, and tongue normal; teeth and gums normal Neck: no adenopathy, no carotid bruit, no JVD, supple, symmetrical,  trachea midline and thyroid not enlarged, symmetric, no tenderness/mass/nodules Back: symmetric, no curvature. ROM normal. No CVA tenderness. Lungs: clear to auscultation bilaterally Breasts: deferred Heart: regular rate and rhythm, S1, S2 normal, no murmur, click, rub or gallop Abdomen: soft, non-tender; bowel sounds normal; no  masses,  no organomegaly Pelvic: deferred Extremities: extremities normal, atraumatic, no cyanosis or edema--  Ms-- L knee pain with weight bearing  Pulses: 2+ and symmetric Skin: Skin color, texture, turgor normal. No rashes or lesions Lymph nodes: Cervical, supraclavicular, and axillary nodes normal. Neurologic: Alert and oriented X 3, normal strength and tone. Normal symmetric reflexes. Normal coordination and gait    Assessment:    Healthy female exam.      Plan:    ghm utd Check labs See After Visit Summary for Counseling Recommendations    1. Chronic pain of left knee  - Ambulatory referral to Sports Medicine  2. Preventative health care See above - CBC with Differential/Platelet - Lipid panel - Comprehensive metabolic panel - TSH - POCT Urinalysis Dipstick (Automated)

## 2017-04-12 NOTE — Patient Instructions (Signed)
Preventive Care 40-64 Years, Female Preventive care refers to lifestyle choices and visits with your health care provider that can promote health and wellness. What does preventive care include?  A yearly physical exam. This is also called an annual well check.  Dental exams once or twice a year.  Routine eye exams. Ask your health care provider how often you should have your eyes checked.  Personal lifestyle choices, including: ? Daily care of your teeth and gums. ? Regular physical activity. ? Eating a healthy diet. ? Avoiding tobacco and drug use. ? Limiting alcohol use. ? Practicing safe sex. ? Taking low-dose aspirin daily starting at age 58. ? Taking vitamin and mineral supplements as recommended by your health care provider. What happens during an annual well check? The services and screenings done by your health care provider during your annual well check will depend on your age, overall health, lifestyle risk factors, and family history of disease. Counseling Your health care provider may ask you questions about your:  Alcohol use.  Tobacco use.  Drug use.  Emotional well-being.  Home and relationship well-being.  Sexual activity.  Eating habits.  Work and work Statistician.  Method of birth control.  Menstrual cycle.  Pregnancy history.  Screening You may have the following tests or measurements:  Height, weight, and BMI.  Blood pressure.  Lipid and cholesterol levels. These may be checked every 5 years, or more frequently if you are over 81 years old.  Skin check.  Lung cancer screening. You may have this screening every year starting at age 78 if you have a 30-pack-year history of smoking and currently smoke or have quit within the past 15 years.  Fecal occult blood test (FOBT) of the stool. You may have this test every year starting at age 65.  Flexible sigmoidoscopy or colonoscopy. You may have a sigmoidoscopy every 5 years or a colonoscopy  every 10 years starting at age 30.  Hepatitis C blood test.  Hepatitis B blood test.  Sexually transmitted disease (STD) testing.  Diabetes screening. This is done by checking your blood sugar (glucose) after you have not eaten for a while (fasting). You may have this done every 1-3 years.  Mammogram. This may be done every 1-2 years. Talk to your health care provider about when you should start having regular mammograms. This may depend on whether you have a family history of breast cancer.  BRCA-related cancer screening. This may be done if you have a family history of breast, ovarian, tubal, or peritoneal cancers.  Pelvic exam and Pap test. This may be done every 3 years starting at age 80. Starting at age 36, this may be done every 5 years if you have a Pap test in combination with an HPV test.  Bone density scan. This is done to screen for osteoporosis. You may have this scan if you are at high risk for osteoporosis.  Discuss your test results, treatment options, and if necessary, the need for more tests with your health care provider. Vaccines Your health care provider may recommend certain vaccines, such as:  Influenza vaccine. This is recommended every year.  Tetanus, diphtheria, and acellular pertussis (Tdap, Td) vaccine. You may need a Td booster every 10 years.  Varicella vaccine. You may need this if you have not been vaccinated.  Zoster vaccine. You may need this after age 5.  Measles, mumps, and rubella (MMR) vaccine. You may need at least one dose of MMR if you were born in  1956 or later. You may also need a second dose.  Pneumococcal 13-valent conjugate (PCV13) vaccine. You may need this if you have certain conditions and were not previously vaccinated.  Pneumococcal polysaccharide (PPSV23) vaccine. You may need one or two doses if you smoke cigarettes or if you have certain conditions.  Meningococcal vaccine. You may need this if you have certain  conditions.  Hepatitis A vaccine. You may need this if you have certain conditions or if you travel or work in places where you may be exposed to hepatitis A.  Hepatitis B vaccine. You may need this if you have certain conditions or if you travel or work in places where you may be exposed to hepatitis B.  Haemophilus influenzae type b (Hib) vaccine. You may need this if you have certain conditions.  Talk to your health care provider about which screenings and vaccines you need and how often you need them. This information is not intended to replace advice given to you by your health care provider. Make sure you discuss any questions you have with your health care provider. Document Released: 10/29/2015 Document Revised: 06/21/2016 Document Reviewed: 08/03/2015 Elsevier Interactive Patient Education  2017 [employer].

## 2017-04-13 ENCOUNTER — Encounter: Payer: Self-pay | Admitting: Family Medicine

## 2017-05-08 ENCOUNTER — Ambulatory Visit: Payer: Self-pay

## 2017-05-08 ENCOUNTER — Encounter: Payer: Self-pay | Admitting: Family Medicine

## 2017-05-08 ENCOUNTER — Ambulatory Visit (INDEPENDENT_AMBULATORY_CARE_PROVIDER_SITE_OTHER): Payer: BLUE CROSS/BLUE SHIELD | Admitting: Family Medicine

## 2017-05-08 VITALS — BP 112/72 | HR 79 | Ht 67.0 in | Wt 142.0 lb

## 2017-05-08 DIAGNOSIS — G8929 Other chronic pain: Secondary | ICD-10-CM

## 2017-05-08 DIAGNOSIS — M1712 Unilateral primary osteoarthritis, left knee: Secondary | ICD-10-CM

## 2017-05-08 DIAGNOSIS — M25562 Pain in left knee: Secondary | ICD-10-CM | POA: Diagnosis not present

## 2017-05-08 MED ORDER — VITAMIN D (ERGOCALCIFEROL) 1.25 MG (50000 UNIT) PO CAPS: 50000.0000 [IU] | ORAL_CAPSULE | ORAL | 0 refills | Status: DC

## 2017-05-08 MED ORDER — DICLOFENAC SODIUM 2 % TD SOLN: 2.0000 "application " | Freq: Two times a day (BID) | TRANSDERMAL | 3 refills | Status: DC

## 2017-05-08 NOTE — Progress Notes (Signed)
[name] [address] [place], [place] [address] Phone: [phone] Subjective:    I'm seeing this patient by the request  of:  [name], DO   CC: left knee pain   [id number]  Jennifer Stokes is a 61 y.o. female coming in with complaint of Left knee pain. History of meniscal injury and needed surgery. Patient has seen other providers for this and has not responded to conservative therapy in the past. Patient is wondering what else can be done. Avid tennis player. Sometimes feel like there is locking and giving out on her. More pain over the medial aspect. Aching sensation with a sharp pain.Better with rest and is taking ibuprofen regularly. Severity 6 out of 10 and worsening     Past Medical History:  Diagnosis Date  . ADD (attention deficit disorder)    Past Surgical History:  Procedure Laterality Date  . COSMETIC SURGERY     Social History   Social History  . Marital status: Married    Spouse name: N/A  . Number of children: N/A  . Years of education: N/A   Occupational History  . financial planner    Social History Main Topics  . Smoking status: Never Smoker  . Smokeless tobacco: Never Used  . Alcohol use No  . Drug use: No  . Sexual activity: Yes    Partners: Male   Other Topics Concern  . None   Social History Narrative  . None   No Known Allergies Family History  Problem Relation Age of Onset  . Diabetes Neg Hx   . Heart disease Neg Hx   . Hyperlipidemia Neg Hx   . Hypertension Neg Hx   . Stroke Neg Hx   . Bipolar disorder Son      Past medical history, social, surgical and family history all reviewed in electronic medical record.  No pertanent information unless stated regarding to the chief complaint.   Review of Systems:Review of systems updated and as accurate as of 05/08/17  No headache, visual changes, nausea, vomiting, diarrhea, constipation, dizziness, abdominal pain, skin rash, fevers, chills,  night sweats, weight loss, swollen lymph nodes, body aches, joint swelling,  chest pain, shortness of breath, mood changes. Positive muscle aches  Objective  Blood pressure 112/72, pulse 79, height 5\' 7"  (1.702 m), weight 142 lb (64.4 kg), SpO2 97 %. Systems examined below as of 05/08/17   General: No apparent distress alert and oriented x3 mood and affect normal, dressed appropriately.  HEENT: Pupils equal, extraocular movements intact  Respiratory: Patient's speak in full sentences and does not appear short of breath  Cardiovascular: No lower extremity edema, non tender, no erythema  Skin: Warm dry intact with no signs of infection or rash on extremities or on axial skeleton.  Abdomen: Soft nontender  Neuro: Cranial nerves II through XII are intact, neurovascularly intact in all extremities with 2+ DTRs and 2+ pulses.  Lymph: No lymphadenopathy of posterior or anterior cervical chain or axillae bilaterally.  Gait normal with good balance and coordination.  MSK:  Non tender with full range of motion and good stability and symmetric strength and tone of shoulders, elbows, wrist, hip, and ankles bilaterally.  Knee: Left No significant deformity Tender to palpation over medial and PF joint line.  ROM full in flexion and extension and lower leg rotation. instability with valgus force very mild low painful patellar compression. Patellar glide with moderate crepitus. Patellar and quadriceps tendons unremarkable.  Hamstring and quadriceps strength is normal. Contralateral knee shows minimal arthritic changes and no instability  MSK US performed of: Left knee This study was ordered, performed, and interpreted by Terrilee FilesZach Ellory Khurana D.O.  Knee: All structures visualized. Severe degenerative changes and postoperative changes of the medial meniscus. 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 Severe osteophytic changes of the medial compartment of any  moderate changes of the patellofemoral Patellar Tendon unremarkable on long and transverse views without effusion. No abnormality of prepatellar bursa. LCL and MCL unremarkable on long and transverse views. No abnormality of origin of medial or lateral head of the gastrocnemius.  IMPRESSION:  Moderate to severe arthritic changes of the knee  After informed written and verbal consent, patient was seated on exam table. Left knee was prepped with alcohol swab and utilizing anterolateral approach, patient's left knee space was injected with 4:1  marcaine 0.5%: Kenalog 40mg /dL. Patient tolerated the procedure well without immediate complications..    Impression and Recommendations:     This case required medical decision making of moderate complexity.      Note: This dictation was prepared with Dragon dictation along with smaller phrase technology. Any transcriptional errors that result from this process are unintentional.

## 2017-05-08 NOTE — Patient Instructions (Addendum)
Good to see you.  Ice 20 minutes 2 times daily. Usually after activity and before bed. Exercises 3 times a week.  pennsaid pinkie amount topically 2 times daily as needed.  Once weekly vitamin D for 12 weeks.  Over the counter Turmeric 500mg  twice daily  Tart cherry extract any dose at night Continue the brace with a lot of activity  See me again in 4 weeks and if not better we will do Synvisc.

## 2017-05-08 NOTE — Assessment & Plan Note (Addendum)
Patient given injection today. Home exercises, topical anti-inflammatory's prescribed as well as once weekly vitamin D for strength and endurance. Patient is going to start increasing activity. Continue bracing. Follow-up with me in 4 weeks and could be a candidate for viscous supplementation.

## 2017-05-14 ENCOUNTER — Encounter: Payer: Self-pay | Admitting: Family Medicine

## 2017-06-20 ENCOUNTER — Encounter: Payer: Self-pay | Admitting: Family Medicine

## 2017-06-20 ENCOUNTER — Ambulatory Visit (INDEPENDENT_AMBULATORY_CARE_PROVIDER_SITE_OTHER): Payer: BLUE CROSS/BLUE SHIELD | Admitting: Family Medicine

## 2017-06-20 DIAGNOSIS — Z23 Encounter for immunization: Secondary | ICD-10-CM | POA: Diagnosis not present

## 2017-06-20 DIAGNOSIS — M1712 Unilateral primary osteoarthritis, left knee: Secondary | ICD-10-CM | POA: Diagnosis not present

## 2017-06-20 DIAGNOSIS — F419 Anxiety disorder, unspecified: Secondary | ICD-10-CM | POA: Diagnosis not present

## 2017-06-20 NOTE — Assessment & Plan Note (Signed)
Patient is failed conservative therapy including steroid injection 6 weeks ago. Started on viscous supplementation today. But do believe with patients age she will do well with this. We discussed continuing to stay active and continuing the brace. Declined custom bracing at this time even though patient does have some instability. Follow-up Again in 1 week for second in a series of 3 injections.

## 2017-06-20 NOTE — Patient Instructions (Signed)
Good to see you  We started Synvisc. I hope it starts to help but usually the 2nd injection is where we see improvement.  Continue everything else Follow the pennsaid with lotion 15 minutes after if you can to help with the dry skin.  See you next

## 2017-06-20 NOTE — Progress Notes (Signed)
[name] [address] [place], [place] [address] Phone: [phone] Subjective:    I'm seeing this patient by the request  of:  [name], DO   CC: left knee pain f/u  [id number]  Jennifer Stokes is a 61 y.o. female coming in with complaint of Left knee pain. History of meniscal injury and needed surgery. Seen by me and did have severe osteophytic changes of the knee. Given injection was to do home exercises, icing regimen and topical anti-inflammatories. Patient is failed all other conservative therapy. Patient states first 2 weeks was feeling significant a better after the injection but is now having worsening pain again. Not as much swelling. Still having some instability. Rates the severity of pain a 7 out of 10.     Past Medical History:  Diagnosis Date  . ADD (attention deficit disorder)    Past Surgical History:  Procedure Laterality Date  . COSMETIC SURGERY     Social History   Social History  . Marital status: Married    Spouse name: N/A  . Number of children: N/A  . Years of education: N/A   Occupational History  . financial planner    Social History Main Topics  . Smoking status: Never Smoker  . Smokeless tobacco: Never Used  . Alcohol use No  . Drug use: No  . Sexual activity: Yes    Partners: Male   Other Topics Concern  . None   Social History Narrative  . None   No Known Allergies Family History  Problem Relation Age of Onset  . Diabetes Neg Hx   . Heart disease Neg Hx   . Hyperlipidemia Neg Hx   . Hypertension Neg Hx   . Stroke Neg Hx   . Bipolar disorder Son      Past medical history, social, surgical and family history all reviewed in electronic medical record.  No pertanent information unless stated regarding to the chief complaint.   Review of Systems: No headache, visual changes, nausea, vomiting, diarrhea, constipation, dizziness, abdominal pain, skin rash, fevers, chills, night sweats,  weight loss, swollen lymph nodes, body aches, joint swelling, muscle aches, chest pain, shortness of breath, mood changes.  Positive muscle aches  Objective  Blood pressure 98/78, pulse 87, height 5\' 7"  (1.702 m), weight 142 lb (64.4 kg), SpO2 98 %.   Systems examined below as of 06/20/17 General: NAD A&O x3 mood, affect normal  HEENT: Pupils equal, extraocular movements intact no nystagmus Respiratory: not short of breath at rest or with speaking Cardiovascular: No lower extremity edema, non tender Skin: Warm dry intact with no signs of infection or rash on extremities or on axial skeleton. Abdomen: Soft nontender, no masses Neuro: Cranial nerves  intact, neurovascularly intact in all extremities with 2+ DTRs and 2+ pulses. Lymph: No lymphadenopathy appreciated today  Gait normal with good balance and coordination.  MSK: Non tender with full range of motion and good stability and symmetric strength and tone of shoulders, elbows, wrist,  knee hips and ankles bilaterally.   Knee: Left valgus deformity noted. Abnormal thigh to calf ratio.  Tender to palpation over medial and PF joint line.  ROM full in flexion and extension and lower leg rotation. instability with valgus force.  painful patellar compression. Patellar glide with moderate crepitus. Patellar and quadriceps tendons unremarkable. Hamstring and quadriceps strength is normal. Contralateral knee shows no arthritic changes  After informed written and verbal consent, patient was seated  on exam table. Left knee was prepped with alcohol swab and utilizing anterolateral approach, patient's left knee space was injected with 16 mg/2.5 mL of Synvisc (sodium hyaluronate) in a prefilled syringe was injected easily into the knee through a 22-gauge needle. Patient tolerated the procedure well without immediate complications.      Impression and Recommendations:     This case required medical decision making of moderate  complexity.      Note: This dictation was prepared with Dragon dictation along with smaller phrase technology. Any transcriptional errors that result from this process are unintentional.

## 2017-06-21 DIAGNOSIS — H43813 Vitreous degeneration, bilateral: Secondary | ICD-10-CM | POA: Diagnosis not present

## 2017-06-21 DIAGNOSIS — H524 Presbyopia: Secondary | ICD-10-CM | POA: Diagnosis not present

## 2017-06-21 DIAGNOSIS — H5213 Myopia, bilateral: Secondary | ICD-10-CM | POA: Diagnosis not present

## 2017-06-23 DIAGNOSIS — F411 Generalized anxiety disorder: Secondary | ICD-10-CM | POA: Diagnosis not present

## 2017-06-23 DIAGNOSIS — F900 Attention-deficit hyperactivity disorder, predominantly inattentive type: Secondary | ICD-10-CM | POA: Diagnosis not present

## 2017-06-25 NOTE — Progress Notes (Signed)
[facility] D[facility] [address] [place], [place] [address] Phone: [phone] Subjective:    I'm seeing this patient by the request  of:  Donato SchultzLowne Chase, Yvonne R, DO   CC: left knee pain f/u  BJY:[id number]:Subjective  Jennifer Stokes is a 61 y.o. female coming in with complaint of Left knee pain. Known arthritis of the knee. Failed all conservative therapy. Here for second in a series of 3 injections for viscous supplementation. Patient states Minimal improvement over     Past Medical History:  Diagnosis Date  . ADD (attention deficit disorder)    Past Surgical History:  Procedure Laterality Date  . COSMETIC SURGERY     Social History   Social History  . Marital status: Married    Spouse name: N/A  . Number of children: N/A  . Years of education: N/A   Occupational History  . financial planner    Social History Main Topics  . Smoking status: Never Smoker  . Smokeless tobacco: Never Used  . Alcohol use No  . Drug use: No  . Sexual activity: Yes    Partners: Male   Other Topics Concern  . Not on file   Social History Narrative  . No narrative on file   No Known Allergies Family History  Problem Relation Age of Onset  . Diabetes Neg Hx   . Heart disease Neg Hx   . Hyperlipidemia Neg Hx   . Hypertension Neg Hx   . Stroke Neg Hx   . Bipolar disorder Son      Past medical history, social, surgical and family history all reviewed in electronic medical record.  No pertanent information unless stated regarding to the chief complaint.   Review of Systems: No headache, visual changes, nausea, vomiting, diarrhea, constipation, dizziness, abdominal pain, skin rash, fevers, chills, night sweats, weight loss, swollen lymph nodes, body aches, joint swelling, muscle aches, chest pain, shortness of breath, mood changes.  Positive muscle aches  Objective  There were no vitals taken for this visit.   Systems examined below as of 06/25/17 General: NAD A&O x3  mood, affect normal  HEENT: Pupils equal, extraocular movements intact no nystagmus Respiratory: not short of breath at rest or with speaking Cardiovascular: No lower extremity edema, non tender Skin: Warm dry intact with no signs of infection or rash on extremities or on axial skeleton. Abdomen: Soft nontender, no masses Neuro: Cranial nerves  intact, neurovascularly intact in all extremities with 2+ DTRs and 2+ pulses. Lymph: No lymphadenopathy appreciated today  Gait normal with good balance and coordination.  MSK: Non tender with full range of motion and good stability and symmetric strength and tone of shoulders, elbows, wrist,  knee hips and ankles bilaterally.   Knee: Left valgus deformity noted. Abnormal thigh to calf ratio.  Tender to palpation over medial and PF joint line.  ROM full in flexion and extension and lower leg rotation. instability with valgus force.  painful patellar compression. Patellar glide with moderate crepitus. Patellar and quadriceps tendons unremarkable. Hamstring and quadriceps strength is normal. Contralateral knee shows no arthritic changes   After informed written and verbal consent, patient was seated on exam table. Left knee was prepped with alcohol swab and utilizing anterolateral approach, patient's left knee space was injected with 16 mg/2.5 mL of Synvisc (sodium hyaluronate) in a prefilled syringe was injected easily into the knee through a 22-gauge needle.. Patient tolerated the procedure well without immediate complications.  Impression and Recommendations:     This case required medical decision making of moderate complexity.      Note: This dictation was prepared with Dragon dictation along with smaller phrase technology. Any transcriptional errors that result from this process are unintentional.

## 2017-06-26 ENCOUNTER — Ambulatory Visit (INDEPENDENT_AMBULATORY_CARE_PROVIDER_SITE_OTHER): Payer: BLUE CROSS/BLUE SHIELD | Admitting: Family Medicine

## 2017-06-26 ENCOUNTER — Encounter: Payer: Self-pay | Admitting: Family Medicine

## 2017-06-26 DIAGNOSIS — M1712 Unilateral primary osteoarthritis, left knee: Secondary | ICD-10-CM

## 2017-06-26 NOTE — Patient Instructions (Signed)
Sorry for the delay.  Ice is your friend Keep up with everything else you are doing.  See me again in 1 week for the final injection!!!

## 2017-06-26 NOTE — Assessment & Plan Note (Signed)
Second in a series of 3 injections given today. We discussed icing regimen and home exercises. We discussed which activities to do an which was to avoid. Patient follow-up with me again in 1 week for third and final injection

## 2017-07-03 ENCOUNTER — Encounter: Payer: Self-pay | Admitting: Family Medicine

## 2017-07-04 ENCOUNTER — Telehealth: Payer: Self-pay

## 2017-07-04 DIAGNOSIS — F419 Anxiety disorder, unspecified: Secondary | ICD-10-CM | POA: Diagnosis not present

## 2017-07-04 NOTE — Telephone Encounter (Signed)
Called patient as she sent a MyChart message needing to reschedule. Rescheduled patient for following week on  07/10/17.

## 2017-07-05 ENCOUNTER — Ambulatory Visit: Payer: BLUE CROSS/BLUE SHIELD | Admitting: Family Medicine

## 2017-07-10 ENCOUNTER — Encounter: Payer: Self-pay | Admitting: Family Medicine

## 2017-07-10 ENCOUNTER — Ambulatory Visit (INDEPENDENT_AMBULATORY_CARE_PROVIDER_SITE_OTHER): Payer: BLUE CROSS/BLUE SHIELD | Admitting: Family Medicine

## 2017-07-10 ENCOUNTER — Other Ambulatory Visit: Payer: Self-pay

## 2017-07-10 DIAGNOSIS — M1712 Unilateral primary osteoarthritis, left knee: Secondary | ICD-10-CM | POA: Diagnosis not present

## 2017-07-10 MED ORDER — VITAMIN D (ERGOCALCIFEROL) 1.25 MG (50000 UNIT) PO CAPS: 50000.0000 [IU] | ORAL_CAPSULE | ORAL | 0 refills | Status: DC

## 2017-07-10 MED ORDER — DOXYCYCLINE HYCLATE 100 MG PO TABS: 100.0000 mg | ORAL_TABLET | Freq: Two times a day (BID) | ORAL | 0 refills | Status: AC

## 2017-07-10 NOTE — Assessment & Plan Note (Signed)
Final synvisc injection given today.  ICe noted  HEP  RTC in 4 weeks

## 2017-07-10 NOTE — Patient Instructions (Signed)
Good to see you  Overall doing well.  Finished synvisc and expect it to get better over the next month.  Doxycycline  2 times a day for 2 weeks See me again in 4-6 weeks.

## 2017-07-10 NOTE — Progress Notes (Signed)
[name] [address] [place], [place] [address] Phone: [phone] Subjective:    I'm seeing this patient by the request  of:    CC: Left knee follow-up  [id number]  Jennifer Stokes is a 61 y.o. female coming in with complaint of left knee pain. Found to have degenerative changes of the left knee. Discussed with patient at great length about icing regimen, home exercises. Patient and failed all conservative therapy. Patient is here for third and final viscous supplementation. Recently did have the loss of her father.      Past Medical History:  Diagnosis Date  . ADD (attention deficit disorder)    Past Surgical History:  Procedure Laterality Date  . COSMETIC SURGERY     Social History   Social History  . Marital status: Married    Spouse name: N/A  . Number of children: N/A  . Years of education: N/A   Occupational History  . financial planner    Social History Main Topics  . Smoking status: Never Smoker  . Smokeless tobacco: Never Used  . Alcohol use No  . Drug use: No  . Sexual activity: Yes    Partners: Male   Other Topics Concern  . None   Social History Narrative  . None   No Known Allergies Family History  Problem Relation Age of Onset  . Diabetes Neg Hx   . Heart disease Neg Hx   . Hyperlipidemia Neg Hx   . Hypertension Neg Hx   . Stroke Neg Hx   . Bipolar disorder Son      Past medical history, social, surgical and family history all reviewed in electronic medical record.  No pertanent information unless stated regarding to the chief complaint.   Review of Systems:Review of systems updated and as accurate as of 07/10/17  No headache, visual changes, nausea, vomiting, diarrhea, constipation, dizziness, abdominal pain, skin rash, fevers, chills, night sweats, weight loss, swollen lymph nodes, body aches, joint swelling, chest pain, shortness of breath, mood changes. Positive muscle aches  Objective  Blood  pressure 100/70, pulse 98, height  (1.702 m), weight 143 lb (64.9 kg), SpO2 97 %. Systems examined below as of 07/10/17   General: No apparent distress alert and oriented x3 mood and affect normal, dressed appropriately.  HEENT: Pupils equal, extraocular movements intact  Respiratory: Patient's speak in full sentences and does not appear short of breath  Cardiovascular: No lower extremity edema, non tender, no erythema  Skin: Warm dry intact with no signs of infection or rash on extremities or on axial skeleton. Mild redness from knee.  Abdomen: Soft nontender  Neuro: Cranial nerves II through XII are intact, neurovascularly intact in all extremities with 2+ DTRs and 2+ pulses.  Lymph: No lymphadenopathy of posterior or anterior cervical chain or axillae bilaterally.  Gait normal with good balance and coordination.  MSK:  Non tender with full range of motion and good stability and symmetric strength and tone of shoulders, elbows, wrist, hip, and ankles bilaterally.  Knee: Left valgus deformity noted. Large thigh to calf ratio.  Tender to palpation over medial and PF joint line.  ROM full in flexion and extension and lower leg rotation. instability with valgus force.  painful patellar compression. Patellar glide with moderate crepitus. Patellar and quadriceps tendons unremarkable. Hamstring and quadriceps strength is normal. Contralateral knee shows mild arthritic changes of the right knee  After informed written and verbal consent, patient was seated on  exam table. Left knee was prepped with alcohol swab and utilizing anterolateral approach, patient's left knee space was injected with 4:1  marcaine 0.5%: Kenalog /dL. Patient tolerated the procedure well without immediate complications.   Impression and Recommendations:     This case required medical decision making of moderate complexity.      Note: This dictation was prepared with Dragon dictation along with smaller  phrase technology. Any transcriptional errors that result from this process are unintentional.

## 2017-07-13 DIAGNOSIS — F419 Anxiety disorder, unspecified: Secondary | ICD-10-CM | POA: Diagnosis not present

## 2017-08-01 ENCOUNTER — Other Ambulatory Visit: Payer: Self-pay | Admitting: Family Medicine

## 2017-08-02 DIAGNOSIS — F419 Anxiety disorder, unspecified: Secondary | ICD-10-CM | POA: Diagnosis not present

## 2017-08-07 ENCOUNTER — Other Ambulatory Visit: Payer: Self-pay | Admitting: Family Medicine

## 2017-08-07 ENCOUNTER — Ambulatory Visit
Admission: RE | Admit: 2017-08-07 | Discharge: 2017-08-07 | Disposition: A | Discharge status: Home / Self Care | Payer: BLUE CROSS/BLUE SHIELD | Source: Ambulatory Visit | Attending: Family Medicine | Admitting: Family Medicine

## 2017-08-07 DIAGNOSIS — Z1231 Encounter for screening mammogram for malignant neoplasm of breast: Secondary | ICD-10-CM

## 2017-08-14 ENCOUNTER — Ambulatory Visit: Payer: BLUE CROSS/BLUE SHIELD | Admitting: Family Medicine

## 2017-08-20 ENCOUNTER — Ambulatory Visit (INDEPENDENT_AMBULATORY_CARE_PROVIDER_SITE_OTHER): Payer: BLUE CROSS/BLUE SHIELD | Admitting: Family Medicine

## 2017-08-20 ENCOUNTER — Ambulatory Visit (INDEPENDENT_AMBULATORY_CARE_PROVIDER_SITE_OTHER)
Admission: RE | Admit: 2017-08-20 | Discharge: 2017-08-20 | Disposition: A | Discharge status: Home / Self Care | Payer: BLUE CROSS/BLUE SHIELD | Source: Ambulatory Visit | Attending: Family Medicine | Admitting: Family Medicine

## 2017-08-20 ENCOUNTER — Encounter: Payer: Self-pay | Admitting: Family Medicine

## 2017-08-20 VITALS — BP 130/70 | HR 37 | Ht 67.0 in | Wt 146.0 lb

## 2017-08-20 DIAGNOSIS — M1712 Unilateral primary osteoarthritis, left knee: Secondary | ICD-10-CM | POA: Diagnosis not present

## 2017-08-20 DIAGNOSIS — M25562 Pain in left knee: Secondary | ICD-10-CM | POA: Diagnosis not present

## 2017-08-20 DIAGNOSIS — M179 Osteoarthritis of knee, unspecified: Secondary | ICD-10-CM | POA: Diagnosis not present

## 2017-08-20 NOTE — Assessment & Plan Note (Signed)
Discussed with patient in great length spent  25 minutes with patient face-to-face and had greater than 50% of counseling including as described in assessment and plan.  Discussed with patient to avoid certain activities.  We discussed the possibility of PRP which patient will consider.  Patient does not want any type of surgical intervention at this time.  Encouraged her to get the x-rays downstairs to make sure there is no other loose body that was not seen on ultrasound.  Patient has declined any custom bracing at the moment.  Patient will make the changes as appropriate.  Follow-up again in 4-6 weeks.

## 2017-08-20 NOTE — Progress Notes (Signed)
Jennifer ScaleZach Stokes D.O. [facility] [address] [place], [place] [address] Phone: [phone] Subjective:      CC: Left knee follow-up  [id number]:[id number]:Subjective  Jennifer Stokes is a 61 y.o. female coming in with complaint of left knee pain.  Found to have severe arthritis.  Continues Visco supplementation with some repeat x-ray that is now pending.  Patient was given a steroid injection for 6 weeks ago.  Started having increasing discomfort when playing tennis again.  States that the swelling is intermittent.  Wearing the brace occasionally. -     Past Medical History:  Diagnosis Date  . ADD (attention deficit disorder)    Past Surgical History:  Procedure Laterality Date  . COSMETIC SURGERY     Social History   Socioeconomic History  . Marital status: Married    Spouse name: None  . Number of children: None  . Years of education: None  . Highest education level: None  Social Needs  . Financial resource strain: None  . Food insecurity - worry: None  . Food insecurity - inability: None  . Transportation needs - medical: None  . Transportation needs - non-medical: None  Occupational History  . Occupation: Forensic scientistfinancial planner  Tobacco Use  . Smoking status: Never Smoker  . Smokeless tobacco: Never Used  Substance and Sexual Activity  . Alcohol use: No  . Drug use: No  . Sexual activity: Yes    Partners: Male  Other Topics Concern  . None  Social History Narrative  . None   No Known Allergies Family History  Problem Relation Age of Onset  . Bipolar disorder Son   . Diabetes Neg Hx   . Heart disease Neg Hx   . Hyperlipidemia Neg Hx   . Hypertension Neg Hx   . Stroke Neg Hx      Past medical history, social, surgical and family history all reviewed in electronic medical record.  No pertanent information unless stated regarding to the chief complaint.   Review of Systems:Review of systems updated and as accurate as of 08/20/17  No headache, visual changes, nausea,  vomiting, diarrhea, constipation, dizziness, abdominal pain, skin rash, fevers, chills, night sweats, weight loss, swollen lymph nodes, body aches, joint swelling, muscle aches, chest pain, shortness of breath, mood changes.   Objective  Blood pressure 130/70, pulse (!) 37, height 5\' 7"  (1.702 m), weight 146 lb (66.2 kg), SpO2 95 %. Systems examined below as of 08/20/17   General: No apparent distress alert and oriented x3 mood and affect normal, dressed appropriately.  HEENT: Pupils equal, extraocular movements intact  Respiratory: Patient's speak in full sentences and does not appear short of breath  Cardiovascular: No lower extremity edema, non tender, no erythema  Skin: Warm dry intact with no signs of infection or rash on extremities or on axial skeleton.  Abdomen: Soft nontender  Neuro: Cranial nerves II through XII are intact, neurovascularly intact in all extremities with 2+ DTRs and 2+ pulses.  Lymph: No lymphadenopathy of posterior or anterior cervical chain or axillae bilaterally.  Gait normal with good balance and coordination.  MSK:  Non tender with full range of motion and good stability and symmetric strength and tone of shoulders, elbows, wrist, hip, and ankles bilaterally.  Knee: Left  valgus deformity noted.  Abnormal thigh to calf ratio.  Tender to palpation over medial and PF joint line.  ROM full in flexion and extension and lower leg rotation. instability with valgus force.  painful patellar  compression. Patellar glide with moderate crepitus. Patellar and quadriceps tendons unremarkable. Hamstring and quadriceps strength is normal. Contralateral knee shows mild arhritis but no instability    Impression and Recommendations:     This case required medical decision making of moderate complexity.      Note: This dictation was prepared with Dragon dictation along with smaller phrase technology. Any transcriptional errors that result from this process are  unintentional.

## 2017-08-20 NOTE — Patient Instructions (Signed)
Good to see you  Consider the PRP Xray downstairs Lets see how we do another 4 weeks and then come back to see me

## 2017-08-24 DIAGNOSIS — F419 Anxiety disorder, unspecified: Secondary | ICD-10-CM | POA: Diagnosis not present

## 2017-09-19 DIAGNOSIS — F419 Anxiety disorder, unspecified: Secondary | ICD-10-CM | POA: Diagnosis not present

## 2017-10-14 ENCOUNTER — Encounter: Payer: Self-pay | Admitting: Family Medicine

## 2017-10-20 NOTE — Progress Notes (Signed)
[name] [address] [place], [place] [address] Phone: [phone] Subjective:      CC: Knee pain follow-up  [id number]  Jennifer Stokes is a 62 y.o. female coming in with complaint of the pain.  Found to have severe arthritis.  Found to have a meniscal tear.  Attempted Visco supplementation with no significant improvement.  Patient states continues to have pain. Failed all conservative therapy and is here for PRP injection.  Patient states still having pain        Past Medical History:  Diagnosis Date  . ADD (attention deficit disorder)    Past Surgical History:  Procedure Laterality Date  . COSMETIC SURGERY     Social History   Socioeconomic History  . Marital status: Married    Spouse name: Not on file  . Number of children: Not on file  . Years of education: Not on file  . Highest education level: Not on file  Social Needs  . Financial resource strain: Not on file  . Food insecurity - worry: Not on file  . Food insecurity - inability: Not on file  . Transportation needs - medical: Not on file  . Transportation needs - non-medical: Not on file  Occupational History  . Occupation: Forensic scientist  Tobacco Use  . Smoking status: Never Smoker  . Smokeless tobacco: Never Used  Substance and Sexual Activity  . Alcohol use: No  . Drug use: No  . Sexual activity: Yes    Partners: Male  Other Topics Concern  . Not on file  Social History Narrative  . Not on file   No Known Allergies Family History  Problem Relation Age of Onset  . Bipolar disorder Son   . Diabetes Neg Hx   . Heart disease Neg Hx   . Hyperlipidemia Neg Hx   . Hypertension Neg Hx   . Stroke Neg Hx      Past medical history, social, surgical and family history all reviewed in electronic medical record.  No pertanent information unless stated regarding to the chief complaint.   Review of Systems:Review of systems updated and as accurate as of 10/22/17  No headache, visual changes, nausea, vomiting, diarrhea, constipation, dizziness, abdominal pain, skin rash, fevers, chills, night sweats, weight loss, swollen lymph nodes, body aches, , chest pain, shortness of breath, mood changes.  Positive muscle aches and joint swelling  Objective  There were no vitals taken for this visit. Systems examined below as of 10/22/17   General: No apparent distress alert and oriented x3 mood and affect normal, dressed appropriately.  HEENT: Pupils equal, extraocular movements intact  Respiratory: Patient's speak in full sentences and does not appear short of breath  Cardiovascular: No lower extremity edema, non tender, no erythema  Skin: Warm dry intact with no signs of infection or rash on extremities or on axial skeleton.  Abdomen: Soft nontender  Neuro: Cranial nerves II through XII are intact, neurovascularly intact in all extremities with 2+ DTRs and 2+ pulses.  Lymph: No lymphadenopathy of posterior or anterior cervical chain or axillae bilaterally.  Gait normal with good balance and coordination.  MSK:  Non tender with full range of motion and good stability and symmetric strength and tone of shoulders, elbows, wrist, hip, and ankles bilaterally.  Knee: Left valgus deformity noted. Large thigh to calf ratio.  Tender to palpation over medial and PF joint line.  ROM full in flexion and extension and lower leg rotation. instability  with valgus force.  painful patellar compression. Patellar glide with moderate crepitus. Patellar and quadriceps tendons unremarkable. Hamstring and quadriceps strength is normal. Contralateral knee shows minimal arthritic changes  After informed written and verbal consent, patient was seated on exam table. Left knee was prepped with alcohol swab and utilizing anterolateral approach, patient's left knee space was injected with 2 cc of 0.5% Marcaine and then injected with a total of 4 cc of pre-centrifuge PRP.Marland Kitchen. Patient  tolerated the procedure well without immediate complications.   Impression and Recommendations:     This case required medical decision making of moderate complexity.      Note: This dictation was prepared with Dragon dictation along with smaller phrase technology. Any transcriptional errors that result from this process are unintentional.

## 2017-10-22 ENCOUNTER — Encounter: Payer: Self-pay | Admitting: Family Medicine

## 2017-10-22 ENCOUNTER — Ambulatory Visit (INDEPENDENT_AMBULATORY_CARE_PROVIDER_SITE_OTHER): Payer: Self-pay | Admitting: Family Medicine

## 2017-10-22 ENCOUNTER — Other Ambulatory Visit: Payer: Self-pay

## 2017-10-22 ENCOUNTER — Ambulatory Visit: Payer: Self-pay

## 2017-10-22 DIAGNOSIS — M1712 Unilateral primary osteoarthritis, left knee: Secondary | ICD-10-CM

## 2017-10-22 MED ORDER — VITAMIN D (ERGOCALCIFEROL) 1.25 MG (50000 UNIT) PO CAPS: 50000.0000 [IU] | ORAL_CAPSULE | ORAL | 0 refills | Status: DC

## 2017-10-22 NOTE — Patient Instructions (Signed)
You received a PRP injection today  Will take some time to see benefit.  Try if possible to avoid ibuprofen and ice for next 48 if not ideally 72 hours.  Tylenol is ok if needed for pain  Starting in 1 week on handout start the exercises and progress accordingly.  See me again in 3 weeks.

## 2017-10-22 NOTE — Assessment & Plan Note (Signed)
Patient given a PRP injection.  Tolerated the procedure well.  We discussed topical anti-inflammatories and bracing.  We discussed avoiding icing for the next 72 hours as well as the potential for ibuprofen.  Discussed which activities to do and given a return to sport slow progression.  Follow-up again in 3 weeks

## 2017-10-23 ENCOUNTER — Ambulatory Visit: Payer: BLUE CROSS/BLUE SHIELD | Admitting: Family Medicine

## 2017-10-26 ENCOUNTER — Ambulatory Visit: Payer: Self-pay | Admitting: Family Medicine

## 2017-11-06 DIAGNOSIS — F419 Anxiety disorder, unspecified: Secondary | ICD-10-CM | POA: Diagnosis not present

## 2017-11-07 ENCOUNTER — Other Ambulatory Visit: Payer: Self-pay

## 2017-11-07 ENCOUNTER — Ambulatory Visit (INDEPENDENT_AMBULATORY_CARE_PROVIDER_SITE_OTHER): Payer: BLUE CROSS/BLUE SHIELD | Admitting: Family Medicine

## 2017-11-07 DIAGNOSIS — M1712 Unilateral primary osteoarthritis, left knee: Secondary | ICD-10-CM | POA: Diagnosis not present

## 2017-11-07 MED ORDER — DICLOFENAC SODIUM 2 % TD SOLN: 2.0000 g | Freq: Two times a day (BID) | TRANSDERMAL | 3 refills | Status: DC

## 2017-11-07 MED ORDER — DICLOFENAC SODIUM 2 % TD SOLN: 2.0000 "application " | Freq: Two times a day (BID) | TRANSDERMAL | 3 refills | Status: DC

## 2017-11-07 NOTE — Assessment & Plan Note (Signed)
Doing significantly better with the PRP injection.  Continue to return to play progression.  Patient wants to be playing tennis and will start hitting drills.  Continue the vitamin D supplementation, topical anti-inflammatories and icing regimen.  Follow-up again with me in 4 weeks

## 2017-11-07 NOTE — Patient Instructions (Signed)
Good to see you  Jennifer Stokes is your friend after playing tennis  OK to do hitting drills for 1-02 weeks and then no restriction Always wear the brace Worsening pain use the pennsaid 2 times a day for 3 days no matter what  [facility]  See me again in 4 weeks

## 2017-11-07 NOTE — Progress Notes (Signed)
[name] [address] [place], [place] [address] Phone: [phone] Subjective:      CC: Knee pain follow-up  [id number]  Jennifer Stokes is a 62 y.o. female coming in with complaint of left knee pain.  Found to have severe osteoarthritic changes.  Failed all conservative therapy including Visco supplementation.  Was given a PRP injection.  Patient was to continue with conservative therapy.  Patient states she is feeling quite well.  Has not noticed as much swelling.  Patient denies any pain with regular daily activities.  Patient's wants to get back to tennis     Past Medical History:  Diagnosis Date  . ADD (attention deficit disorder)    Past Surgical History:  Procedure Laterality Date  . COSMETIC SURGERY     Social History   Socioeconomic History  . Marital status: Married    Spouse name: Not on file  . Number of children: Not on file  . Years of education: Not on file  . Highest education level: Not on file  Social Needs  . Financial resource strain: Not on file  . Food insecurity - worry: Not on file  . Food insecurity - inability: Not on file  . Transportation needs - medical: Not on file  . Transportation needs - non-medical: Not on file  Occupational History  . Occupation: Forensic scientist  Tobacco Use  . Smoking status: Never Smoker  . Smokeless tobacco: Never Used  Substance and Sexual Activity  . Alcohol use: No  . Drug use: No  . Sexual activity: Yes    Partners: Male  Other Topics Concern  . Not on file  Social History Narrative  . Not on file   No Known Allergies Family History  Problem Relation Age of Onset  . Bipolar disorder Son   . Diabetes Neg Hx   . Heart disease Neg Hx   . Hyperlipidemia Neg Hx   . Hypertension Neg Hx   . Stroke Neg Hx      Past medical history, social, surgical and family history all reviewed in electronic medical record.  No pertanent information unless stated regarding to the  chief complaint.   Review of Systems:Review of systems updated and as accurate as of 11/07/17  No headache, visual changes, nausea, vomiting, diarrhea, constipation, dizziness, abdominal pain, skin rash, fevers, chills, night sweats, weight loss, swollen lymph nodes, body aches, joint swelling, muscle aches, chest pain, shortness of breath, mood changes.   Objective  There were no vitals taken for this visit. Systems examined below as of 11/07/17   General: No apparent distress alert and oriented x3 mood and affect normal, dressed appropriately.  HEENT: Pupils equal, extraocular movements intact  Respiratory: Patient's speak in full sentences and does not appear short of breath  Cardiovascular: No lower extremity edema, non tender, no erythema  Skin: Warm dry intact with no signs of infection or rash on extremities or on axial skeleton.  Abdomen: Soft nontender  Neuro: Cranial nerves II through XII are intact, neurovascularly intact in all extremities with 2+ DTRs and 2+ pulses.  Lymph: No lymphadenopathy of posterior or anterior cervical chain or axillae bilaterally.  Gait mild antalgic gait.  MSK:  Non tender with full range of motion and good stability and symmetric strength and tone of shoulders, elbows, wrist, hip, and ankles bilaterally.  Left knee exam still has some instability over the medial aspect of the knee.  Patient does have osteoarthritic changes.  Pain  over the medial joint line but better than before.  Mild crepitus.  Full range of motion.    Impression and Recommendations:     This case required medical decision making of moderate complexity.      Note: This dictation was prepared with Dragon dictation along with smaller phrase technology. Any transcriptional errors that result from this process are unintentional.

## 2017-11-12 ENCOUNTER — Ambulatory Visit: Payer: Self-pay | Admitting: Family Medicine

## 2017-11-13 ENCOUNTER — Ambulatory Visit: Payer: Self-pay | Admitting: Family Medicine

## 2017-11-21 DIAGNOSIS — F419 Anxiety disorder, unspecified: Secondary | ICD-10-CM | POA: Diagnosis not present

## 2017-12-05 NOTE — Progress Notes (Signed)
[name] [address] [place], [place] [address] Phone: [phone] Subjective:    I'm seeing this patient by the request  of:    CC: Left knee pain follow-up  [id number]  SELEN SMUCKER is a 62 y.o. female coming in with complaint of left knee pain. She states that she feels better and is not having pain in her knee since last visit. She had PRP last visit. She is back to playing tennis.  No degenerative changes.  Patient states very minimal pain overall.  Sometimes catches her but nothing severe.  Very happy with the results and using the brace regularly.     Past Medical History:  Diagnosis Date  . ADD (attention deficit disorder)    Past Surgical History:  Procedure Laterality Date  . COSMETIC SURGERY     Social History   Socioeconomic History  . Marital status: Married    Spouse name: None  . Number of children: None  . Years of education: None  . Highest education level: None  Social Needs  . Financial resource strain: None  . Food insecurity - worry: None  . Food insecurity - inability: None  . Transportation needs - medical: None  . Transportation needs - non-medical: None  Occupational History  . Occupation: Forensic scientist  Tobacco Use  . Smoking status: Never Smoker  . Smokeless tobacco: Never Used  Substance and Sexual Activity  . Alcohol use: No  . Drug use: No  . Sexual activity: Yes    Partners: Male  Other Topics Concern  . None  Social History Narrative  . None   No Known Allergies Family History  Problem Relation Age of Onset  . Bipolar disorder Son   . Diabetes Neg Hx   . Heart disease Neg Hx   . Hyperlipidemia Neg Hx   . Hypertension Neg Hx   . Stroke Neg Hx      Past medical history, social, surgical and family history all reviewed in electronic medical record.  No pertanent information unless stated regarding to the chief complaint.   Review of Systems:Review of systems updated and as accurate  as of 12/06/17  No headache, visual changes, nausea, vomiting, diarrhea, constipation, dizziness, abdominal pain, skin rash, fevers, chills, night sweats, weight loss, swollen lymph nodes, body aches, joint swelling, chest pain, shortness of breath, mood changes.  Positive muscle aches  Objective  Blood pressure 100/82, pulse 95, SpO2 98 %. Systems examined below as of 12/06/17   General: No apparent distress alert and oriented x3 mood and affect normal, dressed appropriately.  HEENT: Pupils equal, extraocular movements intact  Respiratory: Patient's speak in full sentences and does not appear short of breath  Cardiovascular: No lower extremity edema, non tender, no erythema  Skin: Warm dry intact with no signs of infection or rash on extremities or on axial skeleton.  Abdomen: Soft nontender  Neuro: Cranial nerves II through XII are intact, neurovascularly intact in all extremities with 2+ DTRs and 2+ pulses.  Lymph: No lymphadenopathy of posterior or anterior cervical chain or axillae bilaterally.  Gait normal with good balance and coordination.  MSK:  Non tender with full range of motion and good stability and symmetric strength and tone of shoulders, elbows, wrist, hip, and ankles bilaterally.  Knee: Left valgus deformity noted.  Tender to palpation over medial and PF joint line.  ROM full in flexion and extension and lower leg rotation. instability with valgus force.  Nonpainful patellar  compression. Patellar glide with mild crepitus. Patellar and quadriceps tendons unremarkable. Hamstring and quadriceps strength is normal. Contralateral knee shows minimal arthritic changes     Impression and Recommendations:     This case required medical decision making of moderate complexity.      Note: This dictation was prepared with Dragon dictation along with smaller phrase technology. Any transcriptional errors that result from this process are unintentional.

## 2017-12-06 ENCOUNTER — Other Ambulatory Visit: Payer: Self-pay

## 2017-12-06 ENCOUNTER — Encounter: Payer: Self-pay | Admitting: Family Medicine

## 2017-12-06 ENCOUNTER — Ambulatory Visit (INDEPENDENT_AMBULATORY_CARE_PROVIDER_SITE_OTHER): Payer: BLUE CROSS/BLUE SHIELD | Admitting: Family Medicine

## 2017-12-06 DIAGNOSIS — M1712 Unilateral primary osteoarthritis, left knee: Secondary | ICD-10-CM | POA: Diagnosis not present

## 2017-12-06 MED ORDER — VITAMIN D (ERGOCALCIFEROL) 1.25 MG (50000 UNIT) PO CAPS: 50000.0000 [IU] | ORAL_CAPSULE | ORAL | 0 refills | Status: DC

## 2017-12-06 NOTE — Patient Instructions (Signed)
Good to see you  I think you will do great  Continue the exercises and advance  Wear brace Have your therapist go after VMO muscle DHEA 50 mg daily for 4 weeks then 2 weeks off.  [place]- DO not do! Call us if you need us or use my chart

## 2017-12-06 NOTE — Assessment & Plan Note (Signed)
Degenerative left knee.  Discussed icing regimen and home exercises, discussed which activities to do which wants to avoid.  Patient is to increase activity slowly over the course of next several days.  Follow-up with me again 4-6 weeks

## 2017-12-15 DIAGNOSIS — F411 Generalized anxiety disorder: Secondary | ICD-10-CM | POA: Diagnosis not present

## 2017-12-15 DIAGNOSIS — F900 Attention-deficit hyperactivity disorder, predominantly inattentive type: Secondary | ICD-10-CM | POA: Diagnosis not present

## 2017-12-21 DIAGNOSIS — F419 Anxiety disorder, unspecified: Secondary | ICD-10-CM | POA: Diagnosis not present

## 2018-01-13 ENCOUNTER — Other Ambulatory Visit: Payer: Self-pay | Admitting: Family Medicine

## 2018-01-15 DIAGNOSIS — F419 Anxiety disorder, unspecified: Secondary | ICD-10-CM | POA: Diagnosis not present

## 2018-01-29 DIAGNOSIS — L812 Freckles: Secondary | ICD-10-CM | POA: Diagnosis not present

## 2018-01-29 DIAGNOSIS — L821 Other seborrheic keratosis: Secondary | ICD-10-CM | POA: Diagnosis not present

## 2018-01-29 DIAGNOSIS — D1801 Hemangioma of skin and subcutaneous tissue: Secondary | ICD-10-CM | POA: Diagnosis not present

## 2018-01-29 DIAGNOSIS — D225 Melanocytic nevi of trunk: Secondary | ICD-10-CM | POA: Diagnosis not present

## 2018-02-09 ENCOUNTER — Other Ambulatory Visit: Payer: Self-pay | Admitting: Family Medicine

## 2018-02-11 NOTE — Telephone Encounter (Signed)
Refill done.  

## 2018-02-19 DIAGNOSIS — F419 Anxiety disorder, unspecified: Secondary | ICD-10-CM | POA: Diagnosis not present

## 2018-03-15 DIAGNOSIS — F419 Anxiety disorder, unspecified: Secondary | ICD-10-CM | POA: Diagnosis not present

## 2018-04-05 DIAGNOSIS — F419 Anxiety disorder, unspecified: Secondary | ICD-10-CM | POA: Diagnosis not present

## 2018-04-25 ENCOUNTER — Encounter: Payer: Self-pay | Admitting: Family Medicine

## 2018-04-25 ENCOUNTER — Ambulatory Visit (INDEPENDENT_AMBULATORY_CARE_PROVIDER_SITE_OTHER): Payer: BLUE CROSS/BLUE SHIELD | Admitting: Family Medicine

## 2018-04-25 ENCOUNTER — Other Ambulatory Visit (HOSPITAL_COMMUNITY)
Admission: RE | Admit: 2018-04-25 | Discharge: 2018-04-25 | Disposition: A | Discharge status: Home / Self Care | Payer: BLUE CROSS/BLUE SHIELD | Source: Ambulatory Visit | Attending: Family Medicine | Admitting: Family Medicine

## 2018-04-25 VITALS — BP 112/53 | HR 66 | Temp 98.0°F | Resp 16 | Ht 67.0 in | Wt 143.2 lb

## 2018-04-25 DIAGNOSIS — Z124 Encounter for screening for malignant neoplasm of cervix: Secondary | ICD-10-CM | POA: Insufficient documentation

## 2018-04-25 DIAGNOSIS — Z1231 Encounter for screening mammogram for malignant neoplasm of breast: Secondary | ICD-10-CM

## 2018-04-25 DIAGNOSIS — E2839 Other primary ovarian failure: Secondary | ICD-10-CM

## 2018-04-25 DIAGNOSIS — Z1211 Encounter for screening for malignant neoplasm of colon: Secondary | ICD-10-CM | POA: Diagnosis not present

## 2018-04-25 DIAGNOSIS — Z0000 Encounter for general adult medical examination without abnormal findings: Secondary | ICD-10-CM | POA: Diagnosis not present

## 2018-04-25 DIAGNOSIS — Z1239 Encounter for other screening for malignant neoplasm of breast: Secondary | ICD-10-CM

## 2018-04-25 DIAGNOSIS — Z0184 Encounter for antibody response examination: Secondary | ICD-10-CM | POA: Diagnosis not present

## 2018-04-25 LAB — LIPID PANEL
CHOL/HDL RATIO: 2
Cholesterol: 228 mg/dL — ABNORMAL HIGH (ref 0–200)
HDL: 99.8 mg/dL (ref >39.00)
LDL CALC: 119 mg/dL — AB (ref 0–99)
NONHDL: 127.85
Triglycerides: 42 mg/dL (ref 0.0–149.0)
VLDL: 8.4 mg/dL (ref 0.0–40.0)

## 2018-04-25 LAB — POC URINALSYSI DIPSTICK (AUTOMATED)
BILIRUBIN UA: NEGATIVE
GLUCOSE UA: NEGATIVE
KETONES UA: NEGATIVE
Leukocytes, UA: NEGATIVE
Nitrite, UA: NEGATIVE
Protein, UA: NEGATIVE
RBC UA: NEGATIVE
Spec Grav, UA: 1.01 (ref 1.010–1.025)
Urobilinogen, UA: 0.2 E.U./dL
pH, UA: 6 (ref 5.0–8.0)

## 2018-04-25 LAB — COMPREHENSIVE METABOLIC PANEL
ALT: 22 U/L (ref 0–35)
AST: 22 U/L (ref 0–37)
Albumin: 4.4 g/dL (ref 3.5–5.2)
Alkaline Phosphatase: 99 U/L (ref 39–117)
BILIRUBIN TOTAL: 0.8 mg/dL (ref 0.2–1.2)
BUN: 15 mg/dL (ref 6–23)
CHLORIDE: 105 meq/L (ref 96–112)
CO2: 29 meq/L (ref 19–32)
Calcium: 9.4 mg/dL (ref 8.4–10.5)
Creatinine, Ser: 0.78 mg/dL (ref 0.40–1.20)
GFR: 79.77 mL/min (ref >60.00)
GLUCOSE: 84 mg/dL (ref 70–99)
Potassium: 4.4 mEq/L (ref 3.5–5.1)
Sodium: 141 mEq/L (ref 135–145)
Total Protein: 6.6 g/dL (ref 6.0–8.3)

## 2018-04-25 LAB — CBC WITH DIFFERENTIAL/PLATELET
BASOS ABS: 0 10*3/uL (ref 0.0–0.1)
Basophils Relative: 0.6 % (ref 0.0–3.0)
EOS ABS: 0.1 10*3/uL (ref 0.0–0.7)
Eosinophils Relative: 1.8 % (ref 0.0–5.0)
HCT: 41.6 % (ref 36.0–46.0)
Hemoglobin: 13.8 g/dL (ref 12.0–15.0)
LYMPHS ABS: 1.2 10*3/uL (ref 0.7–4.0)
Lymphocytes Relative: 25.5 % (ref 12.0–46.0)
MCHC: 33.2 g/dL (ref 30.0–36.0)
MCV: 95.8 fl (ref 78.0–100.0)
MONOS PCT: 8.7 % (ref 3.0–12.0)
Monocytes Absolute: 0.4 10*3/uL (ref 0.1–1.0)
NEUTROS ABS: 2.9 10*3/uL (ref 1.4–7.7)
NEUTROS PCT: 63.4 % (ref 43.0–77.0)
PLATELETS: 233 10*3/uL (ref 150.0–400.0)
RBC: 4.34 Mil/uL (ref 3.87–5.11)
RDW: 12.9 % (ref 11.5–15.5)
WBC: 4.5 10*3/uL (ref 4.0–10.5)

## 2018-04-25 LAB — TSH: TSH: 2.78 u[IU]/mL (ref 0.35–4.50)

## 2018-04-25 LAB — POC HEMOCCULT BLD/STL (OFFICE/1-CARD/DIAGNOSTIC)
Card #1 Date: 7112019
Fecal Occult Blood, POC: NEGATIVE

## 2018-04-25 NOTE — Progress Notes (Signed)
Subjective:     Jennifer Stokes is a 62 y.o. female and is here for a comprehensive physical exam. The patient reports no problems.  Social History   Socioeconomic History  . Marital status: Married    Spouse name: Not on file  . Number of children: Not on file  . Years of education: Not on file  . Highest education level: Not on file  Occupational History  . Occupation: Forensic scientist  Social Needs  . Financial resource strain: Not on file  . Food insecurity:    Worry: Not on file    Inability: Not on file  . Transportation needs:    Medical: Not on file    Non-medical: Not on file  Tobacco Use  . Smoking status: Never Smoker  . Smokeless tobacco: Never Used  Substance and Sexual Activity  . Alcohol use: No  . Drug use: No  . Sexual activity: Yes    Partners: Male  Lifestyle  . Physical activity:    Days per week: Not on file    Minutes per session: Not on file  . Stress: Not on file  Relationships  . Social connections:    Talks on phone: Not on file    Gets together: Not on file    Attends religious service: Not on file    Active member of club or organization: Not on file    Attends meetings of clubs or organizations: Not on file    Relationship status: Not on file  . Intimate partner violence:    Fear of current or ex partner: Not on file    Emotionally abused: Not on file    Physically abused: Not on file    Forced sexual activity: Not on file  Other Topics Concern  . Not on file  Social History Narrative  . Not on file   Health Maintenance  Topic Date Due  . PAP SMEAR  04/07/2018  . INFLUENZA VACCINE  05/16/2018  . MAMMOGRAM  08/07/2018  . TETANUS/TDAP  10/02/2021  . COLONOSCOPY  03/03/2024  . Hepatitis C Screening  Completed  . HIV Screening  Addressed    The following portions of the patient's history were reviewed and updated as appropriate:  She  has a past medical history of ADD (attention deficit disorder). She does not have any pertinent  problems on file. She  has a past surgical history that includes Cosmetic surgery. Her family history includes Bipolar disorder in her son. She  reports that she has never smoked. She has never used smokeless tobacco. She reports that she does not drink alcohol or use drugs. She has a current medication list which includes the following prescription(s): diclofenac sodium, methylphenidate, sertraline, and vitamin d (ergocalciferol). Current Outpatient Medications on File Prior to Visit  Medication Sig Dispense Refill  . Diclofenac Sodium (PENNSAID) 2 % SOLN Place 2 application onto the skin 2 (two) times daily. 112 g 3  . methylphenidate (RITALIN) 20 MG tablet Take 20 mg by mouth 4 (four) times daily.    . sertraline (ZOLOFT) 25 MG tablet TAKE 1 BY MOUTH DAILY 90 tablet 3  . Vitamin D, Ergocalciferol, (DRISDOL) 50000 units CAPS capsule Take 1 capsule (50,000 Units total) by mouth every 7 (seven) days. 12 capsule 0   No current facility-administered medications on file prior to visit.    She has No Known Allergies..  Review of Systems Review of Systems  Constitutional: Negative for activity change, appetite change and fatigue.  HENT:  Negative for hearing loss, congestion, tinnitus and ear discharge.  dentist q254m Eyes: Negative for visual disturbance (see optho q1y -- vision corrected to 20/20 with glasses).  Respiratory: Negative for cough, chest tightness and shortness of breath.   Cardiovascular: Negative for chest pain, palpitations and leg swelling.  Gastrointestinal: Negative for abdominal pain, diarrhea, constipation and abdominal distention.  Genitourinary: Negative for urgency, frequency, decreased urine volume and difficulty urinating.  Musculoskeletal: Negative for back pain, arthralgias and gait problem.  Skin: Negative for color change, pallor and rash.  Neurological: Negative for dizziness, light-headedness, numbness and headaches.  Hematological: Negative for adenopathy. Does  not bruise/bleed easily.  Psychiatric/Behavioral: Negative for suicidal ideas, confusion, sleep disturbance, self-injury, dysphoric mood, decreased concentration and agitation.       Objective:    BP (!) 112/53 (BP Location: Right Arm, Cuff Size: Normal)   Pulse 66   Temp 98 F (36.7 C) (Oral)   Resp 16   Ht 5\' 7"  (1.702 m)   Wt 143 lb 3.2 oz (65 kg)   SpO2 100%   BMI 22.43 kg/m  General appearance: alert, cooperative, appears stated age and no distress Head: Normocephalic, without obvious abnormality, atraumatic Eyes: negative findings: lids and lashes normal, conjunctivae and sclerae normal and pupils equal, round, reactive to light and accomodation Ears: normal TM's and external ear canals both ears Nose: Nares normal. Septum midline. Mucosa normal. No drainage or sinus tenderness. Throat: lips, mucosa, and tongue normal; teeth and gums normal Neck: no adenopathy, no carotid bruit, no JVD, supple, symmetrical, trachea midline and thyroid not enlarged, symmetric, no tenderness/mass/nodules Back: symmetric, no curvature. ROM normal. No CVA tenderness. Lungs: clear to auscultation bilaterally Breasts: normal appearance, no masses or tenderness Heart: regular rate and rhythm, S1, S2 normal, no murmur, click, rub or gallop Abdomen: soft, non-tender; bowel sounds normal; no masses,  no organomegaly Pelvic: cervix normal in appearance, external genitalia normal, no adnexal masses or tenderness, no cervical motion tenderness, rectovaginal septum normal, uterus normal size, shape, and consistency, vagina normal without discharge and pap done, rectal heme neg brown stool Extremities: extremities normal, atraumatic, no cyanosis or edema Pulses: 2+ and symmetric Skin: Skin color, texture, turgor normal. No rashes or lesions Lymph nodes: Cervical, supraclavicular, and axillary nodes normal. Neurologic: Grossly normal    Assessment:    Healthy female exam.      Plan:    ghm utd   Check labs See After Visit Summary for Counseling Recommendations

## 2018-04-25 NOTE — Patient Instructions (Signed)
Preventive Care 40-64 Years, Female Preventive care refers to lifestyle choices and visits with your health care provider that can promote health and wellness. What does preventive care include?  A yearly physical exam. This is also called an annual well check.  Dental exams once or twice a year.  Routine eye exams. Ask your health care provider how often you should have your eyes checked.  Personal lifestyle choices, including: ? Daily care of your teeth and gums. ? Regular physical activity. ? Eating a healthy diet. ? Avoiding tobacco and drug use. ? Limiting alcohol use. ? Practicing safe sex. ? Taking low-dose aspirin daily starting at age 58. ? Taking vitamin and mineral supplements as recommended by your health care provider. What happens during an annual well check? The services and screenings done by your health care provider during your annual well check will depend on your age, overall health, lifestyle risk factors, and family history of disease. Counseling Your health care provider may ask you questions about your:  Alcohol use.  Tobacco use.  Drug use.  Emotional well-being.  Home and relationship well-being.  Sexual activity.  Eating habits.  Work and work Statistician.  Method of birth control.  Menstrual cycle.  Pregnancy history.  Screening You may have the following tests or measurements:  Height, weight, and BMI.  Blood pressure.  Lipid and cholesterol levels. These may be checked every 5 years, or more frequently if you are over 81 years old.  Skin check.  Lung cancer screening. You may have this screening every year starting at age 78 if you have a 30-pack-year history of smoking and currently smoke or have quit within the past 15 years.  Fecal occult blood test (FOBT) of the stool. You may have this test every year starting at age 65.  Flexible sigmoidoscopy or colonoscopy. You may have a sigmoidoscopy every 5 years or a colonoscopy  every 10 years starting at age 30.  Hepatitis C blood test.  Hepatitis B blood test.  Sexually transmitted disease (STD) testing.  Diabetes screening. This is done by checking your blood sugar (glucose) after you have not eaten for a while (fasting). You may have this done every 1-3 years.  Mammogram. This may be done every 1-2 years. Talk to your health care provider about when you should start having regular mammograms. This may depend on whether you have a family history of breast cancer.  BRCA-related cancer screening. This may be done if you have a family history of breast, ovarian, tubal, or peritoneal cancers.  Pelvic exam and Pap test. This may be done every 3 years starting at age 80. Starting at age 36, this may be done every 5 years if you have a Pap test in combination with an HPV test.  Bone density scan. This is done to screen for osteoporosis. You may have this scan if you are at high risk for osteoporosis.  Discuss your test results, treatment options, and if necessary, the need for more tests with your health care provider. Vaccines Your health care provider may recommend certain vaccines, such as:  Influenza vaccine. This is recommended every year.  Tetanus, diphtheria, and acellular pertussis (Tdap, Td) vaccine. You may need a Td booster every 10 years.  Varicella vaccine. You may need this if you have not been vaccinated.  Zoster vaccine. You may need this after age 5.  Measles, mumps, and rubella (MMR) vaccine. You may need at least one dose of MMR if you were born in  1956 or later. You may also need a second dose.  Pneumococcal 13-valent conjugate (PCV13) vaccine. You may need this if you have certain conditions and were not previously vaccinated.  Pneumococcal polysaccharide (PPSV23) vaccine. You may need one or two doses if you smoke cigarettes or if you have certain conditions.  Meningococcal vaccine. You may need this if you have certain  conditions.  Hepatitis A vaccine. You may need this if you have certain conditions or if you travel or work in places where you may be exposed to hepatitis A.  Hepatitis B vaccine. You may need this if you have certain conditions or if you travel or work in places where you may be exposed to hepatitis B.  Haemophilus influenzae type b (Hib) vaccine. You may need this if you have certain conditions.  Talk to your health care provider about which screenings and vaccines you need and how often you need them. This information is not intended to replace advice given to you by your health care provider. Make sure you discuss any questions you have with your health care provider. Document Released: 10/29/2015 Document Revised: 06/21/2016 Document Reviewed: 08/03/2015 Elsevier Interactive Patient Education  2018 Elsevier Inc.  

## 2018-04-26 ENCOUNTER — Telehealth: Payer: Self-pay

## 2018-04-26 LAB — CYTOLOGY - PAP
DIAGNOSIS: NEGATIVE
HPV (WINDOPATH): NOT DETECTED

## 2018-04-26 LAB — MEASLES/MUMPS/RUBELLA IMMUNITY
Mumps IgG: >300 AU/mL
Rubella: 6.92 index
Rubeola IgG: 33.6 AU/mL

## 2018-04-26 NOTE — Telephone Encounter (Signed)
Author phoned pt. To relay lab results and to recommend MMR booster per Dr. Etter Sjogren. No answer, author left VM to return call [phone].

## 2018-04-26 NOTE — Telephone Encounter (Signed)
-----  Message from Chayse Held, [place] sent at 04/26/2018  1:13 PM EDT ----- Equivocal-- can have MMR booster with nurse visit

## 2018-04-30 DIAGNOSIS — F419 Anxiety disorder, unspecified: Secondary | ICD-10-CM | POA: Diagnosis not present

## 2018-05-08 ENCOUNTER — Ambulatory Visit (INDEPENDENT_AMBULATORY_CARE_PROVIDER_SITE_OTHER): Payer: BLUE CROSS/BLUE SHIELD

## 2018-05-08 DIAGNOSIS — Z23 Encounter for immunization: Secondary | ICD-10-CM

## 2018-05-27 DIAGNOSIS — F900 Attention-deficit hyperactivity disorder, predominantly inattentive type: Secondary | ICD-10-CM | POA: Diagnosis not present

## 2018-05-27 DIAGNOSIS — F411 Generalized anxiety disorder: Secondary | ICD-10-CM | POA: Diagnosis not present

## 2018-05-28 DIAGNOSIS — F419 Anxiety disorder, unspecified: Secondary | ICD-10-CM | POA: Diagnosis not present

## 2018-06-03 NOTE — Progress Notes (Signed)
Tawana ScaleZach Smith D.O. [facility] [address] [place], [place] [address] Phone: [phone] Subjective:     CC: Right arm pain  BJY:[id number]:Subjective  Jennifer Stokes is a 62 y.o. female coming in with complaint of upper arm pain. She was lifting her dog and plays tennis so is unsure of mechanism of injury. Is unable to provide pain character. She just states that she has pain. Pain with tennis serving. Has tried IBU and pennsaid.     Past Medical History:  Diagnosis Date  . ADD (attention deficit disorder)    Past Surgical History:  Procedure Laterality Date  . COSMETIC SURGERY     Social History   Socioeconomic History  . Marital status: Married    Spouse name: Not on file  . Number of children: Not on file  . Years of education: Not on file  . Highest education level: Not on file  Occupational History  . Occupation: Forensic scientistfinancial planner  Social Needs  . Financial resource strain: Not on file  . Food insecurity:    Worry: Not on file    Inability: Not on file  . Transportation needs:    Medical: Not on file    Non-medical: Not on file  Tobacco Use  . Smoking status: Never Smoker  . Smokeless tobacco: Never Used  Substance and Sexual Activity  . Alcohol use: No  . Drug use: No  . Sexual activity: Yes    Partners: Male  Lifestyle  . Physical activity:    Days per week: Not on file    Minutes per session: Not on file  . Stress: Not on file  Relationships  . Social connections:    Talks on phone: Not on file    Gets together: Not on file    Attends religious service: Not on file    Active member of club or organization: Not on file    Attends meetings of clubs or organizations: Not on file    Relationship status: Not on file  Other Topics Concern  . Not on file  Social History Narrative  . Not on file   No Known Allergies Family History  Problem Relation Age of Onset  . Bipolar disorder Son   . Diabetes Neg Hx   . Heart disease Neg Hx   . Hyperlipidemia  Neg Hx   . Hypertension Neg Hx   . Stroke Neg Hx      Past medical history, social, surgical and family history all reviewed in electronic medical record.  No pertanent information unless stated regarding to the chief complaint.   Review of Systems:Review of systems updated and as accurate as of 06/04/18  No headache, visual changes, nausea, vomiting, diarrhea, constipation, dizziness, abdominal pain, skin rash, fevers, chills, night sweats, weight loss, swollen lymph nodes, body aches, joint swelling, muscle aches, chest pain, shortness of breath, mood changes.   Objective  Blood pressure 108/80, pulse 93, height 5\' 7"  (1.702 m), weight 142 lb (64.4 kg), SpO2 97 %. Systems examined below as of 06/04/18   General: No apparent distress alert and oriented x3 mood and affect normal, dressed appropriately.  HEENT: Pupils equal, extraocular movements intact  Respiratory: Patient's speak in full sentences and does not appear short of breath  Cardiovascular: No lower extremity edema, non tender, no erythema  Skin: Warm dry intact with no signs of infection or rash on extremities or on axial skeleton.  Abdomen: Soft nontender  Neuro: Cranial nerves II through XII are  intact, neurovascularly intact in all extremities with 2+ DTRs and 2+ pulses.  Lymph: No lymphadenopathy of posterior or anterior cervical chain or axillae bilaterally.  Gait normal with good balance and coordination.  MSK:  Non tender with full range of motion and good stability and symmetric strength and tone of  elbows, wrist, hip, knee and ankles bilaterally.  Shoulder: Right Inspection reveals no abnormalities, atrophy or asymmetry. Palpation is normal with no tenderness over AC joint or bicipital groove.  Patient does have pain more just distal to the insertion of the deltoid on the lateral humerus ROM is full in all planes. Rotator cuff strength normal throughout. No signs of impingement with negative Neer and Hawkin's  tests, empty can sign. Speeds and Yergason's tests normal. No labral pathology noted with negative Obrien's, negative clunk and good stability. Normal scapular function observed. No painful arc and no drop arm sign. No apprehension sign Contralateral shoulder unremarkable  MSK US performed of: Right shoulder This study was ordered, performed, and interpreted by Terrilee FilesZach Smith D.O.  Shoulder:   Supraspinatus:  Appears normal on long and transverse views, no bursal bulge seen with shoulder abduction on impingement view. Infraspinatus:  Appears normal on long and transverse views. Subscapularis:  Appears normal on long and transverse views. AC joint: Mild arthritis Biceps Tendon:  Appears normal on long and transverse views, no fraying of tendon, tendon located in intertubercular groove, no subluxation with shoulder internal or external rotation. No increased power doppler signal. Some very mild increase in Doppler flow at the area patient is having pain.  Approximately 1 cm distal to the insertion of the deltoid.  Questionable soft tissue irritation noted that could be more consistent with a contusion      Impression and Recommendations:     This case required medical decision making of moderate complexity.      Note: This dictation was prepared with Dragon dictation along with smaller phrase technology. Any transcriptional errors that result from this process are unintentional.

## 2018-06-04 ENCOUNTER — Ambulatory Visit (INDEPENDENT_AMBULATORY_CARE_PROVIDER_SITE_OTHER): Payer: BLUE CROSS/BLUE SHIELD | Admitting: Family Medicine

## 2018-06-04 ENCOUNTER — Encounter: Payer: Self-pay | Admitting: Family Medicine

## 2018-06-04 ENCOUNTER — Ambulatory Visit: Payer: Self-pay

## 2018-06-04 VITALS — BP 108/80 | HR 93 | Ht 67.0 in | Wt 142.0 lb

## 2018-06-04 DIAGNOSIS — M79601 Pain in right arm: Secondary | ICD-10-CM

## 2018-06-04 DIAGNOSIS — S40029A Contusion of unspecified upper arm, initial encounter: Secondary | ICD-10-CM | POA: Insufficient documentation

## 2018-06-04 DIAGNOSIS — S40021A Contusion of right upper arm, initial encounter: Secondary | ICD-10-CM | POA: Diagnosis not present

## 2018-06-04 MED ORDER — GABAPENTIN 100 MG PO CAPS: 200.0000 mg | ORAL_CAPSULE | Freq: Every day | ORAL | 3 refills | Status: DC

## 2018-06-04 NOTE — Patient Instructions (Signed)
Good to see you  Ice Is your friend.  Arnica lotion 2 times a day could help  pennsaid pinkie amount topically 2 times daily as needed.  Compression in the ara could be good Stop massaging it Continue the vitamin D Gabapentin 200mg  at night See me  Again in 4 weeks

## 2018-06-04 NOTE — Assessment & Plan Note (Signed)
Patient has what appears to be more of a right upper arm contusion.  I do not see anything else significant.  Shoulder seems to be unremarkable.  Discussed the mild increase in Doppler flow.  Discussed compression, home exercises.  Differential includes more of a cervical radiculopathy.  Follow-up again in 4 to 6 weeks

## 2018-06-26 ENCOUNTER — Other Ambulatory Visit: Payer: Self-pay | Admitting: Family Medicine

## 2018-06-26 NOTE — Telephone Encounter (Signed)
Refill done.  

## 2018-06-27 DIAGNOSIS — H5213 Myopia, bilateral: Secondary | ICD-10-CM | POA: Diagnosis not present

## 2018-06-27 DIAGNOSIS — H2513 Age-related nuclear cataract, bilateral: Secondary | ICD-10-CM | POA: Diagnosis not present

## 2018-07-04 ENCOUNTER — Ambulatory Visit: Payer: BLUE CROSS/BLUE SHIELD

## 2018-07-04 ENCOUNTER — Other Ambulatory Visit: Payer: BLUE CROSS/BLUE SHIELD

## 2018-07-05 DIAGNOSIS — F419 Anxiety disorder, unspecified: Secondary | ICD-10-CM | POA: Diagnosis not present

## 2018-07-09 ENCOUNTER — Encounter: Payer: Self-pay | Admitting: Family Medicine

## 2018-07-09 ENCOUNTER — Ambulatory Visit (INDEPENDENT_AMBULATORY_CARE_PROVIDER_SITE_OTHER): Payer: BLUE CROSS/BLUE SHIELD | Admitting: Family Medicine

## 2018-07-09 ENCOUNTER — Other Ambulatory Visit: Payer: Self-pay

## 2018-07-09 VITALS — BP 100/70 | HR 84 | Ht 67.0 in | Wt 142.0 lb

## 2018-07-09 DIAGNOSIS — M79671 Pain in right foot: Secondary | ICD-10-CM

## 2018-07-09 DIAGNOSIS — Z23 Encounter for immunization: Secondary | ICD-10-CM | POA: Diagnosis not present

## 2018-07-09 DIAGNOSIS — M79673 Pain in unspecified foot: Secondary | ICD-10-CM | POA: Insufficient documentation

## 2018-07-09 MED ORDER — GABAPENTIN 100 MG PO CAPS: 200.0000 mg | ORAL_CAPSULE | Freq: Every day | ORAL | 2 refills | Status: DC

## 2018-07-09 MED ORDER — DICLOFENAC SODIUM 2 % TD SOLN: 2.0000 g | Freq: Two times a day (BID) | TRANSDERMAL | 3 refills | Status: DC

## 2018-07-09 NOTE — Assessment & Plan Note (Signed)
Likely contusion, discussed different shoes, discussed heel lift, follow-up again in 6 weeks if not completely resolved.

## 2018-07-09 NOTE — Patient Instructions (Signed)
Good to see you  Overall not bad Can play with the gabapentin from 0-3 pills at night Watch the neck and may need to consider another injection or I can give you prednisone Keep hands within peripheral vision  Ice is your friend.  Didora could be good for you  If ot better in 4 weeks see me again

## 2018-07-09 NOTE — Progress Notes (Signed)
Jennifer Stokes D.O. [facility] [address] [place], [place] [address] Phone: [phone] Subjective:   I, Jennifer NighKana Stokes, am serving as a scribe for Dr. Antoine PrimasZachary [place] Stokes.  I'm seeing this patient by the request  of:    CC: Right arm follow-up, right heel  KGM:[id number]:Subjective  Jennifer Stokes B Foot is a 62 y.o. female coming in with complaint of right arm and right heel pain. Arm is doing better. Not at 100%. Heel pain is what she thought was a tight muscle. Wants to look under ultrasound. Wants more pennsaid.   Onset- Chronic Location- Heel Character-aching sensation worse with activity Aggravating factors- Stairs in the morning, walking seems to be worse with tennis Reliving factors-rest Therapies tried-nothing specifically Severity-4 out of 10 but worsening     Past Medical History:  Diagnosis Date  . ADD (attention deficit disorder)    Past Surgical History:  Procedure Laterality Date  . COSMETIC SURGERY     Social History   Socioeconomic History  . Marital status: Married    Spouse name: Not on file  . Number of children: Not on file  . Years of education: Not on file  . Highest education level: Not on file  Occupational History  . Occupation: Forensic scientistfinancial planner  Social Needs  . Financial resource strain: Not on file  . Food insecurity:    Worry: Not on file    Inability: Not on file  . Transportation needs:    Medical: Not on file    Non-medical: Not on file  Tobacco Use  . Smoking status: Never Smoker  . Smokeless tobacco: Never Used  Substance and Sexual Activity  . Alcohol use: No  . Drug use: No  . Sexual activity: Yes    Partners: Male  Lifestyle  . Physical activity:    Days per week: Not on file    Minutes per session: Not on file  . Stress: Not on file  Relationships  . Social connections:    Talks on phone: Not on file    Gets together: Not on file    Attends religious service: Not on file    Active member of club or organization: Not on file      Attends meetings of clubs or organizations: Not on file    Relationship status: Not on file  Other Topics Concern  . Not on file  Social History Narrative  . Not on file   No Known Allergies Family History  Problem Relation Age of Onset  . Bipolar disorder Son   . Diabetes Neg Hx   . Heart disease Neg Hx   . Hyperlipidemia Neg Hx   . Hypertension Neg Hx   . Stroke Neg Hx          Current Outpatient Medications (Other):  Marland Kitchen.  Diclofenac Sodium (PENNSAID) 2 % SOLN, Place 2 application onto the skin 2 (two) times daily. .  methylphenidate (RITALIN) 20 MG tablet, Take 20 mg by mouth 4 (four) times daily. .  sertraline (ZOLOFT) 25 MG tablet, TAKE 1 BY MOUTH DAILY .  Vitamin D, Ergocalciferol, (DRISDOL) 50000 units CAPS capsule, Take 1 capsule (50,000 Units total) by mouth every 7 (seven) days. .  Diclofenac Sodium (PENNSAID) 2 % SOLN, Place 2 g onto the skin 2 (two) times daily. Marland Kitchen.  gabapentin (NEURONTIN) 100 MG capsule, Take 2 capsules (200 mg total) by mouth at bedtime.    Past medical history, social, surgical and family history all reviewed in electronic medical  record.  No pertanent information unless stated regarding to the chief complaint.   Review of Systems:  No headache, visual changes, nausea, vomiting, diarrhea, constipation, dizziness, abdominal pain, skin rash, fevers, chills, night sweats, weight loss, swollen lymph nodes, body aches, joint swelling, muscle aches, chest pain, shortness of breath, mood changes.   Objective  Blood pressure 100/70, pulse 84, height 5\' 7"  (1.702 m), weight 142 lb (64.4 kg), SpO2 97 %.    General: No apparent distress alert and oriented x3 mood and affect normal, dressed appropriately.  HEENT: Pupils equal, extraocular movements intact  Respiratory: Patient's speak in full sentences and does not appear short of breath  Cardiovascular: No lower extremity edema, non tender, no erythema  Skin: Warm dry intact with no signs of  infection or rash on extremities or on axial skeleton.  Abdomen: Soft nontender  Neuro: Cranial nerves II through XII are intact, neurovascularly intact in all extremities with 2+ DTRs and 2+ pulses.  Lymph: No lymphadenopathy of posterior or anterior cervical chain or axillae bilaterally.  Gait mild antalgic significant overpronation of the foot.  Minimal tenderness on the plantar aspect.  Tightness of the plantar fascia noted.  Neuro fluid noted. MSK:  Non tender with full range of motion and good stability and symmetric strength and tone of shoulders, elbows, wrist, hip, and ankles bilaterally.  Arthritic changes of the knee noted  Limited musculoskeletal ultrasound was performed and interpreted by Judi Saa  Ultrasound of patient's heel shows mild soft tissue swelling but otherwise unremarkable   Impression and Recommendations:     This case required medical decision making of moderate complexity. The above documentation has been reviewed and is accurate and complete Judi Saa, DO       Note: This dictation was prepared with Dragon dictation along with smaller phrase technology. Any transcriptional errors that result from this process are unintentional.

## 2018-07-23 ENCOUNTER — Ambulatory Visit
Admission: RE | Admit: 2018-07-23 | Discharge: 2018-07-23 | Disposition: A | Discharge status: Home / Self Care | Payer: BLUE CROSS/BLUE SHIELD | Source: Ambulatory Visit | Attending: Family Medicine | Admitting: Family Medicine

## 2018-07-23 DIAGNOSIS — E2839 Other primary ovarian failure: Secondary | ICD-10-CM

## 2018-07-23 DIAGNOSIS — Z1239 Encounter for other screening for malignant neoplasm of breast: Secondary | ICD-10-CM

## 2018-07-23 DIAGNOSIS — Z780 Asymptomatic menopausal state: Secondary | ICD-10-CM | POA: Diagnosis not present

## 2018-07-23 DIAGNOSIS — Z1231 Encounter for screening mammogram for malignant neoplasm of breast: Secondary | ICD-10-CM | POA: Diagnosis not present

## 2018-07-23 DIAGNOSIS — Z13820 Encounter for screening for osteoporosis: Secondary | ICD-10-CM | POA: Diagnosis not present

## 2018-08-06 DIAGNOSIS — M542 Cervicalgia: Secondary | ICD-10-CM | POA: Diagnosis not present

## 2018-08-06 DIAGNOSIS — M5000 Cervical disc disorder with myelopathy, unspecified cervical region: Secondary | ICD-10-CM | POA: Diagnosis not present

## 2018-08-06 DIAGNOSIS — M5412 Radiculopathy, cervical region: Secondary | ICD-10-CM | POA: Diagnosis not present

## 2018-08-11 NOTE — Progress Notes (Signed)
[name] [address] [place], [place] [address] Phone: [phone] Subjective:    I Jennifer Stokes am serving as a Neurosurgeon for Dr. Antoine Primas.   I'm seeing this patient by the request  of:    CC: Heel pain follow-up  BJY:[id number]  Jennifer Stokes is a 62 y.o. female coming in with complaint of right heel pain. Heel is doing better.  States that the change in shoe has made no significant improvement.  Patient denies any significant pain at all.  Avoiding being barefoot and thinks that that has helped.     Past Medical History:  Diagnosis Date  . ADD (attention deficit disorder)    Past Surgical History:  Procedure Laterality Date  . COSMETIC SURGERY     Social History   Socioeconomic History  . Marital status: Married    Spouse name: Not on file  . Number of children: Not on file  . Years of education: Not on file  . Highest education level: Not on file  Occupational History  . Occupation: Forensic scientist  Social Needs  . Financial resource strain: Not on file  . Food insecurity:    Worry: Not on file    Inability: Not on file  . Transportation needs:    Medical: Not on file    Non-medical: Not on file  Tobacco Use  . Smoking status: Never Smoker  . Smokeless tobacco: Never Used  Substance and Sexual Activity  . Alcohol use: No  . Drug use: No  . Sexual activity: Yes    Partners: Male  Lifestyle  . Physical activity:    Days per week: Not on file    Minutes per session: Not on file  . Stress: Not on file  Relationships  . Social connections:    Talks on phone: Not on file    Gets together: Not on file    Attends religious service: Not on file    Active member of club or organization: Not on file    Attends meetings of clubs or organizations: Not on file    Relationship status: Not on file  Other Topics Concern  . Not on file  Social History Narrative  . Not on file   No Known Allergies Family History  Problem  Relation Age of Onset  . Bipolar disorder Son   . Diabetes Neg Hx   . Heart disease Neg Hx   . Hyperlipidemia Neg Hx   . Hypertension Neg Hx   . Stroke Neg Hx          Current Outpatient Medications (Other):  Marland Kitchen  Diclofenac Sodium (PENNSAID) 2 % SOLN, Place 2 application onto the skin 2 (two) times daily. .  Diclofenac Sodium (PENNSAID) 2 % SOLN, Place 2 g onto the skin 2 (two) times daily. Marland Kitchen  gabapentin (NEURONTIN) 100 MG capsule, Take 2 capsules (200 mg total) by mouth at bedtime. .  methylphenidate (RITALIN) 20 MG tablet, Take 20 mg by mouth 4 (four) times daily. .  sertraline (ZOLOFT) 25 MG tablet, TAKE 1 BY MOUTH DAILY .  Vitamin D, Ergocalciferol, (DRISDOL) 50000 units CAPS capsule, Take 1 capsule (50,000 Units total) by mouth every 7 (seven) days.    Past medical history, social, surgical and family history all reviewed in electronic medical record.  No pertanent information unless stated regarding to the chief complaint.   Review of Systems:  No headache, visual changes, nausea, vomiting, diarrhea, constipation, dizziness, abdominal pain, skin  rash, fevers, chills, night sweats, weight loss, swollen lymph nodes, body aches, joint swelling, muscle aches, chest pain, shortness of breath, mood changes.   Objective  There were no vitals taken for this visit. Systems examined below as of    General: No apparent distress alert and oriented x3 mood and affect normal, dressed appropriately.  HEENT: Pupils equal, extraocular movements intact  Respiratory: Patient's speak in full sentences and does not appear short of breath  Cardiovascular: No lower extremity edema, non tender, no erythema  Skin: Warm dry intact with no signs of infection or rash on extremities or on axial skeleton.  Abdomen: Soft nontender  Neuro: Cranial nerves II through XII are intact, neurovascularly intact in all extremities with 2+ DTRs and 2+ pulses.  Lymph: No lymphadenopathy of posterior or anterior  cervical chain or axillae bilaterally.  Gait normal with good balance and coordination.  MSK:  Non tender with full range of motion and good stability and symmetric strength and tone of shoulders, elbows, wrist, hip, knee bilaterally.  Ankle: Right No visible erythema or swelling. Range of motion is full in all directions. Strength is 5/5 in all directions. Stable lateral and medial ligaments; squeeze test and kleiger test unremarkable; Talar dome nontender; No pain at base of 5th MT; No tenderness over cuboid; No tenderness over N spot or navicular prominence No tenderness on posterior aspects of lateral and medial malleolus No sign of peroneal tendon subluxations or tenderness to palpation Negative tarsal tunnel tinel's Able to walk 4 steps.  Foot exam shows the patient has some narrow foot.  Mild breakdown of the longitudinal and transverse arch.  Mild overpronation of the hindfoot   Impression and Recommendations:     This case required medical decision making of moderate complexity. The above documentation has been reviewed and is accurate and complete Judi Saa, DO       Note: This dictation was prepared with Dragon dictation along with smaller phrase technology. Any transcriptional errors that result from this process are unintentional.

## 2018-08-13 ENCOUNTER — Ambulatory Visit (INDEPENDENT_AMBULATORY_CARE_PROVIDER_SITE_OTHER): Payer: BLUE CROSS/BLUE SHIELD | Admitting: Family Medicine

## 2018-08-13 ENCOUNTER — Encounter: Payer: Self-pay | Admitting: Family Medicine

## 2018-08-13 DIAGNOSIS — M79671 Pain in right foot: Secondary | ICD-10-CM | POA: Diagnosis not present

## 2018-08-13 NOTE — Patient Instructions (Addendum)
Good to see you  Go get the injection in the neck.  Tell him better while on prednsone but nothing like when you did the injection 2 years ago.  Ice is your friend Do not decrease gabapentin until you have the injeciton  See me again in 6 weeks if not perfect but looking good overall and the heel should do fine

## 2018-08-13 NOTE — Assessment & Plan Note (Signed)
Overall doing well.  I do believe more of a contusion of the heel pain.  Is doing well with the gabapentin.  Continue the vitamin D and the topical anti-inflammatories as needed.  Follow-up as needed

## 2018-08-14 DIAGNOSIS — F419 Anxiety disorder, unspecified: Secondary | ICD-10-CM | POA: Diagnosis not present

## 2018-09-05 DIAGNOSIS — M5000 Cervical disc disorder with myelopathy, unspecified cervical region: Secondary | ICD-10-CM | POA: Diagnosis not present

## 2018-09-05 DIAGNOSIS — M5412 Radiculopathy, cervical region: Secondary | ICD-10-CM | POA: Diagnosis not present

## 2018-09-10 DIAGNOSIS — F419 Anxiety disorder, unspecified: Secondary | ICD-10-CM | POA: Diagnosis not present

## 2018-09-26 DIAGNOSIS — M542 Cervicalgia: Secondary | ICD-10-CM | POA: Diagnosis not present

## 2018-09-26 DIAGNOSIS — M5412 Radiculopathy, cervical region: Secondary | ICD-10-CM | POA: Diagnosis not present

## 2018-09-26 DIAGNOSIS — M5000 Cervical disc disorder with myelopathy, unspecified cervical region: Secondary | ICD-10-CM | POA: Diagnosis not present

## 2018-10-02 DIAGNOSIS — F419 Anxiety disorder, unspecified: Secondary | ICD-10-CM | POA: Diagnosis not present

## 2018-11-08 DIAGNOSIS — F419 Anxiety disorder, unspecified: Secondary | ICD-10-CM | POA: Diagnosis not present

## 2018-11-29 DIAGNOSIS — F419 Anxiety disorder, unspecified: Secondary | ICD-10-CM | POA: Diagnosis not present

## 2018-12-07 DIAGNOSIS — F411 Generalized anxiety disorder: Secondary | ICD-10-CM | POA: Diagnosis not present

## 2018-12-07 DIAGNOSIS — F900 Attention-deficit hyperactivity disorder, predominantly inattentive type: Secondary | ICD-10-CM | POA: Diagnosis not present

## 2018-12-26 DIAGNOSIS — F419 Anxiety disorder, unspecified: Secondary | ICD-10-CM | POA: Diagnosis not present

## 2019-01-24 DIAGNOSIS — F419 Anxiety disorder, unspecified: Secondary | ICD-10-CM | POA: Diagnosis not present

## 2019-02-14 DIAGNOSIS — F419 Anxiety disorder, unspecified: Secondary | ICD-10-CM | POA: Diagnosis not present

## 2019-02-17 DIAGNOSIS — M25562 Pain in left knee: Secondary | ICD-10-CM | POA: Diagnosis not present

## 2019-02-17 DIAGNOSIS — M25551 Pain in right hip: Secondary | ICD-10-CM | POA: Diagnosis not present

## 2019-02-18 ENCOUNTER — Ambulatory Visit: Payer: BLUE CROSS/BLUE SHIELD | Admitting: Family Medicine

## 2019-02-18 NOTE — Progress Notes (Deleted)
[name] [address] [place], [place] [address] Phone: [phone] Subjective:    I'm seeing this patient by the request  of:    CC:   FXO:VANVBTYOMA  Jennifer Stokes is a 63 y.o. female coming in with complaint of ***  Onset-  Location Duration-  Character- Aggravating factors- Reliving factors-  Therapies tried-  Severity-     Past Medical History:  Diagnosis Date  . ADD (attention deficit disorder)    Past Surgical History:  Procedure Laterality Date  . COSMETIC SURGERY     Social History   Socioeconomic History  . Marital status: Married    Spouse name: Not on file  . Number of children: Not on file  . Years of education: Not on file  . Highest education level: Not on file  Occupational History  . Occupation: Forensic scientist  Social Needs  . Financial resource strain: Not on file  . Food insecurity:    Worry: Not on file    Inability: Not on file  . Transportation needs:    Medical: Not on file    Non-medical: Not on file  Tobacco Use  . Smoking status: Never Smoker  . Smokeless tobacco: Never Used  Substance and Sexual Activity  . Alcohol use: No  . Drug use: No  . Sexual activity: Yes    Partners: Male  Lifestyle  . Physical activity:    Days per week: Not on file    Minutes per session: Not on file  . Stress: Not on file  Relationships  . Social connections:    Talks on phone: Not on file    Gets together: Not on file    Attends religious service: Not on file    Active member of club or organization: Not on file    Attends meetings of clubs or organizations: Not on file    Relationship status: Not on file  Other Topics Concern  . Not on file  Social History Narrative  . Not on file   No Known Allergies Family History  Problem Relation Age of Onset  . Bipolar disorder Son   . Diabetes Neg Hx   . Heart disease Neg Hx   . Hyperlipidemia Neg Hx   . Hypertension Neg Hx   . Stroke Neg Hx          Current Outpatient Medications (Other):  Marland Kitchen  Diclofenac Sodium (PENNSAID) 2 % SOLN, Place 2 application onto the skin 2 (two) times daily. .  Diclofenac Sodium (PENNSAID) 2 % SOLN, Place 2 g onto the skin 2 (two) times daily. Marland Kitchen  gabapentin (NEURONTIN) 100 MG capsule, Take 2 capsules (200 mg total) by mouth at bedtime. .  methylphenidate (RITALIN) 20 MG tablet, Take 20 mg by mouth 4 (four) times daily. .  sertraline (ZOLOFT) 25 MG tablet, TAKE 1 BY MOUTH DAILY .  Vitamin D, Ergocalciferol, (DRISDOL) 50000 units CAPS capsule, Take 1 capsule (50,000 Units total) by mouth every 7 (seven) days.    Past medical history, social, surgical and family history all reviewed in electronic medical record.  No pertanent information unless stated regarding to the chief complaint.   Review of Systems:  No headache, visual changes, nausea, vomiting, diarrhea, constipation, dizziness, abdominal pain, skin rash, fevers, chills, night sweats, weight loss, swollen lymph nodes, body aches, joint swelling, muscle aches, chest pain, shortness of breath, mood changes.   Objective  There were no vitals taken for this visit. Systems examined  below as of    General: No apparent distress alert and oriented x3 mood and affect normal, dressed appropriately.  HEENT: Pupils equal, extraocular movements intact  Respiratory: Patient's speak in full sentences and does not appear short of breath  Cardiovascular: No lower extremity edema, non tender, no erythema  Skin: Warm dry intact with no signs of infection or rash on extremities or on axial skeleton.  Abdomen: Soft nontender  Neuro: Cranial nerves II through XII are intact, neurovascularly intact in all extremities with 2+ DTRs and 2+ pulses.  Lymph: No lymphadenopathy of posterior or anterior cervical chain or axillae bilaterally.  Gait normal with good balance and coordination.  MSK:  Non tender with full range of motion and good stability and symmetric strength and  tone of shoulders, elbows, wrist, hip, knee and ankles bilaterally.     Impression and Recommendations:     This case required medical decision making of moderate complexity. The above documentation has been reviewed and is accurate and complete Jennifer SaaZachary M Rinoa Garramone, DO       Note: This dictation was prepared with Dragon dictation along with smaller phrase technology. Any transcriptional errors that result from this process are unintentional.

## 2019-03-12 DIAGNOSIS — Z03818 Encounter for observation for suspected exposure to other biological agents ruled out: Secondary | ICD-10-CM | POA: Diagnosis not present

## 2019-03-14 DIAGNOSIS — F419 Anxiety disorder, unspecified: Secondary | ICD-10-CM | POA: Diagnosis not present

## 2019-03-26 DIAGNOSIS — M25551 Pain in right hip: Secondary | ICD-10-CM | POA: Diagnosis not present

## 2019-04-07 DIAGNOSIS — F419 Anxiety disorder, unspecified: Secondary | ICD-10-CM | POA: Diagnosis not present

## 2019-04-09 DIAGNOSIS — D1801 Hemangioma of skin and subcutaneous tissue: Secondary | ICD-10-CM | POA: Diagnosis not present

## 2019-04-09 DIAGNOSIS — L812 Freckles: Secondary | ICD-10-CM | POA: Diagnosis not present

## 2019-04-09 DIAGNOSIS — L821 Other seborrheic keratosis: Secondary | ICD-10-CM | POA: Diagnosis not present

## 2019-04-09 DIAGNOSIS — L820 Inflamed seborrheic keratosis: Secondary | ICD-10-CM | POA: Diagnosis not present

## 2019-04-30 ENCOUNTER — Encounter: Payer: Self-pay | Admitting: Family Medicine

## 2019-04-30 ENCOUNTER — Other Ambulatory Visit: Payer: Self-pay

## 2019-05-01 ENCOUNTER — Ambulatory Visit (INDEPENDENT_AMBULATORY_CARE_PROVIDER_SITE_OTHER): Payer: BC Managed Care – PPO | Admitting: Family Medicine

## 2019-05-01 ENCOUNTER — Encounter: Payer: Self-pay | Admitting: Family Medicine

## 2019-05-01 VITALS — HR 79 | Ht 67.0 in | Wt 145.0 lb

## 2019-05-01 DIAGNOSIS — Z0000 Encounter for general adult medical examination without abnormal findings: Secondary | ICD-10-CM | POA: Diagnosis not present

## 2019-05-01 NOTE — Progress Notes (Signed)
Virtual Visit via Video Note  I connected with Jennifer Stokes on 05/01/19 at  8:30 AM EDT by a video enabled telemedicine application and verified that I am speaking with the correct person using two identifiers. hom Location: Patient: home  Provider: office    I discussed the limitations of evaluation and management by telemedicine and the availability of in person appointments. The patient expressed understanding and agreed to proceed.  History of Present Illness: Pt at home  C/o arthritis Hip---but is playing tennis and saw ortho  He did xray and had injection x2 second one works    Past Medical History:  Diagnosis Date  . ADD (attention deficit disorder)    Current Outpatient Medications on File Prior to Visit  Medication Sig Dispense Refill  . Diclofenac Sodium (PENNSAID) 2 % SOLN Place 2 application onto the skin 2 (two) times daily. 112 g 3  . Diclofenac Sodium (PENNSAID) 2 % SOLN Place 2 g onto the skin 2 (two) times daily. 112 g 3  . gabapentin (NEURONTIN) 100 MG capsule Take 2 capsules (200 mg total) by mouth at bedtime. 180 capsule 2  . methylphenidate (RITALIN) 20 MG tablet Take 20 mg by mouth 4 (four) times daily.    . sertraline (ZOLOFT) 25 MG tablet TAKE 1 BY MOUTH DAILY 90 tablet 3  . Vitamin D, Ergocalciferol, (DRISDOL) 50000 units CAPS capsule Take 1 capsule (50,000 Units total) by mouth every 7 (seven) days. 12 capsule 0   No current facility-administered medications on file prior to visit.    Family History  Problem Relation Age of Onset  . Bipolar disorder Son   . Diabetes Neg Hx   . Heart disease Neg Hx   . Hyperlipidemia Neg Hx   . Hypertension Neg Hx   . Stroke Neg Hx    Social History   Socioeconomic History  . Marital status: Married    Spouse name: Not on file  . Number of children: Not on file  . Years of education: Not on file  . Highest education level: Not on file  Occupational History  . Occupation: Network engineer  Social Needs  .  Financial resource strain: Not on file  . Food insecurity    Worry: Not on file    Inability: Not on file  . Transportation needs    Medical: Not on file    Non-medical: Not on file  Tobacco Use  . Smoking status: Never Smoker  . Smokeless tobacco: Never Used  Substance and Sexual Activity  . Alcohol use: No  . Drug use: No  . Sexual activity: Yes    Partners: Male  Lifestyle  . Physical activity    Days per week: Not on file    Minutes per session: Not on file  . Stress: Not on file  Relationships  . Social Herbalist on phone: Not on file    Gets together: Not on file    Attends religious service: Not on file    Active member of club or organization: Not on file    Attends meetings of clubs or organizations: Not on file    Relationship status: Not on file  . Intimate partner violence    Fear of current or ex partner: Not on file    Emotionally abused: Not on file    Physically abused: Not on file    Forced sexual activity: Not on file  Other Topics Concern  . Not on file  Social  History Narrative  . Not on file   Past Surgical History:  Procedure Laterality Date  . COSMETIC SURGERY     Observations/Objective:  Vitals:   05/01/19 0821  Pulse: 79   No other vitals obtained Pt is in NAD  Assessment and Plan: 1. Preventative health care Check labs  See avs ghm utd - Lipid panel; Future - CBC with Differential/Platelet; Future - TSH; Future - Comprehensive metabolic panel; Future   Follow Up Instructions:    I discussed the assessment and treatment plan with the patient. The patient was provided an opportunity to ask questions and all were answered. The patient agreed with the plan and demonstrated an understanding of the instructions.   The patient was advised to call back or seek an in-person evaluation if the symptoms worsen or if the condition fails to improve as anticipated.  I provided 30 minutes of non-face-to-face time during this  encounter.   Donato SchultzYvonne R Lowne Chase, DO

## 2019-05-01 NOTE — Telephone Encounter (Signed)
So this does not mean she had covid 19---- it just means she had a corona virus----- may or may not have been covid 19 Pt has appointment this am

## 2019-05-05 DIAGNOSIS — F419 Anxiety disorder, unspecified: Secondary | ICD-10-CM | POA: Diagnosis not present

## 2019-05-16 ENCOUNTER — Other Ambulatory Visit: Payer: Self-pay

## 2019-05-16 DIAGNOSIS — Z20822 Contact with and (suspected) exposure to covid-19: Secondary | ICD-10-CM

## 2019-05-16 DIAGNOSIS — R6889 Other general symptoms and signs: Secondary | ICD-10-CM | POA: Diagnosis not present

## 2019-05-18 LAB — NOVEL CORONAVIRUS, NAA: SARS-CoV-2, NAA: NOT DETECTED

## 2019-05-22 DIAGNOSIS — F419 Anxiety disorder, unspecified: Secondary | ICD-10-CM | POA: Diagnosis not present

## 2019-05-28 DIAGNOSIS — F411 Generalized anxiety disorder: Secondary | ICD-10-CM | POA: Diagnosis not present

## 2019-05-28 DIAGNOSIS — F900 Attention-deficit hyperactivity disorder, predominantly inattentive type: Secondary | ICD-10-CM | POA: Diagnosis not present

## 2019-05-30 ENCOUNTER — Encounter: Payer: Self-pay | Admitting: Family Medicine

## 2019-06-11 ENCOUNTER — Encounter: Payer: Self-pay | Admitting: Family Medicine

## 2019-06-18 DIAGNOSIS — F419 Anxiety disorder, unspecified: Secondary | ICD-10-CM | POA: Diagnosis not present

## 2019-07-15 DIAGNOSIS — F419 Anxiety disorder, unspecified: Secondary | ICD-10-CM | POA: Diagnosis not present

## 2019-07-17 DIAGNOSIS — H04123 Dry eye syndrome of bilateral lacrimal glands: Secondary | ICD-10-CM | POA: Diagnosis not present

## 2019-07-17 DIAGNOSIS — H2513 Age-related nuclear cataract, bilateral: Secondary | ICD-10-CM | POA: Diagnosis not present

## 2019-07-18 DIAGNOSIS — M25511 Pain in right shoulder: Secondary | ICD-10-CM | POA: Diagnosis not present

## 2019-07-18 DIAGNOSIS — M542 Cervicalgia: Secondary | ICD-10-CM | POA: Diagnosis not present

## 2019-09-05 DIAGNOSIS — Z20828 Contact with and (suspected) exposure to other viral communicable diseases: Secondary | ICD-10-CM | POA: Diagnosis not present

## 2019-09-21 DIAGNOSIS — Z20828 Contact with and (suspected) exposure to other viral communicable diseases: Secondary | ICD-10-CM | POA: Diagnosis not present

## 2019-09-22 ENCOUNTER — Other Ambulatory Visit: Payer: Self-pay | Admitting: Family Medicine

## 2019-09-22 DIAGNOSIS — Z1231 Encounter for screening mammogram for malignant neoplasm of breast: Secondary | ICD-10-CM

## 2019-09-23 ENCOUNTER — Ambulatory Visit: Payer: BC Managed Care – PPO

## 2019-10-06 DIAGNOSIS — F4322 Adjustment disorder with anxiety: Secondary | ICD-10-CM | POA: Diagnosis not present

## 2019-10-13 DIAGNOSIS — F4322 Adjustment disorder with anxiety: Secondary | ICD-10-CM | POA: Diagnosis not present

## 2019-10-27 DIAGNOSIS — F4323 Adjustment disorder with mixed anxiety and depressed mood: Secondary | ICD-10-CM | POA: Diagnosis not present

## 2019-11-06 DIAGNOSIS — F4323 Adjustment disorder with mixed anxiety and depressed mood: Secondary | ICD-10-CM | POA: Diagnosis not present

## 2019-11-13 DIAGNOSIS — F4323 Adjustment disorder with mixed anxiety and depressed mood: Secondary | ICD-10-CM | POA: Diagnosis not present

## 2019-11-21 DIAGNOSIS — F411 Generalized anxiety disorder: Secondary | ICD-10-CM | POA: Diagnosis not present

## 2019-11-21 DIAGNOSIS — F900 Attention-deficit hyperactivity disorder, predominantly inattentive type: Secondary | ICD-10-CM | POA: Diagnosis not present

## 2019-11-25 DIAGNOSIS — Z83518 Family history of other specified eye disorder: Secondary | ICD-10-CM | POA: Diagnosis not present

## 2019-11-25 DIAGNOSIS — H02403 Unspecified ptosis of bilateral eyelids: Secondary | ICD-10-CM | POA: Diagnosis not present

## 2019-11-25 DIAGNOSIS — H2513 Age-related nuclear cataract, bilateral: Secondary | ICD-10-CM | POA: Diagnosis not present

## 2019-11-25 DIAGNOSIS — L988 Other specified disorders of the skin and subcutaneous tissue: Secondary | ICD-10-CM | POA: Diagnosis not present

## 2019-12-05 DIAGNOSIS — F4323 Adjustment disorder with mixed anxiety and depressed mood: Secondary | ICD-10-CM | POA: Diagnosis not present

## 2019-12-23 DIAGNOSIS — F4323 Adjustment disorder with mixed anxiety and depressed mood: Secondary | ICD-10-CM | POA: Diagnosis not present

## 2020-01-06 DIAGNOSIS — F4323 Adjustment disorder with mixed anxiety and depressed mood: Secondary | ICD-10-CM | POA: Diagnosis not present

## 2020-01-22 DIAGNOSIS — F4323 Adjustment disorder with mixed anxiety and depressed mood: Secondary | ICD-10-CM | POA: Diagnosis not present

## 2020-02-25 DIAGNOSIS — Z01419 Encounter for gynecological examination (general) (routine) without abnormal findings: Secondary | ICD-10-CM | POA: Diagnosis not present

## 2020-02-25 DIAGNOSIS — Z6822 Body mass index (BMI) 22.0-22.9, adult: Secondary | ICD-10-CM | POA: Diagnosis not present

## 2020-02-25 DIAGNOSIS — Z1231 Encounter for screening mammogram for malignant neoplasm of breast: Secondary | ICD-10-CM | POA: Diagnosis not present

## 2020-02-26 DIAGNOSIS — Z01419 Encounter for gynecological examination (general) (routine) without abnormal findings: Secondary | ICD-10-CM | POA: Diagnosis not present

## 2020-03-01 ENCOUNTER — Other Ambulatory Visit: Payer: Self-pay | Admitting: Obstetrics and Gynecology

## 2020-03-01 DIAGNOSIS — R928 Other abnormal and inconclusive findings on diagnostic imaging of breast: Secondary | ICD-10-CM

## 2020-03-09 ENCOUNTER — Ambulatory Visit: Payer: BC Managed Care – PPO

## 2020-03-09 ENCOUNTER — Ambulatory Visit
Admission: RE | Admit: 2020-03-09 | Discharge: 2020-03-09 | Disposition: A | Discharge status: Home / Self Care | Payer: BC Managed Care – PPO | Source: Ambulatory Visit | Attending: Obstetrics and Gynecology | Admitting: Obstetrics and Gynecology

## 2020-03-09 ENCOUNTER — Other Ambulatory Visit: Payer: Self-pay

## 2020-03-09 DIAGNOSIS — R928 Other abnormal and inconclusive findings on diagnostic imaging of breast: Secondary | ICD-10-CM

## 2020-03-11 DIAGNOSIS — Z13228 Encounter for screening for other metabolic disorders: Secondary | ICD-10-CM | POA: Diagnosis not present

## 2020-03-11 DIAGNOSIS — Z1321 Encounter for screening for nutritional disorder: Secondary | ICD-10-CM | POA: Diagnosis not present

## 2020-03-11 DIAGNOSIS — Z1329 Encounter for screening for other suspected endocrine disorder: Secondary | ICD-10-CM | POA: Diagnosis not present

## 2020-03-11 DIAGNOSIS — Z131 Encounter for screening for diabetes mellitus: Secondary | ICD-10-CM | POA: Diagnosis not present

## 2020-03-22 DIAGNOSIS — M25561 Pain in right knee: Secondary | ICD-10-CM | POA: Diagnosis not present

## 2020-03-22 DIAGNOSIS — M542 Cervicalgia: Secondary | ICD-10-CM | POA: Diagnosis not present

## 2020-03-22 DIAGNOSIS — M25551 Pain in right hip: Secondary | ICD-10-CM | POA: Diagnosis not present

## 2020-03-24 DIAGNOSIS — M25551 Pain in right hip: Secondary | ICD-10-CM | POA: Diagnosis not present

## 2020-04-14 DIAGNOSIS — D1801 Hemangioma of skin and subcutaneous tissue: Secondary | ICD-10-CM | POA: Diagnosis not present

## 2020-04-14 DIAGNOSIS — L814 Other melanin hyperpigmentation: Secondary | ICD-10-CM | POA: Diagnosis not present

## 2020-04-14 DIAGNOSIS — L821 Other seborrheic keratosis: Secondary | ICD-10-CM | POA: Diagnosis not present

## 2020-05-07 DIAGNOSIS — F411 Generalized anxiety disorder: Secondary | ICD-10-CM | POA: Diagnosis not present

## 2020-05-07 DIAGNOSIS — F900 Attention-deficit hyperactivity disorder, predominantly inattentive type: Secondary | ICD-10-CM | POA: Diagnosis not present

## 2020-05-12 DIAGNOSIS — Z03818 Encounter for observation for suspected exposure to other biological agents ruled out: Secondary | ICD-10-CM | POA: Diagnosis not present

## 2020-06-08 DIAGNOSIS — Z20828 Contact with and (suspected) exposure to other viral communicable diseases: Secondary | ICD-10-CM | POA: Diagnosis not present

## 2020-07-22 DIAGNOSIS — H04123 Dry eye syndrome of bilateral lacrimal glands: Secondary | ICD-10-CM | POA: Diagnosis not present

## 2020-07-22 DIAGNOSIS — H5213 Myopia, bilateral: Secondary | ICD-10-CM | POA: Diagnosis not present

## 2020-07-22 DIAGNOSIS — H524 Presbyopia: Secondary | ICD-10-CM | POA: Diagnosis not present

## 2020-07-22 DIAGNOSIS — H2513 Age-related nuclear cataract, bilateral: Secondary | ICD-10-CM | POA: Diagnosis not present

## 2020-07-28 DIAGNOSIS — Z20828 Contact with and (suspected) exposure to other viral communicable diseases: Secondary | ICD-10-CM | POA: Diagnosis not present

## 2020-10-01 DIAGNOSIS — F900 Attention-deficit hyperactivity disorder, predominantly inattentive type: Secondary | ICD-10-CM | POA: Diagnosis not present

## 2020-10-01 DIAGNOSIS — F411 Generalized anxiety disorder: Secondary | ICD-10-CM | POA: Diagnosis not present

## 2020-11-22 ENCOUNTER — Encounter: Payer: Self-pay | Admitting: Family Medicine

## 2020-12-01 DIAGNOSIS — M25551 Pain in right hip: Secondary | ICD-10-CM | POA: Diagnosis not present

## 2020-12-01 DIAGNOSIS — M25562 Pain in left knee: Secondary | ICD-10-CM | POA: Diagnosis not present

## 2020-12-06 ENCOUNTER — Encounter: Payer: BC Managed Care – PPO | Admitting: Family Medicine

## 2020-12-13 ENCOUNTER — Encounter: Payer: Self-pay | Admitting: Family Medicine

## 2020-12-14 ENCOUNTER — Other Ambulatory Visit: Payer: Self-pay

## 2020-12-14 ENCOUNTER — Ambulatory Visit (INDEPENDENT_AMBULATORY_CARE_PROVIDER_SITE_OTHER): Payer: BC Managed Care – PPO | Admitting: Family Medicine

## 2020-12-14 ENCOUNTER — Encounter: Payer: Self-pay | Admitting: Family Medicine

## 2020-12-14 VITALS — BP 108/80 | HR 94 | Temp 99.2°F | Resp 18 | Ht 67.0 in | Wt 152.2 lb

## 2020-12-14 DIAGNOSIS — M25551 Pain in right hip: Secondary | ICD-10-CM | POA: Insufficient documentation

## 2020-12-14 DIAGNOSIS — Z20822 Contact with and (suspected) exposure to covid-19: Secondary | ICD-10-CM | POA: Diagnosis not present

## 2020-12-14 DIAGNOSIS — Z1231 Encounter for screening mammogram for malignant neoplasm of breast: Secondary | ICD-10-CM

## 2020-12-14 DIAGNOSIS — Z01818 Encounter for other preprocedural examination: Secondary | ICD-10-CM | POA: Insufficient documentation

## 2020-12-14 DIAGNOSIS — Z0000 Encounter for general adult medical examination without abnormal findings: Secondary | ICD-10-CM | POA: Insufficient documentation

## 2020-12-14 HISTORY — PX: TOTAL HIP ARTHROPLASTY: SHX124

## 2020-12-14 NOTE — Assessment & Plan Note (Signed)
See above

## 2020-12-14 NOTE — Assessment & Plan Note (Signed)
Pt cleared for surgery  

## 2020-12-14 NOTE — Progress Notes (Signed)
Subjective:     Jennifer Stokes is a 65 y.o. female and is here for a comprehensive physical exam. The patient reports no new problems -- she is having R hip replacement with Dr Eulah Pont .  Social History   Socioeconomic History  . Marital status: Married    Spouse name: Not on file  . Number of children: Not on file  . Years of education: Not on file  . Highest education level: Not on file  Occupational History  . Occupation: Forensic scientist  Tobacco Use  . Smoking status: Never Smoker  . Smokeless tobacco: Never Used  Substance and Sexual Activity  . Alcohol use: No  . Drug use: No  . Sexual activity: Yes    Partners: Male  Other Topics Concern  . Not on file  Social History Narrative  . Not on file   Social Determinants of Health   Financial Resource Strain: Not on file  Food Insecurity: Not on file  Transportation Needs: Not on file  Physical Activity: Not on file  Stress: Not on file  Social Connections: Not on file  Intimate Partner Violence: Not on file   Health Maintenance  Topic Date Due  . MAMMOGRAM  02/15/2021  . PAP SMEAR-Modifier  04/25/2021  . TETANUS/TDAP  10/02/2021  . COLONOSCOPY (Pts 45-38yrs Insurance coverage will need to be confirmed)  03/03/2024  . INFLUENZA VACCINE  Completed  . COVID-19 Vaccine  Completed  . Hepatitis C Screening  Completed  . HIV Screening  Addressed  . HPV VACCINES  Aged Out    The following portions of the patient's history were reviewed and updated as appropriate:  She  has a past medical history of ADD (attention deficit disorder). She does not have any pertinent problems on file. She  has a past surgical history that includes Cosmetic surgery. Her family history includes Bipolar disorder in her son. She  reports that she has never smoked. She has never used smokeless tobacco. She reports that she does not drink alcohol and does not use drugs. She has a current medication list which includes the following  prescription(s): jornay pm, sertraline, diclofenac sodium, diclofenac sodium, gabapentin, methylphenidate, and vitamin d (ergocalciferol). Current Outpatient Medications on File Prior to Visit  Medication Sig Dispense Refill  . Methylphenidate HCl ER, PM, (JORNAY PM) 100 MG CP24 Jornay PM 100 mg capsule,delayed release,extended release sprinkle  TAKE 1 CAPSULE BY MOUTH AT BEDTIME    . sertraline (ZOLOFT) 25 MG tablet TAKE 1 BY MOUTH DAILY 90 tablet 3  . Diclofenac Sodium (PENNSAID) 2 % SOLN Place 2 application onto the skin 2 (two) times daily. 112 g 3  . Diclofenac Sodium (PENNSAID) 2 % SOLN Place 2 g onto the skin 2 (two) times daily. 112 g 3  . gabapentin (NEURONTIN) 100 MG capsule Take 2 capsules (200 mg total) by mouth at bedtime. 180 capsule 2  . methylphenidate (RITALIN) 20 MG tablet Take 20 mg by mouth 4 (four) times daily.    . Vitamin D, Ergocalciferol, (DRISDOL) 50000 units CAPS capsule Take 1 capsule (50,000 Units total) by mouth every 7 (seven) days. 12 capsule 0   No current facility-administered medications on file prior to visit.   She is allergic to prednisone..  Review of Systems Review of Systems  Constitutional: Negative for activity change, appetite change and fatigue.  HENT: Negative for hearing loss, congestion, tinnitus and ear discharge.  dentist q38m Eyes: Negative for visual disturbance (see optho q1y -- vision corrected  to 20/20 with glasses).  Respiratory: Negative for cough, chest tightness and shortness of breath.   Cardiovascular: Negative for chest pain, palpitations and leg swelling.  Gastrointestinal: Negative for abdominal pain, diarrhea, constipation and abdominal distention.  Genitourinary: Negative for urgency, frequency, decreased urine volume and difficulty urinating.  Musculoskeletal: Negative for back pain, arthralgias and gait problem.  Skin: Negative for color change, pallor and rash.  Neurological: Negative for dizziness, light-headedness,  numbness and headaches.  Hematological: Negative for adenopathy. Does not bruise/bleed easily.  Psychiatric/Behavioral: Negative for suicidal ideas, confusion, sleep disturbance, self-injury, dysphoric mood, decreased concentration and agitation.     e  Objective:    BP 108/80 (BP Location: Right Arm, Patient Position: Sitting, Cuff Size: Normal)   Pulse 94   Temp 99.2 F (37.3 C) (Oral)   Resp 18   Ht 5\' 7"  (1.702 m)   Wt 152 lb 3.2 oz (69 kg)   SpO2 98%   BMI 23.84 kg/m  General appearance: alert, cooperative, appears stated age and no distress Head: Normocephalic, without obvious abnormality, atraumatic Eyes: negative findings: lids and lashes normal, conjunctivae and sclerae normal and pupils equal, round, reactive to light and accomodation Ears: normal TM's and external ear canals both ears Neck: no adenopathy, no carotid bruit, no JVD, supple, symmetrical, trachea midline and thyroid not enlarged, symmetric, no tenderness/mass/nodules Back: symmetric, no curvature. ROM normal. No CVA tenderness. Lungs: clear to auscultation bilaterally Heart: regular rate and rhythm, S1, S2 normal, no murmur, click, rub or gallop Abdomen: soft, non-tender; bowel sounds normal; no masses,  no organomegaly Extremities: r hip pain Pulses: 2+ and symmetric Skin: Skin color, texture, turgor normal. No rashes or lesions Lymph nodes: Cervical, supraclavicular, and axillary nodes normal. Neurologic: Alert and oriented X 3, normal strength and tone. Normal symmetric reflexes. Normal coordination and gait    Assessment:    Healthy female exam.      Plan:    ghm utd Check labs  See avs  See After Visit Summary for Counseling Recommendations

## 2020-12-14 NOTE — Patient Instructions (Signed)
Preventive Care 64-64 Years Old, Female Preventive care refers to lifestyle choices and visits with your health care provider that can promote health and wellness. This includes:  A yearly physical exam. This is also called an annual wellness visit.  Regular dental and eye exams.  Immunizations.  Screening for certain conditions.  Healthy lifestyle choices, such as: ? Eating a healthy diet. ? Getting regular exercise. ? Not using drugs or products that contain nicotine and tobacco. ? Limiting alcohol use. What can I expect for my preventive care visit? Physical exam Your health care provider will check your:  Height and weight. These may be used to calculate your BMI (body mass index). BMI is a measurement that tells if you are at a healthy weight.  Heart rate and blood pressure.  Body temperature.  Skin for abnormal spots. Counseling Your health care provider may ask you questions about your:  Past medical problems.  Family's medical history.  Alcohol, tobacco, and drug use.  Emotional well-being.  Home life and relationship well-being.  Sexual activity.  Diet, exercise, and sleep habits.  Work and work Statistician.  Access to firearms.  Method of birth control.  Menstrual cycle.  Pregnancy history. What immunizations do I need? Vaccines are usually given at various ages, according to a schedule. Your health care provider will recommend vaccines for you based on your age, medical history, and lifestyle or other factors, such as travel or where you work.   What tests do I need? Blood tests  Lipid and cholesterol levels. These may be checked every 5 years, or more often if you are over 64 years old.  Hepatitis C test.  Hepatitis B test. Screening  Lung cancer screening. You may have this screening every year starting at age 64 if you have a 30-pack-year history of smoking and currently smoke or have quit within the past 15 years.  Colorectal cancer  screening. ? All adults should have this screening starting at age 64 and continuing until age 17. ? Your health care provider may recommend screening at age 64 if you are at increased risk. ? You will have tests every 1-10 years, depending on your results and the type of screening test.  Diabetes screening. ? This is done by checking your blood sugar (glucose) after you have not eaten for a while (fasting). ? You may have this done every 1-3 years.  Mammogram. ? This may be done every 1-2 years. ? Talk with your health care provider about when you should start having regular mammograms. This may depend on whether you have a family history of breast cancer.  BRCA-related cancer screening. This may be done if you have a family history of breast, ovarian, tubal, or peritoneal cancers.  Pelvic exam and Pap test. ? This may be done every 3 years starting at age 10. ? Starting at age 64, this may be done every 5 years if you have a Pap test in combination with an HPV test. Other tests  STD (sexually transmitted disease) testing, if you are at risk.  Bone density scan. This is done to screen for osteoporosis. You may have this scan if you are at high risk for osteoporosis. Talk with your health care provider about your test results, treatment options, and if necessary, the need for more tests. Follow these instructions at home: Eating and drinking  Eat a diet that includes fresh fruits and vegetables, whole grains, lean protein, and low-fat dairy products.  Take vitamin and mineral supplements  as recommended by your health care provider.  Do not drink alcohol if: ? Your health care provider tells you not to drink. ? You are pregnant, may be pregnant, or are planning to become pregnant.  If you drink alcohol: ? Limit how much you have to 0-1 drink a day. ? Be aware of how much alcohol is in your drink. In the [place] one drink equals one 12 oz bottle of beer (355 mL), one 5 oz glass of  wine (148 mL), or one 1 oz glass of hard liquor (44 mL).   Lifestyle  Take daily care of your teeth and gums. Brush your teeth every morning and night with fluoride toothpaste. Floss one time each day.  Stay active. Exercise for at least 30 minutes 5 or more days each week.  Do not use any products that contain nicotine or tobacco, such as cigarettes, e-cigarettes, and chewing tobacco. If you need help quitting, ask your health care provider.  Do not use drugs.  If you are sexually active, practice safe sex. Use a condom or other form of protection to prevent STIs (sexually transmitted infections).  If you do not wish to become pregnant, use a form of birth control. If you plan to become pregnant, see your health care provider for a prepregnancy visit.  If told by your health care provider, take low-dose aspirin daily starting at age 50.  Find healthy ways to cope with stress, such as: ? Meditation, yoga, or listening to music. ? Journaling. ? Talking to a trusted person. ? Spending time with friends and family. Safety  Always wear your seat belt while driving or riding in a vehicle.  Do not drive: ? If you have been drinking alcohol. Do not ride with someone who has been drinking. ? When you are tired or distracted. ? While texting.  Wear a helmet and other protective equipment during sports activities.  If you have firearms in your house, make sure you follow all gun safety procedures. What's next?  Visit your health care provider once a year for an annual wellness visit.  Ask your health care provider how often you should have your eyes and teeth checked.  Stay up to date on all vaccines. This information is not intended to replace advice given to you by your health care provider. Make sure you discuss any questions you have with your health care provider. Document Revised: 07/06/2020 Document Reviewed: 06/13/2018 Elsevier Patient Education  2021 Elsevier Inc.  

## 2020-12-15 ENCOUNTER — Other Ambulatory Visit: Payer: Self-pay | Admitting: Family Medicine

## 2020-12-15 DIAGNOSIS — M25551 Pain in right hip: Secondary | ICD-10-CM | POA: Diagnosis not present

## 2020-12-15 DIAGNOSIS — E2839 Other primary ovarian failure: Secondary | ICD-10-CM

## 2020-12-15 DIAGNOSIS — M1611 Unilateral primary osteoarthritis, right hip: Secondary | ICD-10-CM | POA: Diagnosis not present

## 2020-12-15 LAB — LIPID PANEL
Cholesterol: 263 mg/dL — ABNORMAL HIGH (ref 0–200)
HDL: 109.4 mg/dL (ref >39.00)
LDL Cholesterol: 138 mg/dL — ABNORMAL HIGH (ref 0–99)
NonHDL: 153.38
Total CHOL/HDL Ratio: 2
Triglycerides: 75 mg/dL (ref 0.0–149.0)
VLDL: 15 mg/dL (ref 0.0–40.0)

## 2020-12-15 LAB — CBC WITH DIFFERENTIAL/PLATELET
Basophils Absolute: 0 10*3/uL (ref 0.0–0.1)
Basophils Relative: 0.7 % (ref 0.0–3.0)
Eosinophils Absolute: 0.1 10*3/uL (ref 0.0–0.7)
Eosinophils Relative: 1.6 % (ref 0.0–5.0)
HCT: 41.7 % (ref 36.0–46.0)
Hemoglobin: 14 g/dL (ref 12.0–15.0)
Lymphocytes Relative: 32.4 % (ref 12.0–46.0)
Lymphs Abs: 1.6 10*3/uL (ref 0.7–4.0)
MCHC: 33.5 g/dL (ref 30.0–36.0)
MCV: 93.6 fl (ref 78.0–100.0)
Monocytes Absolute: 0.4 10*3/uL (ref 0.1–1.0)
Monocytes Relative: 8.5 % (ref 3.0–12.0)
Neutro Abs: 2.8 10*3/uL (ref 1.4–7.7)
Neutrophils Relative %: 56.8 % (ref 43.0–77.0)
Platelets: 265 10*3/uL (ref 150.0–400.0)
RBC: 4.46 Mil/uL (ref 3.87–5.11)
RDW: 12.8 % (ref 11.5–15.5)
WBC: 5 10*3/uL (ref 4.0–10.5)

## 2020-12-15 LAB — COMPREHENSIVE METABOLIC PANEL
ALT: 23 U/L (ref 0–35)
AST: 23 U/L (ref 0–37)
Albumin: 4.5 g/dL (ref 3.5–5.2)
Alkaline Phosphatase: 103 U/L (ref 39–117)
BUN: 17 mg/dL (ref 6–23)
CO2: 30 mEq/L (ref 19–32)
Calcium: 9.7 mg/dL (ref 8.4–10.5)
Chloride: 101 mEq/L (ref 96–112)
Creatinine, Ser: 0.84 mg/dL (ref 0.40–1.20)
GFR: 73.78 mL/min (ref >60.00)
Glucose, Bld: 103 mg/dL — ABNORMAL HIGH (ref 70–99)
Potassium: 4.4 mEq/L (ref 3.5–5.1)
Sodium: 137 mEq/L (ref 135–145)
Total Bilirubin: 0.4 mg/dL (ref 0.2–1.2)
Total Protein: 6.8 g/dL (ref 6.0–8.3)

## 2020-12-16 ENCOUNTER — Encounter: Payer: Self-pay | Admitting: Family Medicine

## 2020-12-16 LAB — TSH: TSH: 1.57 u[IU]/mL (ref 0.35–4.50)

## 2020-12-16 LAB — SARS-COV-2 ANTIBODY, IGM

## 2020-12-16 NOTE — Telephone Encounter (Signed)
I don't think we can do this? Labs were sent to [facility] and the antibodies test was cancelled? Please advise

## 2020-12-16 NOTE — Telephone Encounter (Signed)
I just sent a response back to you in result note restating reason test was cancelled and alternative test available. I will call [facility] to add the available test if that is comparable to the previous test ordered? Please advise?

## 2020-12-17 DIAGNOSIS — M1611 Unilateral primary osteoarthritis, right hip: Secondary | ICD-10-CM | POA: Diagnosis not present

## 2020-12-17 NOTE — Telephone Encounter (Signed)
New Test code has been added per Deanna Artis at [facility]. Message has been sent to pt as well.

## 2020-12-17 NOTE — Telephone Encounter (Signed)
Please do thank you ! 

## 2020-12-20 DIAGNOSIS — Z01812 Encounter for preprocedural laboratory examination: Secondary | ICD-10-CM | POA: Diagnosis not present

## 2020-12-20 DIAGNOSIS — M25551 Pain in right hip: Secondary | ICD-10-CM | POA: Diagnosis not present

## 2020-12-20 DIAGNOSIS — M1611 Unilateral primary osteoarthritis, right hip: Secondary | ICD-10-CM | POA: Diagnosis not present

## 2020-12-24 LAB — COVID-19 ANTIBODIES IGM: Value: 0.1

## 2020-12-24 LAB — SPECIMEN STATUS REPORT

## 2020-12-28 ENCOUNTER — Encounter: Payer: Self-pay | Admitting: Family Medicine

## 2020-12-28 NOTE — Telephone Encounter (Signed)
Are you okay filling this out? I will print it off if you are

## 2020-12-28 NOTE — Telephone Encounter (Signed)
I can fill it out

## 2020-12-29 ENCOUNTER — Telehealth: Payer: Self-pay | Admitting: Family Medicine

## 2020-12-29 NOTE — Telephone Encounter (Signed)
Jennifer Stokes from the Director of [facility] [employer] Caller # [phone]  She would like for Dr.Lownes to sign the order because patient is being admitted tomorrow Please advice

## 2020-12-29 NOTE — Telephone Encounter (Signed)
Called back. LVM advising that orders were faxed this morning and received confirmation.

## 2020-12-29 NOTE — Telephone Encounter (Signed)
Form printed. Will give to [facility] to sign

## 2020-12-30 DIAGNOSIS — M1611 Unilateral primary osteoarthritis, right hip: Secondary | ICD-10-CM | POA: Diagnosis not present

## 2021-01-05 ENCOUNTER — Encounter: Payer: Self-pay | Admitting: Family Medicine

## 2021-01-05 NOTE — Telephone Encounter (Signed)
It detoxifys the body--- I have not heard of it being used for swelling  It has side effect of constipation In the hosp we use it in over doses -- it causes vomiting in high doses  In smaller doses it it safe

## 2021-01-11 ENCOUNTER — Encounter: Payer: Self-pay | Admitting: Family Medicine

## 2021-01-12 NOTE — Telephone Encounter (Signed)
Yes --- she can get the booster---  let her know it is available in our pharmacy Mon, Tues and Frid  9-3

## 2021-02-24 ENCOUNTER — Other Ambulatory Visit: Payer: Self-pay | Admitting: Family Medicine

## 2021-02-24 DIAGNOSIS — R140 Abdominal distension (gaseous): Secondary | ICD-10-CM | POA: Diagnosis not present

## 2021-02-24 DIAGNOSIS — K5904 Chronic idiopathic constipation: Secondary | ICD-10-CM | POA: Diagnosis not present

## 2021-02-24 DIAGNOSIS — Z1211 Encounter for screening for malignant neoplasm of colon: Secondary | ICD-10-CM | POA: Diagnosis not present

## 2021-02-24 DIAGNOSIS — R194 Change in bowel habit: Secondary | ICD-10-CM | POA: Diagnosis not present

## 2021-02-24 DIAGNOSIS — E2839 Other primary ovarian failure: Secondary | ICD-10-CM

## 2021-02-24 DIAGNOSIS — Z1231 Encounter for screening mammogram for malignant neoplasm of breast: Secondary | ICD-10-CM

## 2021-02-25 ENCOUNTER — Other Ambulatory Visit: Payer: Self-pay | Admitting: Gastroenterology

## 2021-02-25 ENCOUNTER — Other Ambulatory Visit: Payer: Self-pay

## 2021-02-25 ENCOUNTER — Ambulatory Visit
Admission: RE | Admit: 2021-02-25 | Discharge: 2021-02-25 | Disposition: A | Discharge status: Home / Self Care | Payer: BC Managed Care – PPO | Source: Ambulatory Visit | Attending: Family Medicine | Admitting: Family Medicine

## 2021-02-25 DIAGNOSIS — M8588 Other specified disorders of bone density and structure, other site: Secondary | ICD-10-CM | POA: Diagnosis not present

## 2021-02-25 DIAGNOSIS — E2839 Other primary ovarian failure: Secondary | ICD-10-CM

## 2021-02-25 DIAGNOSIS — Z780 Asymptomatic menopausal state: Secondary | ICD-10-CM | POA: Diagnosis not present

## 2021-02-25 DIAGNOSIS — R140 Abdominal distension (gaseous): Secondary | ICD-10-CM

## 2021-02-28 ENCOUNTER — Ambulatory Visit
Admission: RE | Admit: 2021-02-28 | Discharge: 2021-02-28 | Disposition: A | Discharge status: Home / Self Care | Payer: BC Managed Care – PPO | Source: Ambulatory Visit | Attending: Gastroenterology | Admitting: Gastroenterology

## 2021-02-28 DIAGNOSIS — R140 Abdominal distension (gaseous): Secondary | ICD-10-CM | POA: Diagnosis not present

## 2021-03-25 DIAGNOSIS — D123 Benign neoplasm of transverse colon: Secondary | ICD-10-CM | POA: Diagnosis not present

## 2021-03-25 DIAGNOSIS — D122 Benign neoplasm of ascending colon: Secondary | ICD-10-CM | POA: Diagnosis not present

## 2021-03-25 DIAGNOSIS — R194 Change in bowel habit: Secondary | ICD-10-CM | POA: Diagnosis not present

## 2021-03-25 DIAGNOSIS — Z1211 Encounter for screening for malignant neoplasm of colon: Secondary | ICD-10-CM | POA: Diagnosis not present

## 2021-03-25 DIAGNOSIS — K6389 Other specified diseases of intestine: Secondary | ICD-10-CM | POA: Diagnosis not present

## 2021-03-25 DIAGNOSIS — K635 Polyp of colon: Secondary | ICD-10-CM | POA: Diagnosis not present

## 2021-03-25 LAB — HM COLONOSCOPY

## 2021-04-04 DIAGNOSIS — F411 Generalized anxiety disorder: Secondary | ICD-10-CM | POA: Diagnosis not present

## 2021-04-04 DIAGNOSIS — F900 Attention-deficit hyperactivity disorder, predominantly inattentive type: Secondary | ICD-10-CM | POA: Diagnosis not present

## 2021-04-22 DIAGNOSIS — M1611 Unilateral primary osteoarthritis, right hip: Secondary | ICD-10-CM | POA: Diagnosis not present

## 2021-04-25 DIAGNOSIS — L812 Freckles: Secondary | ICD-10-CM | POA: Diagnosis not present

## 2021-04-25 DIAGNOSIS — L821 Other seborrheic keratosis: Secondary | ICD-10-CM | POA: Diagnosis not present

## 2021-04-25 DIAGNOSIS — D1801 Hemangioma of skin and subcutaneous tissue: Secondary | ICD-10-CM | POA: Diagnosis not present

## 2021-04-26 DIAGNOSIS — R531 Weakness: Secondary | ICD-10-CM | POA: Diagnosis not present

## 2021-04-26 DIAGNOSIS — R2689 Other abnormalities of gait and mobility: Secondary | ICD-10-CM | POA: Diagnosis not present

## 2021-04-26 DIAGNOSIS — M1611 Unilateral primary osteoarthritis, right hip: Secondary | ICD-10-CM | POA: Diagnosis not present

## 2021-04-26 DIAGNOSIS — M25551 Pain in right hip: Secondary | ICD-10-CM | POA: Diagnosis not present

## 2021-04-27 ENCOUNTER — Telehealth: Payer: BC Managed Care – PPO | Admitting: Physician Assistant

## 2021-04-27 ENCOUNTER — Encounter: Payer: BC Managed Care – PPO | Admitting: Physician Assistant

## 2021-04-27 DIAGNOSIS — J019 Acute sinusitis, unspecified: Secondary | ICD-10-CM | POA: Diagnosis not present

## 2021-04-27 DIAGNOSIS — B9689 Other specified bacterial agents as the cause of diseases classified elsewhere: Secondary | ICD-10-CM | POA: Diagnosis not present

## 2021-04-27 MED ORDER — AMOXICILLIN-POT CLAVULANATE 875-125 MG PO TABS: 1.0000 | ORAL_TABLET | Freq: Two times a day (BID) | ORAL | 0 refills | Status: DC

## 2021-04-27 NOTE — Progress Notes (Signed)
Message sent to patient requesting further input regarding current symptoms. Awaiting patient response.  

## 2021-04-27 NOTE — Progress Notes (Signed)

## 2021-04-27 NOTE — Progress Notes (Signed)
Duplicate encounter. Please disregard.

## 2021-04-27 NOTE — Progress Notes (Signed)
I have spent 5 minutes in review of e-visit questionnaire, review and updating patient chart, medical decision making and response to patient.   Gareth Fitzner Cody Selah Zelman, PA-C    

## 2021-04-28 ENCOUNTER — Ambulatory Visit
Admission: RE | Admit: 2021-04-28 | Discharge: 2021-04-28 | Disposition: A | Discharge status: Home / Self Care | Payer: BC Managed Care – PPO | Source: Ambulatory Visit | Attending: Family Medicine | Admitting: Family Medicine

## 2021-04-28 ENCOUNTER — Other Ambulatory Visit: Payer: Self-pay

## 2021-04-28 ENCOUNTER — Encounter: Payer: Self-pay | Admitting: Family Medicine

## 2021-04-28 DIAGNOSIS — Z1231 Encounter for screening mammogram for malignant neoplasm of breast: Secondary | ICD-10-CM | POA: Diagnosis not present

## 2021-05-04 ENCOUNTER — Telehealth: Payer: Self-pay

## 2021-05-04 DIAGNOSIS — R531 Weakness: Secondary | ICD-10-CM | POA: Diagnosis not present

## 2021-05-04 DIAGNOSIS — M1611 Unilateral primary osteoarthritis, right hip: Secondary | ICD-10-CM | POA: Diagnosis not present

## 2021-05-04 DIAGNOSIS — H10413 Chronic giant papillary conjunctivitis, bilateral: Secondary | ICD-10-CM | POA: Diagnosis not present

## 2021-05-04 DIAGNOSIS — M25551 Pain in right hip: Secondary | ICD-10-CM | POA: Diagnosis not present

## 2021-05-04 DIAGNOSIS — R2689 Other abnormalities of gait and mobility: Secondary | ICD-10-CM | POA: Diagnosis not present

## 2021-05-04 NOTE — Telephone Encounter (Signed)
Patient called stating she has sinusitis and has been on 2 antibiotics and completed an e-visit last week.  She is wanting to know if she can get a referral to Dr. Ezzard Standing, ENT from PCP without being seen.  Please advise.

## 2021-05-05 ENCOUNTER — Other Ambulatory Visit: Payer: Self-pay | Admitting: Family Medicine

## 2021-05-05 DIAGNOSIS — J329 Chronic sinusitis, unspecified: Secondary | ICD-10-CM

## 2021-05-05 NOTE — Telephone Encounter (Signed)
Informed Pt that referral has been placed. She requests Dr. Ezzard Standing, can you guys update referral?

## 2021-05-05 NOTE — Telephone Encounter (Signed)
Please advise 

## 2021-05-11 DIAGNOSIS — M25551 Pain in right hip: Secondary | ICD-10-CM | POA: Diagnosis not present

## 2021-05-11 DIAGNOSIS — R2689 Other abnormalities of gait and mobility: Secondary | ICD-10-CM | POA: Diagnosis not present

## 2021-05-11 DIAGNOSIS — R531 Weakness: Secondary | ICD-10-CM | POA: Diagnosis not present

## 2021-05-11 DIAGNOSIS — M1611 Unilateral primary osteoarthritis, right hip: Secondary | ICD-10-CM | POA: Diagnosis not present

## 2021-05-16 ENCOUNTER — Encounter: Payer: Self-pay | Admitting: Family Medicine

## 2021-05-18 DIAGNOSIS — M1611 Unilateral primary osteoarthritis, right hip: Secondary | ICD-10-CM | POA: Diagnosis not present

## 2021-05-18 DIAGNOSIS — R531 Weakness: Secondary | ICD-10-CM | POA: Diagnosis not present

## 2021-05-18 DIAGNOSIS — R2689 Other abnormalities of gait and mobility: Secondary | ICD-10-CM | POA: Diagnosis not present

## 2021-05-18 DIAGNOSIS — M25551 Pain in right hip: Secondary | ICD-10-CM | POA: Diagnosis not present

## 2021-05-25 DIAGNOSIS — R531 Weakness: Secondary | ICD-10-CM | POA: Diagnosis not present

## 2021-05-25 DIAGNOSIS — M1611 Unilateral primary osteoarthritis, right hip: Secondary | ICD-10-CM | POA: Diagnosis not present

## 2021-05-25 DIAGNOSIS — M25551 Pain in right hip: Secondary | ICD-10-CM | POA: Diagnosis not present

## 2021-05-25 DIAGNOSIS — R2689 Other abnormalities of gait and mobility: Secondary | ICD-10-CM | POA: Diagnosis not present

## 2021-06-01 DIAGNOSIS — H5213 Myopia, bilateral: Secondary | ICD-10-CM | POA: Diagnosis not present

## 2021-06-01 DIAGNOSIS — M1611 Unilateral primary osteoarthritis, right hip: Secondary | ICD-10-CM | POA: Diagnosis not present

## 2021-06-01 DIAGNOSIS — H04123 Dry eye syndrome of bilateral lacrimal glands: Secondary | ICD-10-CM | POA: Diagnosis not present

## 2021-06-01 DIAGNOSIS — M25551 Pain in right hip: Secondary | ICD-10-CM | POA: Diagnosis not present

## 2021-06-01 DIAGNOSIS — R531 Weakness: Secondary | ICD-10-CM | POA: Diagnosis not present

## 2021-06-01 DIAGNOSIS — R2689 Other abnormalities of gait and mobility: Secondary | ICD-10-CM | POA: Diagnosis not present

## 2021-06-01 DIAGNOSIS — H2513 Age-related nuclear cataract, bilateral: Secondary | ICD-10-CM | POA: Diagnosis not present

## 2021-06-15 DIAGNOSIS — M1611 Unilateral primary osteoarthritis, right hip: Secondary | ICD-10-CM | POA: Diagnosis not present

## 2021-06-15 DIAGNOSIS — M25551 Pain in right hip: Secondary | ICD-10-CM | POA: Diagnosis not present

## 2021-06-15 DIAGNOSIS — R2689 Other abnormalities of gait and mobility: Secondary | ICD-10-CM | POA: Diagnosis not present

## 2021-06-15 DIAGNOSIS — R531 Weakness: Secondary | ICD-10-CM | POA: Diagnosis not present

## 2021-06-16 ENCOUNTER — Ambulatory Visit (INDEPENDENT_AMBULATORY_CARE_PROVIDER_SITE_OTHER): Payer: BC Managed Care – PPO | Admitting: Otolaryngology

## 2021-06-21 ENCOUNTER — Ambulatory Visit (INDEPENDENT_AMBULATORY_CARE_PROVIDER_SITE_OTHER): Payer: BC Managed Care – PPO | Admitting: Otolaryngology

## 2021-06-24 DIAGNOSIS — M25551 Pain in right hip: Secondary | ICD-10-CM | POA: Diagnosis not present

## 2021-06-24 DIAGNOSIS — M1611 Unilateral primary osteoarthritis, right hip: Secondary | ICD-10-CM | POA: Diagnosis not present

## 2021-06-24 DIAGNOSIS — R2689 Other abnormalities of gait and mobility: Secondary | ICD-10-CM | POA: Diagnosis not present

## 2021-06-24 DIAGNOSIS — R531 Weakness: Secondary | ICD-10-CM | POA: Diagnosis not present

## 2021-06-28 ENCOUNTER — Other Ambulatory Visit: Payer: Self-pay

## 2021-06-28 ENCOUNTER — Ambulatory Visit (INDEPENDENT_AMBULATORY_CARE_PROVIDER_SITE_OTHER): Payer: BC Managed Care – PPO | Admitting: Family Medicine

## 2021-06-28 VITALS — BP 128/60 | HR 89 | Temp 98.8°F | Resp 18 | Ht 67.0 in | Wt 156.8 lb

## 2021-06-28 DIAGNOSIS — R531 Weakness: Secondary | ICD-10-CM | POA: Diagnosis not present

## 2021-06-28 DIAGNOSIS — L247 Irritant contact dermatitis due to plants, except food: Secondary | ICD-10-CM

## 2021-06-28 DIAGNOSIS — J302 Other seasonal allergic rhinitis: Secondary | ICD-10-CM

## 2021-06-28 DIAGNOSIS — H1013 Acute atopic conjunctivitis, bilateral: Secondary | ICD-10-CM

## 2021-06-28 DIAGNOSIS — M1611 Unilateral primary osteoarthritis, right hip: Secondary | ICD-10-CM | POA: Diagnosis not present

## 2021-06-28 DIAGNOSIS — J329 Chronic sinusitis, unspecified: Secondary | ICD-10-CM | POA: Diagnosis not present

## 2021-06-28 DIAGNOSIS — R2689 Other abnormalities of gait and mobility: Secondary | ICD-10-CM | POA: Diagnosis not present

## 2021-06-28 DIAGNOSIS — M25551 Pain in right hip: Secondary | ICD-10-CM | POA: Diagnosis not present

## 2021-06-28 MED ORDER — METHYLPREDNISOLONE ACETATE 80 MG/ML IJ SUSP
80.0000 mg | Freq: Once | INTRAMUSCULAR | Status: AC
  Administered 2021-06-28: 80 mg via INTRAMUSCULAR

## 2021-06-28 MED ORDER — PREDNISONE 10 MG PO TABS: ORAL_TABLET | ORAL | 0 refills | Status: DC

## 2021-06-28 MED ORDER — AZELASTINE HCL 0.05 % OP SOLN: 1.0000 [drp] | Freq: Two times a day (BID) | OPHTHALMIC | 12 refills | Status: DC

## 2021-06-28 NOTE — Patient Instructions (Signed)
Allergies, Adult ?An allergy is a condition in which the body's defense system (immune system) comes in contact with an allergen and reacts to it. An allergen is anything that causes an allergic reaction. Allergens cause the immune system to make proteins for fighting infections (antibodies). These antibodies cause cells to release chemicals called histamines that set off the symptoms of an allergic reaction. ?Allergies often affect the nasal passages (allergic rhinitis), eyes (allergic conjunctivitis), skin (atopic dermatitis), and stomach. Allergies can be mild, moderate, or severe. They cannot spread from person to person. Allergies can develop at any age and may be outgrown. ?What are the causes? ?This condition is caused by allergens. Common allergens include: ?Outdoor allergens, such as pollen, car fumes, and mold. ?Indoor allergens, such as dust, smoke, mold, and pet dander. ?Other allergens, such as foods, medicines, scents, insect bites or stings, and other skin irritants. ?What increases the risk? ?You are more likely to develop this condition if you have: ?Family members with allergies. ?Family members who have any condition that may be caused by allergens, such as asthma. This may make you more likely to have other allergies. ?What are the signs or symptoms? ?Symptoms of this condition depend on the severity of the allergy. ?Mild to moderate symptoms ?Runny nose, stuffy nose (nasal congestion), or sneezing. ?Itchy mouth, ears, or throat. ?A feeling of mucus dripping down the back of your throat (postnasal drip). ?Sore throat. ?Itchy, red, watery, or puffy eyes. ?Skin rash, or itchy, red, swollen areas of skin (hives). ?Stomach cramps or bloating. ?Severe symptoms ?Severe allergies to food, medicine, or insect bites may cause anaphylaxis, which can be life-threatening. Symptoms include: ?A red (flushed) face. ?Wheezing or coughing. ?Swollen lips, tongue, or mouth. ?Tight or swollen throat. ?Chest pain or  tightness, or rapid heartbeat. ?Trouble breathing or shortness of breath. ?Pain in the abdomen, vomiting, or diarrhea. ?Dizziness or fainting. ?How is this diagnosed? ?This condition is diagnosed based on your symptoms, your family and medical history, and a physical exam. You may also have tests, including: ?Skin tests to see how your skin reacts to allergens that may be causing your symptoms. Tests include: ?Skin prick test. For this test, an allergen is introduced to your body through a small opening in the skin. ?Intradermal skin test. For this test, a small amount of allergen is injected under the first layer of your skin. ?Patch test. For this test, a small amount of allergen is placed on your skin. The area is covered and then checked after a few days. ?Blood tests. ?A challenge test. For this test, you will eat or breathe in a small amount of allergen to see if you have an allergic reaction. ?You may also be asked to: ?Keep a food diary. This is a record of all the foods, drinks, and symptoms you have in a day. ?Try an elimination diet. To do this: ?Remove certain foods from your diet. ?Add those foods back one by one to find out if any foods cause an allergic reaction. ?How is this treated? ?  ?Treatment for allergies depends on your symptoms. Treatment may include: ?Cold, wet cloths (cold compresses) to soothe itching and swelling. ?Eye drops or nasal sprays. ?Nasal irrigation to help clear your mucus or keep the nasal passages moist. ?A humidifier to add moisture to the air. ?Skin creams to treat rashes or itching. ?Oral antihistamines or other medicines to block the reaction or to treat inflammation. ?Diet changes to remove foods that cause allergies. ?Being exposed again   and again to tiny amounts of allergens to help you build a defense against it (tolerance). This is called immunotherapy. Examples include: ?Allergy shot. You receive an injection that contains an allergen. ?Sublingual immunotherapy. You  take a small dose of allergen under your tongue. ?Emergency injection for anaphylaxis. You give yourself a shot using a syringe (auto-injector) that contains the amount of medicine you need. Your health care provider will teach you how to give yourself an injection. ?Follow these instructions at home: ?Medicines ? ?Take or apply over-the-counter and prescription medicines only as told by your health care provider. ?Always carry your auto-injector pen if you are at risk of anaphylaxis. Give yourself an injection as told by your health care provider. ?Eating and drinking ?Follow instructions from your health care provider about eating or drinking restrictions. ?Drink enough fluid to keep your urine pale yellow. ?General instructions ?Wear a medical alert bracelet or necklace to let others know that you have had anaphylaxis before. ?Avoid known allergens whenever possible. ?Keep all follow-up visits as told by your health care provider. This is important. ?Contact a health care provider if: ?Your symptoms do not get better with treatment. ?Get help right away if: ?You have symptoms of anaphylaxis. These include: ?Swollen mouth, tongue, or throat. ?Pain or tightness in your chest. ?Trouble breathing or shortness of breath. ?Dizziness or fainting. ?Severe abdominal pain, vomiting, or diarrhea. ?These symptoms may represent a serious problem that is an emergency. Do not wait to see if the symptoms will go away. Get medical help right away. Call your local emergency services (911 in the [place] Do not drive yourself to the hospital. ?Summary ?Take or apply over-the-counter and prescription medicines only as told by your health care provider. ?Avoid known allergens when possible. ?Always carry your auto-injector pen if you are at risk of anaphylaxis. Give yourself an injection as told by your health care provider. ?Wear a medical alert bracelet or necklace to let others know that you have had anaphylaxis before. ?Anaphylaxis  is a life-threatening emergency. Get help right away. ?This information is not intended to replace advice given to you by your health care provider. Make sure you discuss any questions you have with your health care provider. ?Document Revised: 05/31/2020 Document Reviewed: 08/13/2019 ?Elsevier Patient Education ? 2022 Elsevier Inc. ? ?

## 2021-06-28 NOTE — Progress Notes (Addendum)
Subjective:   By signing my name below, I, Shehryar Baig, attest that this documentation has been prepared under the direction and in the presence of [name], DO  06/28/2021    Patient ID: Jennifer Stokes, female    DOB: October 20, 1955, 65 y.o.   MRN: [id number]  Chief Complaint  Patient presents with   Sinus Problem    Pt states having sinus problems since June. Pt states red eyes and nasal drip.    Poison Ivy    Pt states she thinks she has poison ivy on her legs. Pt states having itching legs.     HPI Patient is in today for a office visit. She complains of her redness in her eyes and a sinus infection. Her previous ENT specialist retired and she is requesting to see another ENT specialist. She denies feeling pressure in her ears at this time.  She also complains of itchiness in her lower legs after picking flowers. She has applied cortisol cream to her lower legs and found mild relief. She is requesting to see an allergy specialist to find if something else caused her itching. She is requesting a steroid injection to manage her symptoms. She thinks that she mistakenly reports being allergic to prednisone when she meant to say hydrocodone.  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 She continue seeing a physical therapist at this time.    Past Medical History:  Diagnosis Date   ADD (attention deficit disorder)     Past Surgical History:  Procedure Laterality Date   COSMETIC SURGERY      Family History  Problem Relation Age of Onset   Heart disease Mother    Heart disease Father    Bipolar disorder Son    Diabetes Neg Hx    Hyperlipidemia Neg Hx    Hypertension Neg Hx    Stroke Neg Hx     Social History   Socioeconomic History   Marital status: Married    Spouse name: Not on file   Number of children: Not on file   Years of education: Not on file   Highest education level: Not on file   Occupational History   Occupation: Forensic scientist  Tobacco Use   Smoking status: Never   Smokeless tobacco: Never  Substance and Sexual Activity   Alcohol use: No   Drug use: No   Sexual activity: Yes    Partners: Male  Other Topics Concern   Not on file  Social History Narrative   Not on file   Social Determinants of Health   Financial Resource Strain: Not on file  Food Insecurity: Not on file  Transportation Needs: Not on file  Physical Activity: Not on file  Stress: Not on file  Social Connections: Not on file  Intimate Partner Violence: Not on file    Outpatient Medications Prior to Visit  Medication Sig Dispense Refill   Methylphenidate HCl ER, PM, (JORNAY PM) 100 MG CP24 Jornay PM 100 mg capsule,delayed release,extended release sprinkle  TAKE 1 CAPSULE BY MOUTH AT BEDTIME     sertraline (ZOLOFT) 25 MG tablet TAKE 1 BY MOUTH DAILY 90 tablet 3   amoxicillin-clavulanate (AUGMENTIN) 875-125 MG tablet Take 1 tablet by mouth 2 (two) times daily. (Patient not taking: Reported on 06/28/2021) 14 tablet 0   No facility-administered medications prior to visit.    Allergies  Allergen Reactions   Codeine  Prednisone     Review of Systems  Constitutional:  Negative for fever and malaise/fatigue.  HENT:  Positive for congestion.        (-)Ear pressure   Eyes:  Positive for redness (bilateral eyes). Negative for blurred vision.  Respiratory:  Negative for shortness of breath.   Cardiovascular:  Negative for chest pain, palpitations and leg swelling.  Gastrointestinal:  Negative for abdominal pain, blood in stool and nausea.  Genitourinary:  Negative for dysuria and frequency.  Musculoskeletal:  Negative for falls.  Skin:  Positive for itching (lower bilateral legs). Negative for rash.  Neurological:  Negative for dizziness, loss of consciousness and headaches.  Endo/Heme/Allergies:  Negative for environmental allergies.  Psychiatric/Behavioral:  Negative for  depression. The patient is not nervous/anxious.       Objective:    Physical Exam Vitals and nursing note reviewed.  Constitutional:      General: She is not in acute distress.    Appearance: Normal appearance. She is not ill-appearing.  HENT:     Head: Normocephalic and atraumatic.     Right Ear: External ear normal.     Left Ear: External ear normal.  Eyes:     Extraocular Movements: Extraocular movements intact.     Pupils: Pupils are equal, round, and reactive to light.  Cardiovascular:     Rate and Rhythm: Normal rate and regular rhythm.     Heart sounds: Normal heart sounds. No murmur heard.   No gallop.  Pulmonary:     Effort: Pulmonary effort is normal. No respiratory distress.     Breath sounds: Normal breath sounds. No wheezing or rales.  Skin:    General: Skin is warm and dry.     Comments: Red papules on lower legs  Neurological:     Mental Status: She is alert and oriented to person, place, and time.  Psychiatric:        Behavior: Behavior normal.        Judgment: Judgment normal.    BP 128/60 (BP Location: Right Arm, Patient Position: Sitting, Cuff Size: Normal)   Pulse 89   Temp 98.8 F (37.1 C) (Oral)   Resp 18   Ht 5\' 7"  (1.702 m)   Wt 156 lb 12.8 oz (71.1 kg)   SpO2 97%   BMI 24.56 kg/m  Wt Readings from Last 3 Encounters:  06/28/21 156 lb 12.8 oz (71.1 kg)  12/14/20 152 lb 3.2 oz (69 kg)  05/01/19 145 lb (65.8 kg)    Diabetic Foot Exam - Simple   No data filed    Lab Results  Component Value Date   WBC 5.0 12/14/2020   HGB 14.0 12/14/2020   HCT 41.7 12/14/2020   PLT 265.0 12/14/2020   GLUCOSE 103 (H) 12/14/2020   CHOL 263 (H) 12/14/2020   TRIG 75.0 12/14/2020   HDL 109.40 12/14/2020   LDLDIRECT 88.5 10/31/2012   LDLCALC 138 (H) 12/14/2020   ALT 23 12/14/2020   AST 23 12/14/2020   NA 137 12/14/2020   K 4.4 12/14/2020   CL 101 12/14/2020   CREATININE 0.84 12/14/2020   BUN 17 12/14/2020   CO2 30 12/14/2020   TSH 1.57 12/14/2020    HGBA1C 5.4 10/03/2011   MICROALBUR 0.3 10/31/2012    Lab Results  Component Value Date   TSH 1.57 12/14/2020   Lab Results  Component Value Date   WBC 5.0 12/14/2020   HGB 14.0 12/14/2020   HCT 41.7 12/14/2020   MCV 93.6  12/14/2020   PLT 265.0 12/14/2020   Lab Results  Component Value Date   NA 137 12/14/2020   K 4.4 12/14/2020   CO2 30 12/14/2020   GLUCOSE 103 (H) 12/14/2020   BUN 17 12/14/2020   CREATININE 0.84 12/14/2020   BILITOT 0.4 12/14/2020   ALKPHOS 103 12/14/2020   AST 23 12/14/2020   ALT 23 12/14/2020   PROT 6.8 12/14/2020   ALBUMIN 4.5 12/14/2020   CALCIUM 9.7 12/14/2020   GFR 73.78 12/14/2020   Lab Results  Component Value Date   CHOL 263 (H) 12/14/2020   Lab Results  Component Value Date   HDL 109.40 12/14/2020   Lab Results  Component Value Date   LDLCALC 138 (H) 12/14/2020   Lab Results  Component Value Date   TRIG 75.0 12/14/2020   Lab Results  Component Value Date   CHOLHDL 2 12/14/2020   Lab Results  Component Value Date   HGBA1C 5.4 10/03/2011       Assessment & Plan:   Problem List Items Addressed This Visit       Unprioritized   Allergic conjunctivitis of both eyes - Primary   Relevant Medications   azelastine (OPTIVAR) 0.05 % ophthalmic solution   Irritant contact dermatitis due to plants, except food    Poison ivy b/o legs  Depo medrol and pred taper  Take benadryl prn for itching       Recurrent sinusitis    Her ent retired --- she is requesting a new referral       Relevant Orders   Ambulatory referral to ENT   Seasonal allergies    Pt requesting an allergy referral  con't antihistamine and steroid nasal spray       Relevant Orders   Ambulatory referral to Allergy     Meds ordered this encounter  Medications   azelastine (OPTIVAR) 0.05 % ophthalmic solution    Sig: Place 1 drop into both eyes 2 (two) times daily.    Dispense:  6 mL    Refill:  12   DISCONTD: predniSONE (DELTASONE) 10 MG tablet     Sig: TAKE 3 TABLETS PO QD FOR 3 DAYS THEN TAKE 2 TABLETS PO QD FOR 3 DAYS THEN TAKE 1 TABLET PO QD FOR 3 DAYS THEN TAKE 1/2 TAB PO QD FOR 3 DAYS    Dispense:  20 tablet    Refill:  0   methylPREDNISolone acetate (DEPO-MEDROL) injection 80 mg    I, Lowne Chase, [employer], DO, personally preformed the services described in this documentation.  All medical record entries made by the scribe were at my direction and in my presence.  I have reviewed the chart and discharge instructions (if applicable) and agree that the record reflects my personal performance and is accurate and complete. 06/28/2021   I,Shehryar Baig,acting as a Neurosurgeon for [employer], DO.,have documented all relevant documentation on the behalf of [name], DO,as directed by  [name], DO while in the presence of [name], DO.   [name], DO

## 2021-06-29 ENCOUNTER — Encounter: Payer: Self-pay | Admitting: Family Medicine

## 2021-06-29 DIAGNOSIS — J302 Other seasonal allergic rhinitis: Secondary | ICD-10-CM | POA: Insufficient documentation

## 2021-06-29 DIAGNOSIS — H1013 Acute atopic conjunctivitis, bilateral: Secondary | ICD-10-CM | POA: Insufficient documentation

## 2021-06-29 DIAGNOSIS — J329 Chronic sinusitis, unspecified: Secondary | ICD-10-CM | POA: Insufficient documentation

## 2021-06-29 DIAGNOSIS — L247 Irritant contact dermatitis due to plants, except food: Secondary | ICD-10-CM | POA: Insufficient documentation

## 2021-06-29 NOTE — Assessment & Plan Note (Signed)
Her ent retired --- she is requesting a new referral

## 2021-06-29 NOTE — Assessment & Plan Note (Signed)
Poison ivy b/o legs  Depo medrol and pred taper  Take benadryl prn for itching

## 2021-06-29 NOTE — Assessment & Plan Note (Signed)
Pt requesting an allergy referral  con't antihistamine and steroid nasal spray

## 2021-07-05 DIAGNOSIS — M1611 Unilateral primary osteoarthritis, right hip: Secondary | ICD-10-CM | POA: Diagnosis not present

## 2021-07-05 DIAGNOSIS — R531 Weakness: Secondary | ICD-10-CM | POA: Diagnosis not present

## 2021-07-05 DIAGNOSIS — M25551 Pain in right hip: Secondary | ICD-10-CM | POA: Diagnosis not present

## 2021-07-05 DIAGNOSIS — R2689 Other abnormalities of gait and mobility: Secondary | ICD-10-CM | POA: Diagnosis not present

## 2021-07-12 DIAGNOSIS — M1611 Unilateral primary osteoarthritis, right hip: Secondary | ICD-10-CM | POA: Diagnosis not present

## 2021-07-12 DIAGNOSIS — M25551 Pain in right hip: Secondary | ICD-10-CM | POA: Diagnosis not present

## 2021-07-12 DIAGNOSIS — R531 Weakness: Secondary | ICD-10-CM | POA: Diagnosis not present

## 2021-07-12 DIAGNOSIS — R2689 Other abnormalities of gait and mobility: Secondary | ICD-10-CM | POA: Diagnosis not present

## 2021-07-18 DIAGNOSIS — Z20828 Contact with and (suspected) exposure to other viral communicable diseases: Secondary | ICD-10-CM | POA: Diagnosis not present

## 2021-07-19 DIAGNOSIS — M25551 Pain in right hip: Secondary | ICD-10-CM | POA: Diagnosis not present

## 2021-07-19 DIAGNOSIS — M2689 Other dentofacial anomalies: Secondary | ICD-10-CM | POA: Diagnosis not present

## 2021-07-19 DIAGNOSIS — M1611 Unilateral primary osteoarthritis, right hip: Secondary | ICD-10-CM | POA: Diagnosis not present

## 2021-07-19 DIAGNOSIS — R531 Weakness: Secondary | ICD-10-CM | POA: Diagnosis not present

## 2021-07-26 DIAGNOSIS — R2689 Other abnormalities of gait and mobility: Secondary | ICD-10-CM | POA: Diagnosis not present

## 2021-07-26 DIAGNOSIS — M25551 Pain in right hip: Secondary | ICD-10-CM | POA: Diagnosis not present

## 2021-07-26 DIAGNOSIS — M1611 Unilateral primary osteoarthritis, right hip: Secondary | ICD-10-CM | POA: Diagnosis not present

## 2021-07-26 DIAGNOSIS — R531 Weakness: Secondary | ICD-10-CM | POA: Diagnosis not present

## 2021-08-01 DIAGNOSIS — M1611 Unilateral primary osteoarthritis, right hip: Secondary | ICD-10-CM | POA: Diagnosis not present

## 2021-08-01 DIAGNOSIS — R531 Weakness: Secondary | ICD-10-CM | POA: Diagnosis not present

## 2021-08-01 DIAGNOSIS — R2689 Other abnormalities of gait and mobility: Secondary | ICD-10-CM | POA: Diagnosis not present

## 2021-08-10 DIAGNOSIS — M25551 Pain in right hip: Secondary | ICD-10-CM | POA: Diagnosis not present

## 2021-08-10 DIAGNOSIS — M1611 Unilateral primary osteoarthritis, right hip: Secondary | ICD-10-CM | POA: Diagnosis not present

## 2021-08-10 DIAGNOSIS — M25662 Stiffness of left knee, not elsewhere classified: Secondary | ICD-10-CM | POA: Diagnosis not present

## 2021-08-10 DIAGNOSIS — M25562 Pain in left knee: Secondary | ICD-10-CM | POA: Diagnosis not present

## 2021-08-30 DIAGNOSIS — R531 Weakness: Secondary | ICD-10-CM | POA: Diagnosis not present

## 2021-08-30 DIAGNOSIS — M1611 Unilateral primary osteoarthritis, right hip: Secondary | ICD-10-CM | POA: Diagnosis not present

## 2021-08-30 DIAGNOSIS — M25551 Pain in right hip: Secondary | ICD-10-CM | POA: Diagnosis not present

## 2021-08-30 DIAGNOSIS — Z96641 Presence of right artificial hip joint: Secondary | ICD-10-CM | POA: Diagnosis not present

## 2021-09-01 ENCOUNTER — Other Ambulatory Visit: Payer: Self-pay

## 2021-09-01 ENCOUNTER — Encounter: Payer: Self-pay | Admitting: Allergy & Immunology

## 2021-09-01 ENCOUNTER — Ambulatory Visit (INDEPENDENT_AMBULATORY_CARE_PROVIDER_SITE_OTHER): Payer: BC Managed Care – PPO | Admitting: Allergy & Immunology

## 2021-09-01 VITALS — BP 124/80 | HR 98 | Temp 97.5°F | Resp 24 | Ht 66.4 in | Wt 156.8 lb

## 2021-09-01 DIAGNOSIS — H109 Unspecified conjunctivitis: Secondary | ICD-10-CM

## 2021-09-01 DIAGNOSIS — J310 Chronic rhinitis: Secondary | ICD-10-CM

## 2021-09-01 NOTE — Patient Instructions (Addendum)
1. Chronic rhinitis - We are going to get blood work to see what your environmental allergens - We will call you in 1-2 weeks with the results of the testing. - In the meantime, try Allegra 180mg  tablet once daily (passed into the brain the LEAST amount,m so fewer side effects).  - Start Ryaltris one spray per nostril daily (contains a nasal steroid AND a nasal antihistamine together). - We can discuss additional treatment plans once we get the lab results back  2. Conjunctivitis of both eyes - Start ketotifen eye drops one drop per eye three times daily for one week, two times daily for one week, and then once weekly for one week. - Afterwards, you can wean to as needed thereafter.   3. Return in about 6 weeks (around 10/13/2021).    Please inform 10/15/2021 of any Emergency Department visits, hospitalizations, or changes in symptoms. Call us before going to the ED for breathing or allergy symptoms since we might be able to fit you in for a sick visit. Feel free to contact us anytime with any questions, problems, or concerns.  It was a pleasure to meet you today!  Websites that have reliable patient information: 1. American Academy of Asthma, Allergy, and Immunology: www.aaaai.org 2. Food Allergy Research and Education (FARE): foodallergy.org 3. Mothers of Asthmatics: http://www.asthmacommunitynetwork.org 4. American College of Allergy, Asthma, and Immunology: www.acaai.org   COVID-19 Vaccine Information can be found at: [place] For questions related to vaccine distribution or appointments, please email [email or web address] or call [phone].   We realize that you might be concerned about having an allergic reaction to the COVID19 vaccines. To help with that concern, WE ARE OFFERING THE COVID19 VACCINES IN OUR OFFICE! Ask the front desk for dates!     "Like" [phone] on Facebook and Instagram for our latest updates!       A healthy democracy works best when [place] participate! Make sure you are registered to vote! If you have moved or changed any of your contact information, you will need to get this updated before voting!  In some cases, you MAY be able to register to vote online: [employer]

## 2021-09-01 NOTE — Progress Notes (Signed)
NEW PATIENT  Date of Service/Encounter:  09/01/21  Consult requested by: Jennifer Schultz, DO   Assessment:   Chronic rhinitis -  getting blood work since histamine was non reactive today  Conjunctivitis of both eyes - starting ketotifen eye drops  Plan/Recommendations:   1. Chronic rhinitis - We are going to get blood work to see what your environmental allergens - We will call you in 1-2 weeks with the results of the testing. - In the meantime, try Allegra 180mg  tablet once daily (passed into the brain the LEAST amount,m so fewer side effects).  - Start Ryaltris one spray per nostril daily (contains a nasal steroid AND a nasal antihistamine together). - We can discuss additional treatment plans once we get the lab results back  2. Conjunctivitis of both eyes - Start ketotifen eye drops one drop per eye three times daily for one week, two times daily for one week, and then once weekly for one week. - Afterwards, you can wean to as needed thereafter.   3. Return in about 6 weeks (around 10/13/2021).     This note in its entirety was forwarded to the Provider who requested this consultation.  Subjective:   Jennifer Stokes is a 65 y.o. female presenting today for evaluation of  Chief Complaint  Patient presents with   Allergic Rhinitis     Eyes get red, nose always running, but don't know what she is allergic to. Had sinus infection and symptoms were red eyes and teeth hurting. Tried multiple allergy medications and has gotten worse since July.   Allergy Testing    Jennifer Stokes has a history of the following: Patient Active Problem List   Diagnosis Date Noted   Allergic conjunctivitis of both eyes 06/29/2021   Irritant contact dermatitis due to plants, except food 06/29/2021   Seasonal allergies 06/29/2021   Recurrent sinusitis 06/29/2021   Right hip pain 12/14/2020   Exposure to COVID-19 virus 12/14/2020   Preventative health care 12/14/2020   Preop examination  12/14/2020   Heel pain 07/09/2018   Contusion, arm, upper 06/04/2018   Degenerative arthritis of left knee 05/08/2017   Residual foreign body in soft tissue 05/14/2016   HEAD TRAUMA 08/19/2009   DEPRESSION 12/18/2007    History obtained from: chart review and patient.  02/17/2008 was referred by Jennifer Stokes, Jennifer Button, DO.     Jennifer is a 65 y.o. female presenting for an evaluation of congestion . She took an antihistamine overnight. She took Tylenol PM which has an antihistamine.   She grew up in [place]. She has mostly not had sinus issues growing up. She has never been tested in the past. Her daughter has been tested. One daughter is in [place], [place], and [place].   Her daughter recently had a baby in [place].  Allergic Rhinitis Symptom History: She has rhinorrhea and bloodshot eyes. In June she had a sinus infection. She had major tooth pain. The dentist figured it out and put her on azithromycin x2 which never really cleared up. She has had eye drops from her PCP. She went to see the ophthalmologist . She has been having these symptoms for several years but it seems to have been worse over the last 6-12 months. She had some work done on her house and she had to move a lot of furniture and the dust.   She has tried a variety of eye drops. Nothing seems to work  at all. She will wrear her contacts when she plays tennis and this helps. She was going to do a video and she "looked like [she] was on drugs". It seems to get quite red and it itches. She has been to the eye doctor and they have not been concerned with this at all.   She is not a fan of nasal sprays. She has been using olapatadine with minimal improvement.   Her mother and daughters have major sinus issues.   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 She had some issues with the air quality and she now has a variety of air filters.   Otherwise, there is no history of  other atopic diseases, including asthma, food allergies, drug allergies, stinging insect allergies, eczema, urticaria, or contact dermatitis. There is no significant infectious history. Vaccinations are up to date.    Past Medical History: Patient Active Problem List   Diagnosis Date Noted   Allergic conjunctivitis of both eyes 06/29/2021   Irritant contact dermatitis due to plants, except food 06/29/2021   Seasonal allergies 06/29/2021   Recurrent sinusitis 06/29/2021   Right hip pain 12/14/2020   Exposure to COVID-19 virus 12/14/2020   Preventative health care 12/14/2020   Preop examination 12/14/2020   Heel pain 07/09/2018   Contusion, arm, upper 06/04/2018   Degenerative arthritis of left knee 05/08/2017   Residual foreign body in soft tissue 05/14/2016   HEAD TRAUMA 08/19/2009   DEPRESSION 12/18/2007    Medication List:  Allergies as of 09/01/2021       Reactions   Codeine    Prednisone         Medication List        Accurate as of September 01, 2021 12:14 PM. If you have any questions, ask your nurse or doctor.          azelastine 0.05 % ophthalmic solution Commonly known as: OPTIVAR Place 1 drop into both eyes 2 (two) times daily.   Jornay PM 100 MG Cp24 Generic drug: Methylphenidate HCl ER (PM) Jornay PM 100 mg capsule,delayed release,extended release sprinkle  TAKE 1 CAPSULE BY MOUTH AT BEDTIME   sertraline 25 MG tablet Commonly known as: ZOLOFT TAKE 1 BY MOUTH DAILY        Birth History: non-contributory  Developmental History:  non-contributory  Past Surgical History: Past Surgical History:  Procedure Laterality Date   COSMETIC SURGERY     TOTAL HIP ARTHROPLASTY Right 12/2020     Family History: Family History  Problem Relation Age of Onset   Eczema Mother    Allergic rhinitis Mother    Heart disease Mother    Allergic rhinitis Father    Heart disease Father    Bipolar disorder Son    Eczema Daughter    Diabetes Neg Hx     Hyperlipidemia Neg Hx    Hypertension Neg Hx    Stroke Neg Hx      Social History: Jennifer Stokes lives at home with her family. They live in a house that was built in 1912. Thre is wood with carpeting in the main living areas. There is carpeting in the bedroom. There is gas heating and central cooling. There is a bulldog inside of the home. There are dust mite coverings on the bedding. There is no tobacco exposure in the home. 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    Review of Systems  Constitutional: Negative.  Negative for chills, fever, malaise/fatigue and  weight loss.  HENT: Negative.  Negative for congestion, ear discharge, ear pain and sinus pain.   Eyes:  Negative for pain, discharge and redness.  Respiratory:  Negative for cough, sputum production, shortness of breath and wheezing.   Cardiovascular: Negative.  Negative for chest pain and palpitations.  Gastrointestinal:  Negative for abdominal pain, constipation, diarrhea, heartburn, nausea and vomiting.  Skin: Negative.  Negative for itching and rash.  Neurological:  Negative for dizziness and headaches.  Endo/Heme/Allergies:  Negative for environmental allergies. Does not bruise/bleed easily.      Objective:   Blood pressure 124/80, pulse 98, temperature (!) 97.5 F (36.4 C), temperature source Temporal, resp. rate (!) 24, height 5' 6.4" (1.687 m), weight 156 lb 12.8 oz (71.1 kg), SpO2 100 %. Body mass index is 25 kg/m.   Physical Exam:   Physical Exam Vitals reviewed.  Constitutional:      Appearance: She is well-developed.  HENT:     Head: Normocephalic and atraumatic.     Right Ear: Tympanic membrane, ear canal and external ear normal. No drainage, swelling or tenderness. Tympanic membrane is not injected, scarred, erythematous, retracted or bulging.     Left Ear: Tympanic membrane, ear canal and external ear normal. No drainage, swelling or tenderness. Tympanic membrane is not injected, scarred,  erythematous, retracted or bulging.     Nose: No nasal deformity, septal deviation, mucosal edema or rhinorrhea.     Right Turbinates: Enlarged, swollen and pale.     Left Turbinates: Enlarged, swollen and pale.     Right Sinus: No maxillary sinus tenderness or frontal sinus tenderness.     Left Sinus: No maxillary sinus tenderness or frontal sinus tenderness.     Mouth/Throat:     Mouth: Mucous membranes are not pale and not dry.     Pharynx: Uvula midline.  Eyes:     General:        Right eye: No discharge.        Left eye: No discharge.     Conjunctiva/sclera: Conjunctivae normal.     Right eye: Right conjunctiva is not injected. No chemosis.    Left eye: Left conjunctiva is not injected. No chemosis.    Pupils: Pupils are equal, round, and reactive to light.  Cardiovascular:     Rate and Rhythm: Normal rate and regular rhythm.     Heart sounds: Normal heart sounds.  Pulmonary:     Effort: Pulmonary effort is normal. No tachypnea, accessory muscle usage or respiratory distress.     Breath sounds: Normal breath sounds. No wheezing, rhonchi or rales.  Chest:     Chest wall: No tenderness.  Abdominal:     Tenderness: There is no abdominal tenderness. There is no guarding or rebound.  Lymphadenopathy:     Head:     Right side of head: No submandibular, tonsillar or occipital adenopathy.     Left side of head: No submandibular, tonsillar or occipital adenopathy.     Cervical: No cervical adenopathy.  Skin:    Coloration: Skin is not pale.     Findings: No abrasion, erythema, petechiae or rash. Rash is not papular, urticarial or vesicular.  Neurological:     Mental Status: She is alert.  Psychiatric:        Behavior: Behavior is cooperative.     Diagnostic studies: none (histamine was non-reactive)           Malachi Bonds, MD [facility]

## 2021-09-05 DIAGNOSIS — M25551 Pain in right hip: Secondary | ICD-10-CM | POA: Diagnosis not present

## 2021-09-05 DIAGNOSIS — R531 Weakness: Secondary | ICD-10-CM | POA: Diagnosis not present

## 2021-09-05 DIAGNOSIS — M1611 Unilateral primary osteoarthritis, right hip: Secondary | ICD-10-CM | POA: Diagnosis not present

## 2021-09-05 DIAGNOSIS — Z96641 Presence of right artificial hip joint: Secondary | ICD-10-CM | POA: Diagnosis not present

## 2021-09-07 LAB — ALLERGENS W/COMP RFLX AREA 2
Alternaria Alternata IgE: <0.1 kU/L
Aspergillus Fumigatus IgE: <0.1 kU/L
Bermuda Grass IgE: <0.1 kU/L
Cedar, Mountain IgE: <0.1 kU/L
Cladosporium Herbarum IgE: <0.1 kU/L
Cockroach, German IgE: <0.1 kU/L
Common Silver Birch IgE: <0.1 kU/L
Cottonwood IgE: <0.1 kU/L
D Farinae IgE: <0.1 kU/L
D Pteronyssinus IgE: <0.1 kU/L
E001-IgE Cat Dander: <0.1 kU/L
E005-IgE Dog Dander: <0.1 kU/L
Elm, American IgE: <0.1 kU/L
IgE (Immunoglobulin E), Serum: 4 IU/mL — ABNORMAL LOW (ref 6–495)
Johnson Grass IgE: <0.1 kU/L
Maple/Box Elder IgE: <0.1 kU/L
Mouse Urine IgE: <0.1 kU/L
Oak, White IgE: <0.1 kU/L
Pecan, Hickory IgE: <0.1 kU/L
Penicillium Chrysogen IgE: <0.1 kU/L
Pigweed, Rough IgE: <0.1 kU/L
Ragweed, Short IgE: <0.1 kU/L
Sheep Sorrel IgE Qn: <0.1 kU/L
Timothy Grass IgE: <0.1 kU/L
White Mulberry IgE: <0.1 kU/L

## 2021-09-20 DIAGNOSIS — R531 Weakness: Secondary | ICD-10-CM | POA: Diagnosis not present

## 2021-09-20 DIAGNOSIS — Z96641 Presence of right artificial hip joint: Secondary | ICD-10-CM | POA: Diagnosis not present

## 2021-09-20 DIAGNOSIS — M25551 Pain in right hip: Secondary | ICD-10-CM | POA: Diagnosis not present

## 2021-09-20 DIAGNOSIS — M1611 Unilateral primary osteoarthritis, right hip: Secondary | ICD-10-CM | POA: Diagnosis not present

## 2021-09-21 DIAGNOSIS — H0012 Chalazion right lower eyelid: Secondary | ICD-10-CM | POA: Diagnosis not present

## 2021-09-29 DIAGNOSIS — F411 Generalized anxiety disorder: Secondary | ICD-10-CM | POA: Diagnosis not present

## 2021-09-29 DIAGNOSIS — F900 Attention-deficit hyperactivity disorder, predominantly inattentive type: Secondary | ICD-10-CM | POA: Diagnosis not present

## 2021-10-03 DIAGNOSIS — Z96641 Presence of right artificial hip joint: Secondary | ICD-10-CM | POA: Diagnosis not present

## 2021-10-03 DIAGNOSIS — M1611 Unilateral primary osteoarthritis, right hip: Secondary | ICD-10-CM | POA: Diagnosis not present

## 2021-10-03 DIAGNOSIS — R531 Weakness: Secondary | ICD-10-CM | POA: Diagnosis not present

## 2021-10-03 DIAGNOSIS — M25551 Pain in right hip: Secondary | ICD-10-CM | POA: Diagnosis not present

## 2021-10-04 ENCOUNTER — Ambulatory Visit (INDEPENDENT_AMBULATORY_CARE_PROVIDER_SITE_OTHER): Payer: BC Managed Care – PPO | Admitting: Allergy & Immunology

## 2021-10-04 ENCOUNTER — Encounter: Payer: Self-pay | Admitting: Allergy & Immunology

## 2021-10-04 ENCOUNTER — Other Ambulatory Visit: Payer: Self-pay

## 2021-10-04 VITALS — BP 114/60 | HR 98 | Temp 97.3°F | Resp 20 | Ht 67.0 in | Wt 156.2 lb

## 2021-10-04 DIAGNOSIS — J310 Chronic rhinitis: Secondary | ICD-10-CM

## 2021-10-04 DIAGNOSIS — R0981 Nasal congestion: Secondary | ICD-10-CM

## 2021-10-04 MED ORDER — AMOXICILLIN-POT CLAVULANATE 875-125 MG PO TABS: 1.0000 | ORAL_TABLET | Freq: Two times a day (BID) | ORAL | 0 refills | Status: AC

## 2021-10-04 NOTE — Progress Notes (Addendum)
FOLLOW UP  Date of Service/Encounter:  10/04/21   Assessment:   Chronic rhinitis (dust mites, cockroach) - with minimal reactivities   Conjunctivitis of both eyes - on ketotifen eye drops  Acute sinusitis - partially treated with azithromycin (adding Augmentin twice daily for 10 days)    Plan/Recommendations:   1. Chronic rhinitis - Testing today showed: dust mites and cockroach. - Copy of test results provided.  - Avoidance measures provided. - Continue with: Ryaltris (olopatadine/mometasone) two sprays per nostril 1-2 times daily as needed and Allegra (fexofenadine) 180mg  tablet once daily - Add on Augmentin 875mg  twice daily for ten days. - This should cover the bacteria involved with the chalazion and the sinus infection. - We are going to refer you to ENT.   2. Return in about 6 months (around 04/04/2022).   Subjective:   Jennifer Stokes is a 65 y.o. female presenting today for follow up of  Chief Complaint  Patient presents with   Allergic Rhinitis     Sinus infection right after last visit and still has it.    Jennifer Stokes has a history of the following: Patient Active Problem List   Diagnosis Date Noted   Allergic conjunctivitis of both eyes 06/29/2021   Irritant contact dermatitis due to plants, except food 06/29/2021   Seasonal allergies 06/29/2021   Recurrent sinusitis 06/29/2021   Right hip pain 12/14/2020   Exposure to COVID-19 virus 12/14/2020   Preventative health care 12/14/2020   Preop examination 12/14/2020   Heel pain 07/09/2018   Contusion, arm, upper 06/04/2018   Degenerative arthritis of left knee 05/08/2017   Residual foreign body in soft tissue 05/14/2016   HEAD TRAUMA 08/19/2009   DEPRESSION 12/18/2007    History obtained from: chart review and patient.  Jennifer Stokes is a 65 y.o. female presenting for a follow up visit.  She was last seen in November 2022.  At that time, we got blood work to check on her environmental allergies.  We recommended  trying Allegra 1 tablet daily.  We started Ryaltris 1 spray per nostril daily.  For her conjunctivitis, we started ketotifen eyedrops.  Environmental allergy testing via the blood was negative with an IgE of 4.  Since last visit, she has mostly done well.  It seems that she did the Ryaltris and she did okay with.  Is unclear why she stopped it.  Regardless, a week after her appointment with [facility], she was diagnosed with a sinus infection.  She also had a chalazion on her eye.  She was to see a dentist for an unrelated reason and was started on azithromycin.  She finished this and things were getting a little bit better.  Then she traveled to December 2022 and things flared again.  She has done some warm compresses for her chalazion.  It is still present on the right side.  She has been off of antihistamines for this appointment today.  She is asking about 3 allergies today contributing to her symptoms as well.  I think that highly unlikely, but she was rather persuasive so we did skin testing to the most common foods.  Otherwise, there have been no changes to her past medical history, surgical history, family history, or social history.    Review of Systems  Constitutional: Negative.  Negative for chills, fever, malaise/fatigue and weight loss.  HENT:  Positive for congestion. Negative for ear discharge, ear pain and sinus pain.        Positive for postnasal  drip.  Positive for throat clearing.  Eyes:  Negative for pain, discharge and redness.  Respiratory:  Negative for cough, sputum production, shortness of breath and wheezing.   Cardiovascular: Negative.  Negative for chest pain and palpitations.  Gastrointestinal:  Negative for abdominal pain, constipation, diarrhea, heartburn, nausea and vomiting.  Skin: Negative.  Negative for itching and rash.  Neurological:  Negative for dizziness and headaches.  Endo/Heme/Allergies:  Negative for environmental allergies. Does not bruise/bleed easily.       Objective:   Blood pressure 114/60, pulse 98, temperature (!) 97.3 F (36.3 C), temperature source Temporal, resp. rate 20, height 5\' 7"  (1.702 m), weight 156 lb 3.2 oz (70.9 kg), SpO2 100 %. Body mass index is 24.46 kg/m.   Physical Exam:  Physical Exam Vitals reviewed.  Constitutional:      Appearance: She is well-developed.     Comments: Very talkative.  HENT:     Head: Normocephalic and atraumatic.     Right Ear: Tympanic membrane, ear canal and external ear normal.     Left Ear: Tympanic membrane, ear canal and external ear normal.     Nose: No nasal deformity, septal deviation, mucosal edema or rhinorrhea.     Right Turbinates: Enlarged, swollen and pale.     Left Turbinates: Enlarged, swollen and pale.     Right Sinus: No maxillary sinus tenderness or frontal sinus tenderness.     Left Sinus: No maxillary sinus tenderness or frontal sinus tenderness.     Mouth/Throat:     Mouth: Mucous membranes are not pale and not dry.     Pharynx: Uvula midline.  Eyes:     General: Lids are normal. No allergic shiner.       Right eye: No discharge.        Left eye: No discharge.     Conjunctiva/sclera: Conjunctivae normal.     Right eye: Right conjunctiva is not injected. No chemosis.    Left eye: Left conjunctiva is not injected. No chemosis.    Pupils: Pupils are equal, round, and reactive to light.  Cardiovascular:     Rate and Rhythm: Normal rate and regular rhythm.     Heart sounds: Normal heart sounds.  Pulmonary:     Effort: Pulmonary effort is normal. No tachypnea, accessory muscle usage or respiratory distress.     Breath sounds: Normal breath sounds. No wheezing, rhonchi or rales.     Comments: Moving air well in all lung fields.  No increased work of breathing. Chest:     Chest wall: No tenderness.  Lymphadenopathy:     Cervical: No cervical adenopathy.  Skin:    General: Skin is warm.     Capillary Refill: Capillary refill takes less than 2 seconds.      Coloration: Skin is not pale.     Findings: No abrasion, erythema, petechiae or rash. Rash is not papular, urticarial or vesicular.     Comments: No eczematous or urticarial lesions noted.  Neurological:     Mental Status: She is alert.  Psychiatric:        Behavior: Behavior is cooperative.     Diagnostic studies: none    Allergy Studies:     Food Perc - 10/04/21 1741       Test Information   Time Antigen Placed 1745    Allergen Manufacturer 10/06/21    Location [facility]    Number of allergen test 10      Food   1. Peanut Negative  2. Soybean food Negative    3. Wheat, whole Negative    4. Sesame Negative    5. Milk, cow Negative    6. Egg White, chicken Negative    7. Casein Negative    8. Shellfish mix Negative    9. Fish mix Negative    10. Cashew Negative             Intradermal - 10/04/21 1741     Time Antigen Placed 1720    Location [facility]    Number of Test 15    Intradermal Select    Control Negative    [place] Negative    [place] Negative    7 Grass Negative    Ragweed mix Negative    Weed mix Negative    Tree mix Negative    Mold 1 Negative    Mold 2 Negative    Mold 3 Negative    Mold 4 Negative    Cat Negative    Dog Negative    Cockroach 1+    Mite mix 1+    Other Omitted             Allergy testing results were read and interpreted by myself, documented by clinical staff.      Malachi Bonds, MD  [facility]

## 2021-10-04 NOTE — Patient Instructions (Addendum)
1. Chronic rhinitis - Testing today showed: dust mites and cockroach. - Copy of test results provided.  - Avoidance measures provided. - Continue with: Ryaltris (olopatadine/mometasone) two sprays per nostril 1-2 times daily as needed and Allegra (fexofenadine) 180mg  tablet once daily - Add on Augmentin 875mg  twice daily for ten days. - This should cover the bacteria involved with the chalazion and the sinus infection. - We are going to refer you to ENT.   2. Return in about 6 months (around 04/04/2022).    Please inform of any Emergency Department visits, hospitalizations, or changes in symptoms. Call 04/06/2022 before going to the ED for breathing or allergy symptoms since we might be able to fit you in for a sick visit. Feel free to contact us anytime with any questions, problems, or concerns.  It was a pleasure to see you again today!  Websites that have reliable patient information: 1. American Academy of Asthma, Allergy, and Immunology: www.aaaai.org 2. Food Allergy Research and Education (FARE): foodallergy.org 3. Mothers of Asthmatics: http://www.asthmacommunitynetwork.org 4. American College of Allergy, Asthma, and Immunology: www.acaai.org   COVID-19 Vaccine Information can be found at: [place] For questions related to vaccine distribution or appointments, please email [email or web address] or call [phone].   We realize that you might be concerned about having an allergic reaction to the COVID19 vaccines. To help with that concern, WE ARE OFFERING THE COVID19 VACCINES IN OUR OFFICE! Ask the front desk for dates!     Like [email or web address] on [phone] and Instagram for our latest updates!      A healthy democracy works best when [place] participate! Make sure you are registered to vote! If you have moved or changed any of your contact information, you will need to get this updated before voting!  In some cases,  you MAY be able to register to vote online: [employer]       Food Perc - 10/04/21 1741       Test Information   Time Antigen Placed 1745    Allergen Manufacturer Greer    Location Arm    Number of allergen test 10      Food   1. Peanut Negative    2. Soybean food Negative    3. Wheat, whole Negative    4. Sesame Negative    5. Milk, cow Negative    6. Egg White, chicken Negative    7. Casein Negative    8. Shellfish mix Negative    9. Fish mix Negative    10. Cashew Negative             Intradermal - 10/04/21 1741     Time Antigen Placed 1720    Location Arm    Number of Test 15    Intradermal Select    Control Negative    10/06/21 Negative    Johnson Negative    7 Grass Negative    Ragweed mix Negative    Weed mix Negative    Tree mix Negative    Mold 1 Negative    Mold 2 Negative    Mold 3 Negative    Mold 4 Negative    Cat Negative    Dog Negative    Cockroach 1+    Mite mix 1+    Other Omitted              Control of Dust Mite Allergen    Dust mites play a major role in allergic asthma and rhinitis.  They occur  in environments with high humidity wherever human skin is found.  Dust mites absorb humidity from the atmosphere (ie, they do not drink) and feed on organic matter (including shed human and animal skin).  Dust mites are a microscopic type of insect that you cannot see with the naked eye.  High levels of dust mites have been detected from mattresses, pillows, carpets, upholstered furniture, bed covers, clothes, soft toys and any woven material.  The principal allergen of the dust mite is found in its feces.  A gram of dust may contain 1,000 mites and 250,000 fecal particles.  Mite antigen is easily measured in the air during house cleaning activities.  Dust mites do not bite and do not cause harm to humans, other than by triggering allergies/asthma.    Ways to decrease your exposure to dust mites in  your home:  Encase mattresses, box springs and pillows with a mite-impermeable barrier or cover   Wash sheets, blankets and drapes weekly in hot water (130 F) with detergent and dry them in a dryer on the hot setting.  Have the room cleaned frequently with a vacuum cleaner and a damp dust-mop.  For carpeting or rugs, vacuuming with a vacuum cleaner equipped with a high-efficiency particulate air (HEPA) filter.  The dust mite allergic individual should not be in a room which is being cleaned and should wait 1 hour after cleaning before going into the room. Do not sleep on upholstered furniture (eg, couches).   If possible removing carpeting, upholstered furniture and drapery from the home is ideal.  Horizontal blinds should be eliminated in the rooms where the person spends the most time (bedroom, study, television room).  Washable vinyl, roller-type shades are optimal. Remove all non-washable stuffed toys from the bedroom.  Wash stuffed toys weekly like sheets and blankets above.   Reduce indoor humidity to less than 50%.  Inexpensive humidity monitors can be purchased at most hardware stores.  Do not use a humidifier as can make the problem worse and are not recommended.   Control of Cockroach Allergen  Cockroach allergen has been identified as an important cause of acute attacks of asthma, especially in urban settings.  There are fifty-five species of cockroach that exist in the [place], however only three, the [place], [place] species produce allergen that can affect patients with Asthma.  Allergens can be obtained from fecal particles, egg casings and secretions from cockroaches.    Remove food sources. Reduce access to water. Seal access and entry points. Spray runways with 0.5-1% Diazinon or Chlorpyrifos Blow boric acid power under stoves and refrigerator. Place bait stations (hydramethylnon) at feeding sites.

## 2021-10-05 ENCOUNTER — Encounter: Payer: Self-pay | Admitting: Allergy & Immunology

## 2021-10-05 MED ORDER — RYALTRIS 665-25 MCG/ACT NA SUSP: 2.0000 | Freq: Two times a day (BID) | NASAL | 5 refills | Status: DC | PRN

## 2021-10-05 NOTE — Addendum Note (Signed)
Addended by: Rolland Bimler D on: 10/05/2021 05:09 PM   Modules accepted: Orders

## 2021-10-05 NOTE — Addendum Note (Signed)
Addended by: Rolland Bimler D on: 10/05/2021 05:06 PM   Modules accepted: Orders

## 2021-10-12 NOTE — Progress Notes (Signed)
The patient has been referred a few times by PCP to ENT providers. It looks like the patient is scheduled on 11/03/2021 with Dr Aleene Davidson @ [facility] ENT according to Care Everywhere. I called and left the patient a voicemail to confirm.

## 2021-10-15 ENCOUNTER — Telehealth: Payer: BC Managed Care – PPO | Admitting: Nurse Practitioner

## 2021-10-15 DIAGNOSIS — S63630A Sprain of interphalangeal joint of right index finger, initial encounter: Secondary | ICD-10-CM

## 2021-10-15 MED ORDER — NAPROXEN 500 MG PO TABS: 500.0000 mg | ORAL_TABLET | Freq: Two times a day (BID) | ORAL | 1 refills | Status: DC

## 2021-10-15 NOTE — Patient Instructions (Signed)
Finger Sprain, Adult A finger sprain is a tear or stretch in a ligament in a finger. Ligaments are tissues that connect bones to each other. What are the causes? Finger sprains happen when something makes the bones in the hand move in an abnormal way. They are often caused by a fall or an accident. What increases the risk? Playing sports where it is easy to fall, such as skiing. Playing sports that involve catching an object, such as basketball. Not being strong or flexible. What are the signs or symptoms? Pain or tenderness in the finger. Swelling in the finger. A bluish look to the finger. Getting bruises. Trouble bending the finger. How is this treated? Treatment depends on how bad the sprain is. It may involve: Preventing the finger from moving. Your finger may be wrapped in a bandage (dressing) or splint. Your finger also may be taped to the fingers next to it (buddy taping). Medicines for pain. Exercises to strengthen the finger. Surgery to reconnect the ligament to a bone. This may be done if the ligament was torn all the way. Follow these instructions at home: If you have a splint that can be taken off:  Wear the splint as told by your doctor. Take it off only as told by your doctor. Check the skin around the splint every day. Tell your doctor if you see problems. Loosen the splint if your fingers: Tingle. Become numb. Turn cold and blue. Keep the splint clean. If the splint is not waterproof: Do not let it get wet. Cover it with a watertight covering when you take a bath or shower. Managing pain, stiffness, and swelling If told, put ice on the injured area. To do this: If you have a removable splint, take it off as told by your doctor. Put ice in a plastic bag. Place a towel between your skin and the bag. Leave the ice on for 20 minutes, 2-3 times a day. Take off the ice if your skin turns bright red. This is very important. If you cannot feel pain, heat, or cold, you  have a greater risk of damage to the area. Move your fingers often. Raise the injured area above the level of your heart while you are sitting or lying down. Medicines Take over-the-counter and prescription medicines only as told by your doctor. Ask your doctor if you should avoid driving or using machines while you are taking your medicine. General instructions Keep any bandages dry until your doctor says they can be taken off. If your fingers are buddy taped, replace your buddy taping as told by your doctor. Do exercises as told by your doctor or physical therapist. Do not wear rings on your injured finger. Keep all follow-up visits. Contact a health care provider if: Your pain is not helped by medicine. Your bruising or swelling gets worse. Your splint is damaged. You have a fever. Get help right away if: You lose feeling in your finger. Your finger turns blue. Your finger feels colder than normal when you touch it. Summary A finger sprain is a tear or stretch in a ligament in your finger. Ligaments are tissues that connect bones to each other. If you have a splint, loosen the splint if your fingers tingle, become numb, or turn cold and blue. Move your fingers often. If told, put ice on the injured area. This information is not intended to replace advice given to you by your health care provider. Make sure you discuss any questions you have with  your health care provider. Document Revised: 08/25/2020 Document Reviewed: 08/25/2020 Elsevier Patient Education  2022 [employer].

## 2021-10-15 NOTE — Progress Notes (Signed)
Virtual Visit Consent   Jennifer Stokes, you are scheduled for a virtual visit with Jennifer Daphine Deutscher, FNP, a [facility] provider, today.     Just as with appointments in the office, your consent must be obtained to participate.  Your consent will be active for this visit and any virtual visit you may have with one of our providers in the next 365 days.     If you have a MyChart account, a copy of this consent can be sent to you electronically.  All virtual visits are billed to your insurance company just like a traditional visit in the office.    As this is a virtual visit, video technology does not allow for your provider to perform a traditional examination.  This may limit your provider's ability to fully assess your condition.  If your provider identifies any concerns that need to be evaluated in person or the need to arrange testing (such as labs, EKG, etc.), we will make arrangements to do so.     Although advances in technology are sophisticated, we cannot ensure that it will always work on either your end or our end.  If the connection with a video visit is poor, the visit may have to be switched to a telephone visit.  With either a video or telephone visit, we are not always able to ensure that we have a secure connection.     I need to obtain your verbal consent now.   Are you willing to proceed with your visit today? YES   Jennifer Stokes has provided verbal consent on 10/15/2021 for a virtual visit (video or telephone).   Jennifer Daphine Deutscher, FNP   Date: 10/15/2021 3:58 PM   Virtual Visit via Video Note   I, Jennifer Stokes, connected with Jennifer Stokes ([id number], 64-Feb-1957) on 10/15/21 at  4:00 PM EST by a video-enabled telemedicine application and verified that I am speaking with the correct person using two identifiers.  Location: Patient: Virtual Visit Location Patient: Home Provider: Virtual Visit Location Provider: Mobile   I discussed the limitations of  evaluation and management by telemedicine and the availability of in person appointments. The patient expressed understanding and agreed to proceed.    History of Present Illness: Jennifer Stokes is a 65 y.o. who identifies as a female who was assigned female at birth, and is being seen today for finger pain.  HPI: Hand Pain  The incident occurred 6 to 12 hours ago. Incident location: was playing tennis this morning and finger tarted hurting afterwards. The injury mechanism was a direct blow. The pain is present in the right fingers (index finger). The quality of the pain is described as aching. The pain does not radiate. The pain is at a severity of 0/10 (as long as does not touch it.). The pain is mild. The pain has been Intermittent since the incident. The symptoms are aggravated by movement. She has tried nothing for the symptoms. The treatment provided no relief.   ROS  Problems:  Patient Active Problem List   Diagnosis Date Noted   Allergic conjunctivitis of both eyes 06/29/2021   Irritant contact dermatitis due to plants, except food 06/29/2021   Seasonal allergies 06/29/2021   Recurrent sinusitis 06/29/2021   Right hip pain 12/14/2020   Exposure to COVID-19 virus 12/14/2020   Preventative health care 12/14/2020   Preop examination 12/14/2020   Heel pain 07/09/2018   Contusion, arm, upper 06/04/2018   Degenerative arthritis of left knee  05/08/2017   Residual foreign body in soft tissue 05/14/2016   HEAD TRAUMA 08/19/2009   DEPRESSION 12/18/2007    Allergies:  Allergies  Allergen Reactions   Codeine    Prednisone    Medications:  Current Outpatient Medications:    azelastine (OPTIVAR) 0.05 % ophthalmic solution, Place 1 drop into both eyes 2 (two) times daily. (Patient not taking: Reported on 10/04/2021), Disp: 6 mL, Rfl: 12   Methylphenidate HCl ER, PM, (JORNAY PM) 100 MG CP24, Jornay PM 100 mg capsule,delayed release,extended release sprinkle  TAKE 1 CAPSULE BY MOUTH AT  BEDTIME, Disp: , Rfl:    Olopatadine-Mometasone (RYALTRIS) 665-25 MCG/ACT SUSP, Place 2 sprays into the nose 2 (two) times daily as needed., Disp: 29 g, Rfl: 5   sertraline (ZOLOFT) 25 MG tablet, TAKE 1 BY MOUTH DAILY, Disp: 90 tablet, Rfl: 3  Observations/Objective: Patient is well-developed, well-nourished in no acute distress.  Resting comfortably  at home.  Head is normocephalic, atraumatic.  No labored breathing.  Speech is clear and coherent with logical content.  Patient is alert and oriented at baseline.  Right index finger medial PIP joint is edematous with contusion on lateral side. FROM of finger with pain in full flexion.  Assessment and Plan:  Jennifer Stokes in today with chief complaint of Hand Pain   1. Sprain of interphalangeal joint of right index finger, initial encounter Rest Ice bid Wrap if helps Elevate when sitting Epsom salt soaks Meds ordered this encounter  Medications   naproxen (NAPROSYN) 500 MG tablet    Sig: Take 1 tablet (500 mg total) by mouth 2 (two) times daily with a meal.    Dispense:  60 tablet    Refill:  1    Order Specific Question:   Supervising Provider    Answer:   Eber Hong [[id number]]      Follow Up Instructions: I discussed the assessment and treatment plan with the patient. The patient was provided an opportunity to ask questions and all were answered. The patient agreed with the plan and demonstrated an understanding of the instructions.  A copy of instructions were sent to the patient via MyChart.  The patient was advised to call back or seek an in-person evaluation if the symptoms worsen or if the condition fails to improve as anticipated.  Time:  I spent 10 minutes with the patient via telehealth technology discussing the above problems/concerns.    Jennifer Daphine Deutscher, FNP

## 2021-10-16 DIAGNOSIS — M79644 Pain in right finger(s): Secondary | ICD-10-CM | POA: Diagnosis not present

## 2021-10-18 ENCOUNTER — Ambulatory Visit: Payer: BC Managed Care – PPO | Admitting: Allergy & Immunology

## 2021-10-18 ENCOUNTER — Other Ambulatory Visit: Payer: Self-pay | Admitting: Orthopedic Surgery

## 2021-10-18 DIAGNOSIS — R2231 Localized swelling, mass and lump, right upper limb: Secondary | ICD-10-CM | POA: Diagnosis not present

## 2021-10-20 ENCOUNTER — Other Ambulatory Visit: Payer: Self-pay

## 2021-10-20 ENCOUNTER — Encounter (HOSPITAL_BASED_OUTPATIENT_CLINIC_OR_DEPARTMENT_OTHER): Payer: Self-pay | Admitting: Orthopedic Surgery

## 2021-10-21 NOTE — Progress Notes (Signed)

## 2021-11-01 ENCOUNTER — Encounter (HOSPITAL_BASED_OUTPATIENT_CLINIC_OR_DEPARTMENT_OTHER): Payer: Self-pay | Admitting: Orthopedic Surgery

## 2021-11-01 ENCOUNTER — Ambulatory Visit (HOSPITAL_BASED_OUTPATIENT_CLINIC_OR_DEPARTMENT_OTHER): Payer: BC Managed Care – PPO | Admitting: Anesthesiology

## 2021-11-01 ENCOUNTER — Encounter (HOSPITAL_BASED_OUTPATIENT_CLINIC_OR_DEPARTMENT_OTHER)
Admission: RE | Disposition: A | Discharge status: Home / Self Care | Payer: Self-pay | Source: Home / Self Care | Attending: Orthopedic Surgery

## 2021-11-01 ENCOUNTER — Other Ambulatory Visit: Payer: Self-pay

## 2021-11-01 ENCOUNTER — Ambulatory Visit (HOSPITAL_BASED_OUTPATIENT_CLINIC_OR_DEPARTMENT_OTHER)
Admission: RE | Admit: 2021-11-01 | Discharge: 2021-11-01 | Disposition: A | Discharge status: Home / Self Care | Payer: BC Managed Care – PPO | Attending: Orthopedic Surgery | Admitting: Orthopedic Surgery

## 2021-11-01 DIAGNOSIS — M67441 Ganglion, right hand: Secondary | ICD-10-CM | POA: Diagnosis not present

## 2021-11-01 DIAGNOSIS — R2231 Localized swelling, mass and lump, right upper limb: Secondary | ICD-10-CM | POA: Diagnosis not present

## 2021-11-01 DIAGNOSIS — F32A Depression, unspecified: Secondary | ICD-10-CM | POA: Diagnosis not present

## 2021-11-01 DIAGNOSIS — M19041 Primary osteoarthritis, right hand: Secondary | ICD-10-CM | POA: Diagnosis not present

## 2021-11-01 HISTORY — PX: EXCISION METACARPAL MASS: SHX6372

## 2021-11-01 HISTORY — DX: Unspecified osteoarthritis, unspecified site: M19.90

## 2021-11-01 SURGERY — EXCISION METACARPAL MASS
Anesthesia: Regional | Site: Finger | Laterality: Right

## 2021-11-01 MED ORDER — ACETAMINOPHEN 500 MG PO TABS
ORAL_TABLET | ORAL | Status: AC
  Filled 2021-11-01: qty 2

## 2021-11-01 MED ORDER — TRAMADOL HCL 50 MG PO TABS
ORAL_TABLET | ORAL | 0 refills | Status: DC
Start: 2021-11-01 — End: 2021-11-10

## 2021-11-01 MED ORDER — ACETAMINOPHEN 500 MG PO TABS
1000.0000 mg | ORAL_TABLET | Freq: Once | ORAL | Status: AC
  Administered 2021-11-01: 1000 mg via ORAL

## 2021-11-01 MED ORDER — ONDANSETRON HCL 4 MG/2ML IJ SOLN
INTRAMUSCULAR | Status: DC | PRN
Start: 2021-11-01 — End: 2021-11-01
  Administered 2021-11-01: 4 mg via INTRAVENOUS

## 2021-11-01 MED ORDER — MIDAZOLAM HCL 5 MG/5ML IJ SOLN
INTRAMUSCULAR | Status: DC | PRN
  Administered 2021-11-01: 2 mg via INTRAVENOUS

## 2021-11-01 MED ORDER — CELECOXIB 200 MG PO CAPS
200.0000 mg | ORAL_CAPSULE | Freq: Once | ORAL | Status: AC
  Administered 2021-11-01: 200 mg via ORAL

## 2021-11-01 MED ORDER — PROPOFOL 10 MG/ML IV BOLUS
INTRAVENOUS | Status: AC
  Filled 2021-11-01: qty 20

## 2021-11-01 MED ORDER — CEFAZOLIN SODIUM-DEXTROSE 2-4 GM/100ML-% IV SOLN
2.0000 g | INTRAVENOUS | Status: AC
  Administered 2021-11-01: 2 g via INTRAVENOUS

## 2021-11-01 MED ORDER — LACTATED RINGERS IV SOLN: INTRAVENOUS | Status: DC

## 2021-11-01 MED ORDER — CEFAZOLIN SODIUM-DEXTROSE 2-4 GM/100ML-% IV SOLN
INTRAVENOUS | Status: AC
  Filled 2021-11-01: qty 100

## 2021-11-01 MED ORDER — MIDAZOLAM HCL 2 MG/2ML IJ SOLN
INTRAMUSCULAR | Status: AC
  Filled 2021-11-01: qty 2

## 2021-11-01 MED ORDER — PROPOFOL 10 MG/ML IV BOLUS
INTRAVENOUS | Status: DC | PRN
  Administered 2021-11-01: 20 mg via INTRAVENOUS
  Administered 2021-11-01: 50 mg via INTRAVENOUS

## 2021-11-01 MED ORDER — BUPIVACAINE HCL (PF) 0.25 % IJ SOLN
INTRAMUSCULAR | Status: DC | PRN
  Administered 2021-11-01: 10 mL

## 2021-11-01 MED ORDER — PROPOFOL 500 MG/50ML IV EMUL
INTRAVENOUS | Status: AC
  Filled 2021-11-01: qty 50

## 2021-11-01 MED ORDER — FENTANYL CITRATE (PF) 100 MCG/2ML IJ SOLN
INTRAMUSCULAR | Status: DC | PRN
  Administered 2021-11-01: 100 ug via INTRAVENOUS

## 2021-11-01 MED ORDER — PROPOFOL 500 MG/50ML IV EMUL
INTRAVENOUS | Status: DC | PRN
Start: 2021-11-01 — End: 2021-11-01
  Administered 2021-11-01: 100 ug/kg/min via INTRAVENOUS

## 2021-11-01 MED ORDER — FENTANYL CITRATE (PF) 100 MCG/2ML IJ SOLN
INTRAMUSCULAR | Status: AC
  Filled 2021-11-01: qty 2

## 2021-11-01 MED ORDER — CELECOXIB 200 MG PO CAPS
ORAL_CAPSULE | ORAL | Status: AC
  Filled 2021-11-01: qty 1

## 2021-11-01 SURGICAL SUPPLY — 55 items
APL PRP STRL LF DISP 70% ISPRP (MISCELLANEOUS) ×1
APL SKNCLS STERI-STRIP NONHPOA (GAUZE/BANDAGES/DRESSINGS)
BENZOIN TINCTURE PRP APPL 2/3 (GAUZE/BANDAGES/DRESSINGS) IMPLANT
BLADE MINI RND TIP GREEN BEAV (BLADE) IMPLANT
BLADE SURG 15 STRL LF DISP TIS (BLADE) ×2 IMPLANT
BLADE SURG 15 STRL SS (BLADE) ×4
BNDG CMPR 5X2 CHSV 1 LYR STRL (GAUZE/BANDAGES/DRESSINGS)
BNDG CMPR 9X4 STRL LF SNTH (GAUZE/BANDAGES/DRESSINGS) ×1
BNDG COHESIVE 1X5 TAN STRL LF (GAUZE/BANDAGES/DRESSINGS) IMPLANT
BNDG COHESIVE 2X5 TAN ST LF (GAUZE/BANDAGES/DRESSINGS) IMPLANT
BNDG CONFORM 2 STRL LF (GAUZE/BANDAGES/DRESSINGS) IMPLANT
BNDG ELASTIC 2X5.8 VLCR STR LF (GAUZE/BANDAGES/DRESSINGS) IMPLANT
BNDG ELASTIC 3X5.8 VLCR STR LF (GAUZE/BANDAGES/DRESSINGS) IMPLANT
BNDG ESMARK 4X9 LF (GAUZE/BANDAGES/DRESSINGS) ×1 IMPLANT
BNDG GAUZE 1X2.1 STRL (MISCELLANEOUS) IMPLANT
BNDG GAUZE ELAST 4 BULKY (GAUZE/BANDAGES/DRESSINGS) IMPLANT
BNDG PLASTER X FAST 3X3 WHT LF (CAST SUPPLIES) IMPLANT
BNDG PLSTR 9X3 FST ST WHT (CAST SUPPLIES)
CHLORAPREP W/TINT 26 (MISCELLANEOUS) ×2 IMPLANT
CORD BIPOLAR FORCEPS 12FT (ELECTRODE) ×2 IMPLANT
COVER BACK TABLE 60X90IN (DRAPES) ×2 IMPLANT
COVER MAYO STAND STRL (DRAPES) ×2 IMPLANT
CUFF TOURN SGL QUICK 18X4 (TOURNIQUET CUFF) ×2 IMPLANT
DRAPE EXTREMITY T 121X128X90 (DISPOSABLE) ×2 IMPLANT
DRAPE SURG 17X23 STRL (DRAPES) ×1 IMPLANT
GAUZE 4X4 16PLY ~~LOC~~+RFID DBL (SPONGE) ×1 IMPLANT
GAUZE SPONGE 4X4 12PLY STRL (GAUZE/BANDAGES/DRESSINGS) ×2 IMPLANT
GAUZE XEROFORM 1X8 LF (GAUZE/BANDAGES/DRESSINGS) ×2 IMPLANT
GLOVE SRG 8 PF TXTR STRL LF DI (GLOVE) ×1 IMPLANT
GLOVE SURG ENC MOIS LTX SZ7.5 (GLOVE) ×2 IMPLANT
GLOVE SURG UNDER POLY LF SZ8 (GLOVE) ×2
GOWN STRL REUS W/ TWL LRG LVL3 (GOWN DISPOSABLE) ×1 IMPLANT
GOWN STRL REUS W/TWL LRG LVL3 (GOWN DISPOSABLE) ×2
GOWN STRL REUS W/TWL XL LVL3 (GOWN DISPOSABLE) ×2 IMPLANT
NDL HYPO 25X1 1.5 SAFETY (NEEDLE) ×1 IMPLANT
NEEDLE HYPO 25X1 1.5 SAFETY (NEEDLE) ×2 IMPLANT
NS IRRIG 1000ML POUR BTL (IV SOLUTION) ×2 IMPLANT
PACK BASIN DAY SURGERY FS (CUSTOM PROCEDURE TRAY) ×2 IMPLANT
PAD CAST 3X4 CTTN HI CHSV (CAST SUPPLIES) IMPLANT
PAD CAST 4YDX4 CTTN HI CHSV (CAST SUPPLIES) IMPLANT
PADDING CAST ABS 4INX4YD NS (CAST SUPPLIES) ×1
PADDING CAST ABS COTTON 4X4 ST (CAST SUPPLIES) ×1 IMPLANT
PADDING CAST COTTON 3X4 STRL (CAST SUPPLIES)
PADDING CAST COTTON 4X4 STRL (CAST SUPPLIES)
SPLINT FINGER 3.25 911903 (SOFTGOODS) ×1 IMPLANT
STOCKINETTE 4X48 STRL (DRAPES) ×2 IMPLANT
STRIP CLOSURE SKIN 1/2X4 (GAUZE/BANDAGES/DRESSINGS) IMPLANT
SUT ETHILON 3 0 PS 1 (SUTURE) IMPLANT
SUT ETHILON 4 0 PS 2 18 (SUTURE) ×2 IMPLANT
SUT MERSILENE 4 0 P 3 (SUTURE) ×1 IMPLANT
SUT PROLENE 4 0 PS 2 18 (SUTURE) ×1 IMPLANT
SYR BULB EAR ULCER 3OZ GRN STR (SYRINGE) ×2 IMPLANT
SYR CONTROL 10ML LL (SYRINGE) ×2 IMPLANT
TOWEL GREEN STERILE FF (TOWEL DISPOSABLE) ×4 IMPLANT
UNDERPAD 30X36 HEAVY ABSORB (UNDERPADS AND DIAPERS) ×2 IMPLANT

## 2021-11-01 NOTE — Discharge Instructions (Addendum)

## 2021-11-01 NOTE — Addendum Note (Signed)
Addendum  created 11/01/21 1431 by Leilani Able, MD   Attestation recorded in Intraprocedure, Clinical Note Signed, Flowsheet accepted, Intraprocedure Attestations filed

## 2021-11-01 NOTE — Transfer of Care (Signed)
Immediate Anesthesia Transfer of Care Note  Patient: Jennifer Stokes  Procedure(s) Performed: EXCISION METACARPAL MASS RIGHT INDEX FINGER (Right: Finger)  Patient Location: PACU  Anesthesia Type:MAC  Level of Consciousness: awake, alert  and oriented  Airway & Oxygen Therapy: Patient Spontanous Breathing  Post-op Assessment: Report given to RN and Post -op Vital signs reviewed and stable  Post vital signs: Reviewed and stable  Last Vitals:  Vitals Value Taken Time  BP 117/89 11/01/21 1354  Temp    Pulse 76 11/01/21 1357  Resp 16 11/01/21 1357  SpO2 98 % 11/01/21 1357  Vitals shown include unvalidated device data.  Last Pain:  Vitals:   11/01/21 1153  TempSrc: Oral  PainSc: 7          Complications: No notable events documented.

## 2021-11-01 NOTE — H&P (Signed)
Jennifer Stokes is an 66 y.o. female.   Chief Complaint: finger mass HPI: 66 yo female with mass dorsum right index finger at pip joint.  It is bothersome to her.  She wishes to have it removed.  Allergies:  Allergies  Allergen Reactions   Codeine Other (See Comments)    "Wired"   Prednisone Other (See Comments)    "Wired"    Past Medical History:  Diagnosis Date   ADD (attention deficit disorder)    Arthritis     Past Surgical History:  Procedure Laterality Date   BILATERAL SALPINGECTOMY Bilateral    COSMETIC SURGERY     TOTAL HIP ARTHROPLASTY Right 12/2020    Family History: Family History  Problem Relation Age of Onset   Eczema Mother    Allergic rhinitis Mother    Heart disease Mother    Allergic rhinitis Father    Heart disease Father    Bipolar disorder Son    Eczema Daughter    Diabetes Neg Hx    Hyperlipidemia Neg Hx    Hypertension Neg Hx    Stroke Neg Hx     Social History:   reports that she has never smoked. She has never used smokeless tobacco. She reports that she does not drink alcohol and does not use drugs.  Medications: Medications Prior to Admission  Medication Sig Dispense Refill   Methylphenidate HCl ER, PM, (JORNAY PM) 100 MG CP24 Jornay PM 100 mg capsule,delayed release,extended release sprinkle  TAKE 1 CAPSULE BY MOUTH AT BEDTIME     sertraline (ZOLOFT) 25 MG tablet TAKE 1 BY MOUTH DAILY 90 tablet 3   azelastine (OPTIVAR) 0.05 % ophthalmic solution Place 1 drop into both eyes 2 (two) times daily. (Patient not taking: Reported on 10/04/2021) 6 mL 12   naproxen (NAPROSYN) 500 MG tablet Take 1 tablet (500 mg total) by mouth 2 (two) times daily with a meal. 60 tablet 1   Olopatadine-Mometasone (RYALTRIS) 665-25 MCG/ACT SUSP Place 2 sprays into the nose 2 (two) times daily as needed. 29 g 5    No results found for this or any previous visit (from the past 48 hour(s)).  No results found.    Blood pressure 111/68, pulse 68, temperature 97.6  F (36.4 C), temperature source Oral, resp. rate 16, height 5\' 7"  (1.702 m), weight 70.6 kg, SpO2 100 %.  General appearance: alert, cooperative, and appears stated age Head: Normocephalic, without obvious abnormality, atraumatic Neck: supple, symmetrical, trachea midline Cardio: regular rate and rhythm Resp: clear to auscultation bilaterally Extremities: Intact sensation and capillary refill all digits.  +epl/fpl/io.  No wounds.  Pulses: 2+ and symmetric Skin: Skin color, texture, turgor normal. No rashes or lesions Neurologic: Grossly normal Incision/Wound: none  Assessment/Plan Right index finger mass.  Non operative and operative treatment options have been discussed with the patient and patient wishes to proceed with operative treatment. Risks, benefits, and alternatives of surgery have been discussed and the patient agrees with the plan of care.   11/01/2021, 12:05 PM

## 2021-11-01 NOTE — Anesthesia Preprocedure Evaluation (Addendum)
Anesthesia Evaluation  Patient identified by MRN, date of birth, ID band Patient awake    Reviewed: Allergy & Precautions, NPO status , Patient's Chart, lab work & pertinent test results  Airway Mallampati: II  TM Distance: >3 FB Neck ROM: Full    Dental no notable dental hx. (+) Dental Advisory Given, Teeth Intact   Pulmonary neg pulmonary ROS,    Pulmonary exam normal breath sounds clear to auscultation       Cardiovascular negative cardio ROS Normal cardiovascular exam Rhythm:Regular Rate:Normal     Neuro/Psych PSYCHIATRIC DISORDERS Depression negative neurological ROS     GI/Hepatic negative GI ROS, Neg liver ROS,   Endo/Other  negative endocrine ROS  Renal/GU negative Renal ROS     Musculoskeletal  (+) Arthritis ,   Abdominal   Peds  Hematology negative hematology ROS (+)   Anesthesia Other Findings   Reproductive/Obstetrics                           Anesthesia Physical Anesthesia Plan  ASA: 2  Anesthesia Plan: Bier Block and Bier Block-LIDOCAINE ONLY   Post-op Pain Management: Minimal or no pain anticipated, Tylenol PO (pre-op) and Celebrex PO (pre-op)   Induction: Intravenous  PONV Risk Score and Plan: 2 and Ondansetron, Treatment may vary due to age or medical condition, Propofol infusion and TIVA  Airway Management Planned: Natural Airway  Additional Equipment:   Intra-op Plan:   Post-operative Plan:   Informed Consent: I have reviewed the patients History and Physical, chart, labs and discussed the procedure including the risks, benefits and alternatives for the proposed anesthesia with the patient or authorized representative who has indicated his/her understanding and acceptance.     Dental advisory given  Plan Discussed with: CRNA  Anesthesia Plan Comments:        Anesthesia Quick Evaluation

## 2021-11-01 NOTE — Op Note (Signed)
NAME: Jennifer Stokes MEDICAL RECORD NO: [id number] DATE OF BIRTH: 08-18-1956 FACILITY: [facility] LOCATION: [facility] PHYSICIAN: Tami Ribas, MD   OPERATIVE REPORT   DATE OF PROCEDURE: 11/01/21    PREOPERATIVE DIAGNOSIS: Right index finger ganglion   POSTOPERATIVE DIAGNOSIS: Right index finger ganglion   PROCEDURE: Excision right index finger ganglion cyst   SURGEON:  Betha Loa, M.D.   ASSISTANT: none   ANESTHESIA:  Bier block with sedation   INTRAVENOUS FLUIDS:  Per anesthesia flow sheet.   ESTIMATED BLOOD LOSS:  Minimal.   COMPLICATIONS:  None.   SPECIMENS: Right index finger ganglion to pathology   TOURNIQUET TIME:   Right forearm: 15 minutes at 250 mmHg   DISPOSITION:  Stable to PACU.   INDICATIONS: 66 year old female has noted a mass on the dorsum of the right index finger at the PIP joint.  It is bothersome to her.  She wishes to have it removed.  Risks, benefits and alternatives of surgery were discussed including the risks of blood loss, infection, damage to nerves, vessels, tendons, ligaments, bone for surgery, need for additional surgery, complications with wound healing, continued pain, stiffness, , recurrence.  She voiced understanding of these risks and elected to proceed.  OPERATIVE COURSE:  After being identified preoperatively by myself,  the patient and I agreed on the procedure and site of the procedure.  The surgical site was marked.  Surgical consent had been signed. She was given IV antibiotics as preoperative antibiotic prophylaxis. She was transferred to the operating room and placed on the operating table in supine position with the Right upper extremity on an arm board.  Sedation was induced by the anesthesiologist. A surgical pause was performed between the surgeons anesthesia and operating room staff and all were in agreement as to patient procedure and site procedure.  A digital block was performed with quarter percent plain Marcaine.   Right upper extremity was prepped and draped in normal sterile orthopedic fashion.  A surgical pause was performed between the surgeons, anesthesia, and operating room staff and all were in agreement as to the patient, procedure, and site of procedure.  Tourniquet at the proximal aspect of the forearm was inflated to 250 mmHg after exsanguination of the arm with an Esmarch bandage.  Incision was made over the mass at the dorsal radial aspect of the PIP joint of the index finger.  This is carried in subcutaneous tissues by spreading technique.  Bipolar electrocautery is used to obtain hemostasis.  The mass was easily identified.  Was filled with clear gelatinous fluid.  It was carefully freed from surrounding tissues.  There was a stalk coming from the PIP joint through the extensor tendon.  The cyst was removed.  The PIP joint was entered and debrided with the synovectomy rongeurs.  The rent in the extensor mechanism was repaired with a running 4-0 Mersilene suture.  The wound was copiously irrigated with sterile saline and closed with 4-0 nylon in a horizontal mattress fashion.  It was dressed with sterile Xeroform 4 x 4 and wrapped with a Coban dressing lightly.  An AlumaFoam splint was placed and wrapped lightly with Coban dressing.  The tourniquet was deflated at 15 minutes.  Fingertips were pink with brisk capillary refill after deflation of tourniquet.  The operative  drapes were broken down.  The patient was awoken from anesthesia safely.  She was transferred back to the stretcher and taken to PACU in stable condition.  I will see her  back in the office in 1 week for postoperative followup.  I will give her a prescription for Tramadol 50 mg 1-2 tabs PO q6 hours prn pain, dispense #15.   Betha Loa, MD Electronically signed, 11/01/21

## 2021-11-01 NOTE — Anesthesia Postprocedure Evaluation (Addendum)
Anesthesia Post Note  Patient: Jennifer Stokes  Procedure(s) Performed: EXCISION METACARPAL MASS RIGHT INDEX FINGER (Right: Finger)     Patient location during evaluation: Phase II Anesthesia Type: Bier Block and MAC Level of consciousness: awake Pain management: pain level controlled Vital Signs Assessment: post-procedure vital signs reviewed and stable Respiratory status: spontaneous breathing Cardiovascular status: stable Postop Assessment: no apparent nausea or vomiting Anesthetic complications: no   No notable events documented.  Last Vitals:  Vitals:   11/01/21 1355 11/01/21 1400  BP: 117/89 121/78  Pulse:  69  Resp:  20  Temp: (!) 36.2 C   SpO2: 98% 98%    Last Pain:  Vitals:   11/01/21 1400  TempSrc:   PainSc: 0-No pain                 John F Salome Arnt

## 2021-11-02 ENCOUNTER — Encounter (HOSPITAL_BASED_OUTPATIENT_CLINIC_OR_DEPARTMENT_OTHER): Payer: Self-pay | Admitting: Orthopedic Surgery

## 2021-11-02 LAB — SURGICAL PATHOLOGY

## 2021-11-03 DIAGNOSIS — Z8709 Personal history of other diseases of the respiratory system: Secondary | ICD-10-CM | POA: Diagnosis not present

## 2021-11-03 DIAGNOSIS — J310 Chronic rhinitis: Secondary | ICD-10-CM | POA: Diagnosis not present

## 2021-11-08 DIAGNOSIS — M25641 Stiffness of right hand, not elsewhere classified: Secondary | ICD-10-CM | POA: Diagnosis not present

## 2021-11-08 DIAGNOSIS — Z4889 Encounter for other specified surgical aftercare: Secondary | ICD-10-CM | POA: Diagnosis not present

## 2021-11-08 DIAGNOSIS — J310 Chronic rhinitis: Secondary | ICD-10-CM | POA: Diagnosis not present

## 2021-11-08 DIAGNOSIS — R2231 Localized swelling, mass and lump, right upper limb: Secondary | ICD-10-CM | POA: Diagnosis not present

## 2021-11-10 ENCOUNTER — Ambulatory Visit (INDEPENDENT_AMBULATORY_CARE_PROVIDER_SITE_OTHER): Payer: BC Managed Care – PPO | Admitting: Family Medicine

## 2021-11-10 ENCOUNTER — Encounter: Payer: Self-pay | Admitting: Family Medicine

## 2021-11-10 VITALS — BP 98/60 | HR 77 | Temp 97.8°F | Resp 18 | Ht 67.0 in | Wt 153.8 lb

## 2021-11-10 DIAGNOSIS — Z8249 Family history of ischemic heart disease and other diseases of the circulatory system: Secondary | ICD-10-CM

## 2021-11-10 DIAGNOSIS — R079 Chest pain, unspecified: Secondary | ICD-10-CM | POA: Diagnosis not present

## 2021-11-10 DIAGNOSIS — E785 Hyperlipidemia, unspecified: Secondary | ICD-10-CM | POA: Diagnosis not present

## 2021-11-10 NOTE — Progress Notes (Signed)
Established Patient Office Visit  Subjective:  Patient ID: Jennifer Stokes, female    DOB: Feb 25, 1956  Age: 66 y.o. MRN: [id number]  CC:  Chief Complaint  Patient presents with   Referral    Pt would like to discuss getting a referral for Cardio. Pt states having family hx of cardiac problems. Pt states not having sxs currently.     HPI Jennifer Stokes presents for cardiology testing.  She has a family history of heart disease and would like to see a cardiologist .     Past Medical History:  Diagnosis Date   ADD (attention deficit disorder)    Arthritis     Past Surgical History:  Procedure Laterality Date   BILATERAL SALPINGECTOMY Bilateral    COSMETIC SURGERY     EXCISION METACARPAL MASS Right 11/01/2021   Procedure: EXCISION METACARPAL MASS RIGHT INDEX FINGER;  Surgeon: Leanora Cover, MD;  Location: [facility];  Service: Orthopedics;  Laterality: Right;   TOTAL HIP ARTHROPLASTY Right 12/2020    Family History  Problem Relation Age of Onset   Eczema Mother    Allergic rhinitis Mother    Heart disease Mother    Allergic rhinitis Father    Heart disease Father    Bipolar disorder Son    Eczema Daughter    Diabetes Neg Hx    Hyperlipidemia Neg Hx    Hypertension Neg Hx    Stroke Neg Hx     Social History   Socioeconomic History   Marital status: Married    Spouse name: Not on file   Number of children: Not on file   Years of education: Not on file   Highest education level: Not on file  Occupational History   Occupation: Network engineer  Tobacco Use   Smoking status: Never   Smokeless tobacco: Never  Substance and Sexual Activity   Alcohol use: No   Drug use: No   Sexual activity: Yes    Partners: Male  Other Topics Concern   Not on file  Social History Narrative   Not on file   Social Determinants of Health   Financial Resource Strain: Not on file  Food Insecurity: Not on file  Transportation Needs: Not on file  Physical Activity: Not  on file  Stress: Not on file  Social Connections: Not on file  Intimate Partner Violence: Not on file    Outpatient Medications Prior to Visit  Medication Sig Dispense Refill   Methylphenidate HCl ER, PM, (JORNAY PM) 100 MG CP24 Jornay PM 100 mg capsule,delayed release,extended release sprinkle  TAKE 1 CAPSULE BY MOUTH AT BEDTIME     sertraline (ZOLOFT) 25 MG tablet TAKE 1 BY MOUTH DAILY 90 tablet 3   traMADol (ULTRAM) 50 MG tablet 1-2 tabs PO q6 hours prn pain 15 tablet 0   No facility-administered medications prior to visit.    Allergies  Allergen Reactions   Codeine Other (See Comments)    "Wired"   Prednisone Other (See Comments)    "Wired"    ROS Review of Systems  Constitutional:  Negative for appetite change, diaphoresis, fatigue and unexpected weight change.  Eyes:  Negative for pain, redness and visual disturbance.  Respiratory:  Negative for cough, chest tightness, shortness of breath and wheezing.   Cardiovascular:  Negative for chest pain, palpitations and leg swelling.  Endocrine: Negative for cold intolerance, heat intolerance, polydipsia, polyphagia and polyuria.  Genitourinary:  Negative for difficulty urinating, dysuria and frequency.  Neurological:  Negative for dizziness, light-headedness, numbness and headaches.     Objective:    Physical Exam Vitals and nursing note reviewed.  Constitutional:      Appearance: She is well-developed.  HENT:     Head: Normocephalic and atraumatic.  Eyes:     Conjunctiva/sclera: Conjunctivae normal.  Neck:     Thyroid: No thyromegaly.     Vascular: No carotid bruit or JVD.  Cardiovascular:     Rate and Rhythm: Normal rate and regular rhythm.     Heart sounds: Normal heart sounds. No murmur heard. Pulmonary:     Effort: Pulmonary effort is normal. No respiratory distress.     Breath sounds: Normal breath sounds. No wheezing or rales.  Chest:     Chest wall: No tenderness.  Musculoskeletal:     Cervical back:  Normal range of motion and neck supple.  Neurological:     Mental Status: She is alert and oriented to person, place, and time.    BP 98/60 (BP Location: Left Arm, Patient Position: Sitting, Cuff Size: Normal)    Pulse 77    Temp 97.8 F (36.6 C) (Oral)    Resp 18    Ht 5\' 7"  (1.702 m)    Wt 153 lb 12.8 oz (69.8 kg)    SpO2 100%    BMI 24.09 kg/m  Wt Readings from Last 3 Encounters:  11/10/21 153 lb 12.8 oz (69.8 kg)  11/01/21 155 lb 10.3 oz (70.6 kg)  10/04/21 156 lb 3.2 oz (70.9 kg)     Health Maintenance Due  Topic Date Due   PAP SMEAR-Modifier  04/25/2021   INFLUENZA VACCINE  05/16/2021   COVID-19 Vaccine (5 - Booster for Moderna series) 07/09/2021   TETANUS/TDAP  10/02/2021    There are no preventive care reminders to display for this patient.  Lab Results  Component Value Date   TSH 1.57 12/14/2020   Lab Results  Component Value Date   WBC 5.0 12/14/2020   HGB 14.0 12/14/2020   HCT 41.7 12/14/2020   MCV 93.6 12/14/2020   PLT 265.0 12/14/2020   Lab Results  Component Value Date   NA 137 12/14/2020   K 4.4 12/14/2020   CO2 30 12/14/2020   GLUCOSE 103 (H) 12/14/2020   BUN 17 12/14/2020   CREATININE 0.84 12/14/2020   BILITOT 0.4 12/14/2020   ALKPHOS 103 12/14/2020   AST 23 12/14/2020   ALT 23 12/14/2020   PROT 6.8 12/14/2020   ALBUMIN 4.5 12/14/2020   CALCIUM 9.7 12/14/2020   GFR 73.78 12/14/2020   Lab Results  Component Value Date   CHOL 263 (H) 12/14/2020   Lab Results  Component Value Date   HDL 109.40 12/14/2020   Lab Results  Component Value Date   LDLCALC 138 (H) 12/14/2020   Lab Results  Component Value Date   TRIG 75.0 12/14/2020   Lab Results  Component Value Date   CHOLHDL 2 12/14/2020   Lab Results  Component Value Date   HGBA1C 5.4 10/03/2011      Assessment & Plan:   Problem List Items Addressed This Visit   None Visit Diagnoses     Hyperlipidemia, unspecified hyperlipidemia type    -  Primary   Relevant Orders    Lipid panel   Comprehensive metabolic panel   Cardiology follow-up encounter       Relevant Orders   Ambulatory referral to Cardiology   Chest pain, unspecified type       Family history  of early CAD       Relevant Orders   CT CARDIAC SCORING       No orders of the defined types were placed in this encounter.   Follow-up: Return if symptoms worsen or fail to improve.    Keiasha Held, DO

## 2021-11-10 NOTE — Patient Instructions (Signed)
Cholesterol Content in Foods ?Cholesterol is a waxy, fat-like substance that helps to carry fat in the blood. The body needs cholesterol in small amounts, but too much cholesterol can cause damage to the arteries and heart. ?What foods have cholesterol? ?Cholesterol is found in animal-based foods, such as meat, seafood, and dairy. Generally, low-fat dairy and lean meats have less cholesterol than full-fat dairy and fatty meats. The milligrams of cholesterol per serving (mg per serving) of common cholesterol-containing foods are listed below. ?Meats and other proteins ?Egg -- one large whole egg has 186 mg. ?Veal shank -- 4 oz (113 g) has 141 mg. ?Lean ground turkey (93% lean) -- 4 oz (113 g) has 118 mg. ?Fat-trimmed lamb loin -- 4 oz (113 g) has 106 mg. ?Lean ground beef (90% lean) -- 4 oz (113 g) has 100 mg. ?Lobster -- 3.5 oz (99 g) has 90 mg. ?Pork loin chops -- 4 oz (113 g) has 86 mg. ?Canned salmon -- 3.5 oz (99 g) has 83 mg. ?Fat-trimmed beef top loin -- 4 oz (113 g) has 78 mg. ?Frankfurter -- 1 frank (3.5 oz or 99 g) has 77 mg. ?Crab -- 3.5 oz (99 g) has 71 mg. ?Roasted chicken without skin, white meat -- 4 oz (113 g) has 66 mg. ?Light bologna -- 2 oz (57 g) has 45 mg. ?Deli-cut turkey -- 2 oz (57 g) has 31 mg. ?Canned tuna -- 3.5 oz (99 g) has 31 mg. ?Bacon -- 1 oz (28 g) has 29 mg. ?Oysters and mussels (raw) -- 3.5 oz (99 g) has 25 mg. ?Mackerel -- 1 oz (28 g) has 22 mg. ?Trout -- 1 oz (28 g) has 20 mg. ?Pork sausage -- 1 link (1 oz or 28 g) has 17 mg. ?Salmon -- 1 oz (28 g) has 16 mg. ?Tilapia -- 1 oz (28 g) has 14 mg. ?Dairy ?Soft-serve ice cream -- ? cup (4 oz or 86 g) has 103 mg. ?Whole-milk yogurt -- 1 cup (8 oz or 245 g) has 29 mg. ?Cheddar cheese -- 1 oz (28 g) has 28 mg. ?American cheese -- 1 oz (28 g) has 28 mg. ?Whole milk -- 1 cup (8 oz or 250 mL) has 23 mg. ?2% milk -- 1 cup (8 oz or 250 mL) has 18 mg. ?Cream cheese -- 1 tablespoon (Tbsp) (14.5 g) has 15 mg. ?Cottage cheese -- ? cup (4 oz or 113  g) has 14 mg. ?Low-fat (1%) milk -- 1 cup (8 oz or 250 mL) has 10 mg. ?Sour cream -- 1 Tbsp (12 g) has 8.5 mg. ?Low-fat yogurt -- 1 cup (8 oz or 245 g) has 8 mg. ?Nonfat Greek yogurt -- 1 cup (8 oz or 228 g) has 7 mg. ?Half-and-half cream -- 1 Tbsp (15 mL) has 5 mg. ?Fats and oils ?Cod liver oil -- 1 tablespoon (Tbsp) (13.6 g) has 82 mg. ?Butter -- 1 Tbsp (14 g) has 15 mg. ?Lard -- 1 Tbsp (12.8 g) has 14 mg. ?Bacon grease -- 1 Tbsp (12.9 g) has 14 mg. ?Mayonnaise -- 1 Tbsp (13.8 g) has 5-10 mg. ?Margarine -- 1 Tbsp (14 g) has 3-10 mg. ?The items listed above may not be a complete list of foods with cholesterol. Exact amounts of cholesterol in these foods may vary depending on specific ingredients and brands. Contact a dietitian for more information. ?What foods do not have cholesterol? ?Most plant-based foods do not have cholesterol unless you combine them with a food that has cholesterol.   Foods without cholesterol include: ?Grains and cereals. ?Vegetables. ?Fruits. ?Vegetable oils, such as olive, canola, and sunflower oil. ?Legumes, such as peas, beans, and lentils. ?Nuts and seeds. ?Egg whites. ?The items listed above may not be a complete list of foods that do not have cholesterol. Contact a dietitian for more information. ?Summary ?The body needs cholesterol in small amounts, but too much cholesterol can cause damage to the arteries and heart. ?Cholesterol is found in animal-based foods, such as meat, seafood, and dairy. Generally, low-fat dairy and lean meats have less cholesterol than full-fat dairy and fatty meats. ?This information is not intended to replace advice given to you by your health care provider. Make sure you discuss any questions you have with your health care provider. ?Document Revised: 02/11/2021 Document Reviewed: 02/11/2021 ?Elsevier Patient Education ? 2022 Elsevier Inc. ? ?

## 2021-11-11 DIAGNOSIS — R2231 Localized swelling, mass and lump, right upper limb: Secondary | ICD-10-CM | POA: Diagnosis not present

## 2021-11-11 DIAGNOSIS — Z4889 Encounter for other specified surgical aftercare: Secondary | ICD-10-CM | POA: Diagnosis not present

## 2021-11-11 DIAGNOSIS — M25641 Stiffness of right hand, not elsewhere classified: Secondary | ICD-10-CM | POA: Diagnosis not present

## 2021-11-11 LAB — COMPREHENSIVE METABOLIC PANEL
ALT: 26 U/L (ref 0–35)
AST: 28 U/L (ref 0–37)
Albumin: 4.7 g/dL (ref 3.5–5.2)
Alkaline Phosphatase: 92 U/L (ref 39–117)
BUN: 18 mg/dL (ref 6–23)
CO2: 30 mEq/L (ref 19–32)
Calcium: 9.8 mg/dL (ref 8.4–10.5)
Chloride: 100 mEq/L (ref 96–112)
Creatinine, Ser: 0.82 mg/dL (ref 0.40–1.20)
GFR: 75.46 mL/min (ref >60.00)
Glucose, Bld: 82 mg/dL (ref 70–99)
Potassium: 4.1 mEq/L (ref 3.5–5.1)
Sodium: 139 mEq/L (ref 135–145)
Total Bilirubin: 0.7 mg/dL (ref 0.2–1.2)
Total Protein: 7.1 g/dL (ref 6.0–8.3)

## 2021-11-11 LAB — LIPID PANEL
Cholesterol: 269 mg/dL — ABNORMAL HIGH (ref 0–200)
HDL: 97.4 mg/dL (ref >39.00)
LDL Cholesterol: 162 mg/dL — ABNORMAL HIGH (ref 0–99)
NonHDL: 172.06
Total CHOL/HDL Ratio: 3
Triglycerides: 51 mg/dL (ref 0.0–149.0)
VLDL: 10.2 mg/dL (ref 0.0–40.0)

## 2021-11-15 DIAGNOSIS — M25641 Stiffness of right hand, not elsewhere classified: Secondary | ICD-10-CM | POA: Diagnosis not present

## 2021-11-15 DIAGNOSIS — Z4889 Encounter for other specified surgical aftercare: Secondary | ICD-10-CM | POA: Diagnosis not present

## 2021-11-22 DIAGNOSIS — M25641 Stiffness of right hand, not elsewhere classified: Secondary | ICD-10-CM | POA: Diagnosis not present

## 2021-11-22 DIAGNOSIS — R2231 Localized swelling, mass and lump, right upper limb: Secondary | ICD-10-CM | POA: Diagnosis not present

## 2021-11-22 DIAGNOSIS — R52 Pain, unspecified: Secondary | ICD-10-CM | POA: Diagnosis not present

## 2021-12-06 DIAGNOSIS — M25641 Stiffness of right hand, not elsewhere classified: Secondary | ICD-10-CM | POA: Diagnosis not present

## 2021-12-06 DIAGNOSIS — R52 Pain, unspecified: Secondary | ICD-10-CM | POA: Diagnosis not present

## 2021-12-06 DIAGNOSIS — R2231 Localized swelling, mass and lump, right upper limb: Secondary | ICD-10-CM | POA: Diagnosis not present

## 2021-12-07 ENCOUNTER — Encounter: Payer: Self-pay | Admitting: Family Medicine

## 2021-12-07 DIAGNOSIS — Z0000 Encounter for general adult medical examination without abnormal findings: Secondary | ICD-10-CM

## 2021-12-07 DIAGNOSIS — E2839 Other primary ovarian failure: Secondary | ICD-10-CM

## 2021-12-07 NOTE — Progress Notes (Signed)
Cardiology Office Note:   Date:  12/08/2021  NAME:  Jennifer Stokes    MRN: [id number] DOB:  09-Nov-1955   PCP:  Jennifer Schultz, DO  Cardiologist:  None  Electrophysiologist:  None   Referring MD: Jennifer Stokes, Grayling Congress, *   Chief Complaint  Patient presents with   New Patient (Initial Visit)           History of Present Illness:   Jennifer Stokes is a 66 y.o. female with a hx of arthritis who is being seen today for the evaluation of family history of heart disease at the request of Jennifer Schultz, DO.  She reports has had several for members with heart disease.  Her father had a heart attack in his later years.  Mom had a pacemaker.  Also had maternal grandparents with pacemaker and arrhythmias.  She personally has never had a heart attack or stroke.  Recently completed [employer] screening.  May have minimal carotid plaque.  Denies any heart history personally.  EKG demonstrates sinus rhythm with no acute ischemic changes or evidence of infarction.  She reports no chest pain or trouble breathing.  No limitations.  Not exercising regularly.  She reports she watches her diet.  Body weight is normal.  Blood pressure is normal no medication.  LDL cholesterol elevated.  10-year ASCVD risk score is low at 3.5%.  We discussed calcium scoring as well as LP(a) testing.  She is interested.  Overall I believe she is low risk.  She does not smoke.  No alcohol or drug use is reported.  She has 3 children.  Just had her first grandchild.  Overall doing well without major symptoms.  Just requests more information about cardiovascular disease screening.  T chol 269, HDL 97, LDL 162, TG 51  Past Medical History: Past Medical History:  Diagnosis Date   ADD (attention deficit disorder)    Arthritis     Past Surgical History: Past Surgical History:  Procedure Laterality Date   BILATERAL SALPINGECTOMY Bilateral    COSMETIC SURGERY     EXCISION METACARPAL MASS Right 11/01/2021   Procedure:  EXCISION METACARPAL MASS RIGHT INDEX FINGER;  Surgeon: Jennifer Loa, MD;  Location: [facility];  Service: Orthopedics;  Laterality: Right;   TOTAL HIP ARTHROPLASTY Right 12/2020    Current Medications: Current Meds  Medication Sig   Methylphenidate HCl ER, PM, (JORNAY PM) 100 MG CP24 Jornay PM 100 mg capsule,delayed release,extended release sprinkle  TAKE 1 CAPSULE BY MOUTH AT BEDTIME   sertraline (ZOLOFT) 25 MG tablet TAKE 1 BY MOUTH DAILY     Allergies:    Codeine and Prednisone   Social History: Social History   Socioeconomic History   Marital status: Married    Spouse name: Not on file   Number of children: 3   Years of education: Not on file   Highest education level: Not on file  Occupational History   Occupation: Forensic scientist   Occupation: Optician, dispensing  Tobacco Use   Smoking status: Never   Smokeless tobacco: Never  Substance and Sexual Activity   Alcohol use: No   Drug use: No   Sexual activity: Yes    Partners: Male  Other Topics Concern   Not on file  Social History Narrative   Not on file   Social Determinants of Health   Financial Resource Strain: Not on file  Food Insecurity: Not on file  Transportation Needs: Not on file  Physical Activity: Not on file  Stress: Not on file  Social Connections: Not on file     Family History: The patient's family history includes Allergic rhinitis in her father and mother; Arrhythmia in her mother; Bipolar disorder in her son; Eczema in her daughter and mother; Heart disease in her father, maternal grandfather, and mother; Stroke in her paternal grandfather. There is no history of Diabetes, Hyperlipidemia, or Hypertension.  ROS:   All other ROS reviewed and negative. Pertinent positives noted in the HPI.     EKGs/Labs/Other Studies Reviewed:   The following studies were personally reviewed by me today:  EKG:  EKG is ordered today.  The ekg ordered today demonstrates normal sinus rhythm  heart rate 89, no acute ischemic changes or evidence of infarction, and was personally reviewed by me.   Recent Labs: 12/14/2020: Hemoglobin 14.0; Platelets 265.0; TSH 1.57 11/10/2021: ALT 26; BUN 18; Creatinine, Ser 0.82; Potassium 4.1; Sodium 139   Recent Lipid Panel    Component Value Date/Time   CHOL 269 (H) 11/10/2021 1604   TRIG 51.0 11/10/2021 1604   HDL 97.40 11/10/2021 1604   CHOLHDL 3 11/10/2021 1604   VLDL 10.2 11/10/2021 1604   LDLCALC 162 (H) 11/10/2021 1604   LDLDIRECT 88.5 10/31/2012 1040    Physical Exam:   VS:  BP 112/74 (BP Location: Left Arm, Patient Position: Sitting, Cuff Size: Normal)    Pulse 89    Ht 5\' 7"  (1.702 m)    Wt 156 lb (70.8 kg)    BMI 24.43 kg/m    Wt Readings from Last 3 Encounters:  12/08/21 156 lb (70.8 kg)  11/10/21 153 lb 12.8 oz (69.8 kg)  11/01/21 155 lb 10.3 oz (70.6 kg)    General: Well nourished, well developed, in no acute distress Head: Atraumatic, normal size  Eyes: PEERLA, EOMI  Neck: Supple, no JVD Endocrine: No thryomegaly Cardiac: Normal S1, S2; RRR; no murmurs, rubs, or gallops Lungs: Clear to auscultation bilaterally, no wheezing, rhonchi or rales  Abd: Soft, nontender, no hepatomegaly  Ext: No edema, pulses 2+ Musculoskeletal: No deformities, BUE and BLE strength normal and equal Skin: Warm and dry, no rashes   Neuro: Alert and oriented to person, place, time, and situation, CNII-XII grossly intact, no focal deficits  Psych: Normal mood and affect   ASSESSMENT:   Jennifer Stokes is a 66 y.o. female who presents for the following: 1. Family history of heart disease   2. Mixed hyperlipidemia     PLAN:   1. Family history of heart disease 2. Mixed hyperlipidemia -Family history of heart disease.  LDL cholesterol elevated.  HDL cholesterol very elevated which is positive.  Her 10-year ASCVD risk score is 3.5%.  Given family history of heart disease we will proceed with coronary calcium scoring for further restratification.   We will also check Lp(a).  If both of these tests are negative she is extremely low risk for future heart disease development.  Regardless would recommend proper diet and exercise.  She will see 78 back in 5 years and we can possibly repeat calcium scoring.  Again much of her recommendations will be based on the above testing.  Disposition: Return if symptoms worsen or fail to improve.  Medication Adjustments/Labs and Tests Ordered: Current medicines are reviewed at length with the patient today.  Concerns regarding medicines are outlined above.  Orders Placed This Encounter  Procedures   CT CARDIAC SCORING (SELF PAY ONLY)   Lipoprotein A (LPA)  EKG 12-Lead   No orders of the defined types were placed in this encounter.   Patient Instructions  Medication Instructions:  The current medical regimen is effective;  continue present plan and medications.  *If you need a refill on your cardiac medications before your next appointment, please call your pharmacy*   Lab Work: LPa today   If you have labs (blood work) drawn today and your tests are completely normal, you will receive your results only by: MyChart Message (if you have MyChart) OR A paper copy in the mail If you have any lab test that is abnormal or we need to change your treatment, we will call you to review the results.   Testing/Procedures: CALCIUM SCORE    Follow-Up: At [facility], you and your health needs are our priority.  As part of our continuing mission to provide you with exceptional heart care, we have created designated Provider Care Teams.  These Care Teams include your primary Cardiologist (physician) and Advanced Practice Providers (APPs -  Physician Assistants and Nurse Practitioners) who all work together to provide you with the care you need, when you need it.  We recommend signing up for the patient portal called "MyChart".  Sign up information is provided on this After Visit Summary.  MyChart is  used to connect with patients for Virtual Visits (Telemedicine).  Patients are able to view lab/test results, encounter notes, upcoming appointments, etc.  Non-urgent messages can be sent to your provider as well.   To learn more about what you can do with MyChart, go to [email or web address].    Your next appointment:   As needed  The format for your next appointment:   In Person  Provider:   Lennie Odor, MD       Signed, Lenna Gilford. Flora Lipps, MD, [facility]   [facility]  [address], [address] [place], [place] [address] [phone]  12/08/2021 10:32 AM

## 2021-12-08 ENCOUNTER — Encounter: Payer: Self-pay | Admitting: Cardiovascular Disease

## 2021-12-08 ENCOUNTER — Other Ambulatory Visit: Payer: Self-pay

## 2021-12-08 ENCOUNTER — Ambulatory Visit (INDEPENDENT_AMBULATORY_CARE_PROVIDER_SITE_OTHER): Payer: BC Managed Care – PPO | Admitting: Cardiovascular Disease

## 2021-12-08 VITALS — BP 112/74 | HR 89 | Ht 67.0 in | Wt 156.0 lb

## 2021-12-08 DIAGNOSIS — Z8249 Family history of ischemic heart disease and other diseases of the circulatory system: Secondary | ICD-10-CM

## 2021-12-08 DIAGNOSIS — E782 Mixed hyperlipidemia: Secondary | ICD-10-CM | POA: Diagnosis not present

## 2021-12-08 NOTE — Patient Instructions (Signed)
Medication Instructions:  The current medical regimen is effective;  continue present plan and medications.  *If you need a refill on your cardiac medications before your next appointment, please call your pharmacy*   Lab Work: LPa today   If you have labs (blood work) drawn today and your tests are completely normal, you will receive your results only by: MyChart Message (if you have MyChart) OR A paper copy in the mail If you have any lab test that is abnormal or we need to change your treatment, we will call you to review the results.   Testing/Procedures: CALCIUM SCORE    Follow-Up: At [facility], you and your health needs are our priority.  As part of our continuing mission to provide you with exceptional heart care, we have created designated Provider Care Teams.  These Care Teams include your primary Cardiologist (physician) and Advanced Practice Providers (APPs -  Physician Assistants and Nurse Practitioners) who all work together to provide you with the care you need, when you need it.  We recommend signing up for the patient portal called "MyChart".  Sign up information is provided on this After Visit Summary.  MyChart is used to connect with patients for Virtual Visits (Telemedicine).  Patients are able to view lab/test results, encounter notes, upcoming appointments, etc.  Non-urgent messages can be sent to your provider as well.   To learn more about what you can do with MyChart, go to [email or web address].    Your next appointment:   As needed  The format for your next appointment:   In Person  Provider:   Lennie Odor, MD

## 2021-12-09 LAB — LIPOPROTEIN A (LPA): Lipoprotein (a): 81.6 nmol/L — ABNORMAL HIGH (ref <75.0)

## 2021-12-09 MED ORDER — VITAMIN D (ERGOCALCIFEROL) 1.25 MG (50000 UNIT) PO CAPS: 50000.0000 [IU] | ORAL_CAPSULE | ORAL | 2 refills | Status: DC

## 2021-12-14 ENCOUNTER — Encounter (HOSPITAL_COMMUNITY): Payer: Self-pay

## 2021-12-15 ENCOUNTER — Encounter: Payer: BC Managed Care – PPO | Admitting: Family Medicine

## 2021-12-23 DIAGNOSIS — M25562 Pain in left knee: Secondary | ICD-10-CM | POA: Diagnosis not present

## 2021-12-23 DIAGNOSIS — M1611 Unilateral primary osteoarthritis, right hip: Secondary | ICD-10-CM | POA: Diagnosis not present

## 2022-01-02 DIAGNOSIS — R2231 Localized swelling, mass and lump, right upper limb: Secondary | ICD-10-CM | POA: Diagnosis not present

## 2022-01-02 DIAGNOSIS — M25641 Stiffness of right hand, not elsewhere classified: Secondary | ICD-10-CM | POA: Diagnosis not present

## 2022-01-02 DIAGNOSIS — R52 Pain, unspecified: Secondary | ICD-10-CM | POA: Diagnosis not present

## 2022-01-03 DIAGNOSIS — Z96641 Presence of right artificial hip joint: Secondary | ICD-10-CM | POA: Diagnosis not present

## 2022-01-03 DIAGNOSIS — M7601 Gluteal tendinitis, right hip: Secondary | ICD-10-CM | POA: Diagnosis not present

## 2022-01-03 DIAGNOSIS — R269 Unspecified abnormalities of gait and mobility: Secondary | ICD-10-CM | POA: Diagnosis not present

## 2022-01-03 DIAGNOSIS — M6281 Muscle weakness (generalized): Secondary | ICD-10-CM | POA: Diagnosis not present

## 2022-01-12 ENCOUNTER — Telehealth (HOSPITAL_BASED_OUTPATIENT_CLINIC_OR_DEPARTMENT_OTHER): Payer: Self-pay

## 2022-01-18 ENCOUNTER — Ambulatory Visit (INDEPENDENT_AMBULATORY_CARE_PROVIDER_SITE_OTHER)
Admission: RE | Admit: 2022-01-18 | Discharge: 2022-01-18 | Disposition: A | Discharge status: Home / Self Care | Payer: Self-pay | Source: Ambulatory Visit | Attending: Cardiovascular Disease | Admitting: Cardiovascular Disease

## 2022-01-18 DIAGNOSIS — E782 Mixed hyperlipidemia: Secondary | ICD-10-CM

## 2022-01-18 DIAGNOSIS — R2231 Localized swelling, mass and lump, right upper limb: Secondary | ICD-10-CM | POA: Diagnosis not present

## 2022-01-18 DIAGNOSIS — R52 Pain, unspecified: Secondary | ICD-10-CM | POA: Diagnosis not present

## 2022-01-18 DIAGNOSIS — M25641 Stiffness of right hand, not elsewhere classified: Secondary | ICD-10-CM | POA: Diagnosis not present

## 2022-01-19 DIAGNOSIS — M6281 Muscle weakness (generalized): Secondary | ICD-10-CM | POA: Diagnosis not present

## 2022-01-19 DIAGNOSIS — Z96641 Presence of right artificial hip joint: Secondary | ICD-10-CM | POA: Diagnosis not present

## 2022-01-19 DIAGNOSIS — M7601 Gluteal tendinitis, right hip: Secondary | ICD-10-CM | POA: Diagnosis not present

## 2022-01-19 DIAGNOSIS — R269 Unspecified abnormalities of gait and mobility: Secondary | ICD-10-CM | POA: Diagnosis not present

## 2022-01-23 DIAGNOSIS — M7601 Gluteal tendinitis, right hip: Secondary | ICD-10-CM | POA: Diagnosis not present

## 2022-01-23 DIAGNOSIS — R269 Unspecified abnormalities of gait and mobility: Secondary | ICD-10-CM | POA: Diagnosis not present

## 2022-01-23 DIAGNOSIS — M6281 Muscle weakness (generalized): Secondary | ICD-10-CM | POA: Diagnosis not present

## 2022-01-23 DIAGNOSIS — Z96641 Presence of right artificial hip joint: Secondary | ICD-10-CM | POA: Diagnosis not present

## 2022-01-24 DIAGNOSIS — R52 Pain, unspecified: Secondary | ICD-10-CM | POA: Diagnosis not present

## 2022-01-24 DIAGNOSIS — R2231 Localized swelling, mass and lump, right upper limb: Secondary | ICD-10-CM | POA: Diagnosis not present

## 2022-01-24 DIAGNOSIS — R29898 Other symptoms and signs involving the musculoskeletal system: Secondary | ICD-10-CM | POA: Diagnosis not present

## 2022-01-24 DIAGNOSIS — M25641 Stiffness of right hand, not elsewhere classified: Secondary | ICD-10-CM | POA: Diagnosis not present

## 2022-02-02 ENCOUNTER — Encounter: Payer: Self-pay | Admitting: Family Medicine

## 2022-02-02 ENCOUNTER — Ambulatory Visit (INDEPENDENT_AMBULATORY_CARE_PROVIDER_SITE_OTHER): Payer: BC Managed Care – PPO | Admitting: Family Medicine

## 2022-02-02 VITALS — BP 118/72 | HR 96 | Temp 98.6°F | Resp 18 | Ht 67.0 in | Wt 157.2 lb

## 2022-02-02 DIAGNOSIS — R32 Unspecified urinary incontinence: Secondary | ICD-10-CM | POA: Diagnosis not present

## 2022-02-02 DIAGNOSIS — Z23 Encounter for immunization: Secondary | ICD-10-CM | POA: Diagnosis not present

## 2022-02-02 DIAGNOSIS — R911 Solitary pulmonary nodule: Secondary | ICD-10-CM | POA: Diagnosis not present

## 2022-02-02 DIAGNOSIS — F988 Other specified behavioral and emotional disorders with onset usually occurring in childhood and adolescence: Secondary | ICD-10-CM

## 2022-02-02 DIAGNOSIS — Z0000 Encounter for general adult medical examination without abnormal findings: Secondary | ICD-10-CM | POA: Diagnosis not present

## 2022-02-02 MED ORDER — LISDEXAMFETAMINE DIMESYLATE 30 MG PO CAPS: 30.0000 mg | ORAL_CAPSULE | Freq: Every day | ORAL | 0 refills | Status: DC

## 2022-02-02 MED ORDER — SOLIFENACIN SUCCINATE 5 MG PO TABS: 5.0000 mg | ORAL_TABLET | Freq: Every day | ORAL | 1 refills | Status: DC

## 2022-02-02 NOTE — Progress Notes (Signed)
Subjective:  ?  ? Jennifer Stokes is a 66 y.o. female and is here for a comprehensive physical exam. The patient reports no problems. ? ?Social History  ? ?Socioeconomic History  ? Marital status: Married  ?  Spouse name: Not on file  ? Number of children: 3  ? Years of education: Not on file  ? Highest education level: Not on file  ?Occupational History  ? Occupation: Forensic scientist  ? Occupation: Optician, dispensing  ?Tobacco Use  ? Smoking status: Never  ? Smokeless tobacco: Never  ?Substance and Sexual Activity  ? Alcohol use: No  ? Drug use: No  ? Sexual activity: Yes  ?  Partners: Male  ?Other Topics Concern  ? Not on file  ?Social History Narrative  ? Not on file  ? ?Social Determinants of Health  ? ?Financial Resource Strain: Not on file  ?Food Insecurity: Not on file  ?Transportation Needs: Not on file  ?Physical Activity: Not on file  ?Stress: Not on file  ?Social Connections: Not on file  ?Intimate Partner Violence: Not on file  ? ?Health Maintenance  ?Topic Date Due  ? PAP SMEAR-Modifier  04/25/2021  ? COVID-19 Vaccine (5 - Booster for Moderna series) 07/09/2021  ? MAMMOGRAM  04/28/2022  ? INFLUENZA VACCINE  05/16/2022  ? COLONOSCOPY (Pts 45-71yrs Insurance coverage will need to be confirmed)  03/26/2031  ? TETANUS/TDAP  02/03/2032  ? Hepatitis C Screening  Completed  ? Zoster Vaccines- Shingrix  Completed  ? HIV Screening  Addressed  ? HPV VACCINES  Aged Out  ? ? ?The following portions of the patient's history were reviewed and updated as appropriate: She  has a past medical history of ADD (attention deficit disorder) and Arthritis. ?She does not have any pertinent problems on file. ?She  has a past surgical history that includes Cosmetic surgery; Total hip arthroplasty (Right, 12/2020); Bilateral salpingectomy (Bilateral); and Excision metacarpal mass (Right, 11/01/2021). ?Her family history includes Allergic rhinitis in her father and mother; Arrhythmia in her mother; Bipolar disorder in her son; Eczema  in her daughter and mother; Heart disease in her father, maternal grandfather, and mother; Stroke in her paternal grandfather. ?She  reports that she has never smoked. She has never used smokeless tobacco. She reports that she does not drink alcohol and does not use drugs. ?She has a current medication list which includes the following prescription(s): lisdexamfetamine, methylphenidate, sertraline, solifenacin, and vitamin d (ergocalciferol). ?Current Outpatient Medications on File Prior to Visit  ?Medication Sig Dispense Refill  ? methylphenidate (RITALIN [place]) 40 MG 24 hr capsule Take 40 mg by mouth daily.    ? sertraline (ZOLOFT) 25 MG tablet TAKE 1 BY MOUTH DAILY 90 tablet 3  ? Vitamin D, Ergocalciferol, (DRISDOL) 1.25 MG (50000 UNIT) CAPS capsule Take 1 capsule (50,000 Units total) by mouth every 7 (seven) days. 12 capsule 2  ? ?No current facility-administered medications on file prior to visit.  ? ?She is allergic to codeine and prednisone.. ? ?Review of Systems ?Review of Systems  ?Constitutional: Negative for activity change, appetite change and fatigue.  ?HENT: Negative for hearing loss, congestion, tinnitus and ear discharge.  dentist q46m ?Eyes: Negative for visual disturbance (see optho q1y -- vision corrected to 20/20 with glasses).  ?Respiratory: Negative for cough, chest tightness and shortness of breath.   ?Cardiovascular: Negative for chest pain, palpitations and leg swelling.  ?Gastrointestinal: Negative for abdominal pain, diarrhea, constipation and abdominal distention.  ?Genitourinary: Negative for urgency, frequency, decreased urine  volume and difficulty urinating.  ?Musculoskeletal: Negative for back pain, arthralgias and gait problem.  ?Skin: Negative for color change, pallor and rash.  ?Neurological: Negative for dizziness, light-headedness, numbness and headaches.  ?Hematological: Negative for adenopathy. Does not bruise/bleed easily.  ?Psychiatric/Behavioral: Negative for suicidal ideas,  confusion, sleep disturbance, self-injury, dysphoric mood, decreased concentration and agitation.  ? ? ? ? ?Objective:  ? ? BP 118/72 (BP Location: Left Arm, Patient Position: Sitting, Cuff Size: Normal)   Pulse 96   Temp 98.6 ?F (37 ?C) (Oral)   Resp 18   Ht 5\' 7"  (1.702 m)   Wt 157 lb 3.2 oz (71.3 kg)   SpO2 96%   BMI 24.62 kg/m?  ?General appearance: alert, cooperative, appears stated age, and no distress ?Head: Normocephalic, without obvious abnormality, atraumatic ?Eyes: conjunctivae/corneas clear. PERRL, EOM's intact. Fundi benign. ?Ears: normal TM's and external ear canals both ears ?Nose: Nares normal. Septum midline. Mucosa normal. No drainage or sinus tenderness. ?Throat: lips, mucosa, and tongue normal; teeth and gums normal ?Neck: no adenopathy, no carotid bruit, no JVD, supple, symmetrical, trachea midline, and thyroid not enlarged, symmetric, no tenderness/mass/nodules ?Back: symmetric, no curvature. ROM normal. No CVA tenderness. ?Lungs: clear to auscultation bilaterally ?Heart: regular rate and rhythm, S1, S2 normal, no murmur, click, rub or gallop ?Abdomen: soft, non-tender; bowel sounds normal; no masses,  no organomegaly ?Extremities: extremities normal, atraumatic, no cyanosis or edema ?Pulses: 2+ and symmetric ?Skin: Skin color, texture, turgor normal. No rashes or lesions ?Lymph nodes: Cervical, supraclavicular, and axillary nodes normal. ?Neurologic: Alert and oriented X 3, normal strength and tone. Normal symmetric reflexes. Normal coordination and gait  ?  ?Assessment:  ? ? Healthy female exam.    ?  ?Plan:  ? ? Ghm utd ?Check labs  ?See After Visit Summary for Counseling Recommendations  ? ?1. Pulmonary nodule ?Pt requests to see pulm ?- Ambulatory referral to Pulmonology ? ?2. Need for tetanus booster ? ? ?- Tdap vaccine greater than or equal to 7yo IM ? ?3. Preventative health care ?See above  ?- CBC with Differential/Platelet ?- Comprehensive metabolic panel ?- Lipid panel ?-  TSH ? ?4. Incontinence in female ? ? ?- solifenacin (VESICARE) 5 MG tablet; Take 1 tablet (5 mg total) by mouth daily.  Dispense: 30 tablet; Refill: 1 ? ?5. Attention deficit disorder, unspecified hyperactivity presence ? ?Rto 6 months  ?- lisdexamfetamine (VYVANSE) 30 MG capsule; Take 1 capsule (30 mg total) by mouth daily.  Dispense: 30 capsule; Refill: 0 ? ? ?

## 2022-02-02 NOTE — Patient Instructions (Signed)

## 2022-02-03 ENCOUNTER — Telehealth: Payer: Self-pay

## 2022-02-03 NOTE — Telephone Encounter (Signed)
PA initiated via Covermymeds; KEY: [id number]. Awaiting determination.  ?

## 2022-02-06 NOTE — Telephone Encounter (Signed)
PA denied. Awaiting denial information.  °

## 2022-02-06 NOTE — Telephone Encounter (Signed)
PA denied.  ? ?Medication covered when one of the following:  ? ?-Member is being treated for a refractory psychiatric disorder (when Pt does not respond to the usual first,second,or third line medication). Member must be stable on requested medication or members condition is too serious to try other medications. Medical records must be sent in for review. ? ?-Member is under care of psychiatry or in consultaton with a psychiatrist who states that member can't use other generic formulary alternatives ? ?-Member has tried three generic formulary alternatives and they did not work, or the member is unable to take generic formulary alternatives. Pt has only tried methylphenidate IR/ER. Other alternatives are amphetamine/dextroamphetamine XR capsule, dexmethylphenidate ER capsules, etc. ? ?The medication may also be approved if member is being treated for binge-eating disorder.  ?

## 2022-02-07 ENCOUNTER — Other Ambulatory Visit: Payer: Self-pay | Admitting: Family Medicine

## 2022-02-07 DIAGNOSIS — F988 Other specified behavioral and emotional disorders with onset usually occurring in childhood and adolescence: Secondary | ICD-10-CM

## 2022-02-07 MED ORDER — METHYLPHENIDATE HCL ER (LA) 40 MG PO CP24: 40.0000 mg | ORAL_CAPSULE | Freq: Every day | ORAL | 0 refills | Status: DC

## 2022-02-10 ENCOUNTER — Other Ambulatory Visit: Payer: Self-pay

## 2022-02-10 NOTE — Telephone Encounter (Signed)
Pt called. LVM to return call. Will respond to patient's Mychart ?

## 2022-02-14 ENCOUNTER — Encounter: Payer: Self-pay | Admitting: *Deleted

## 2022-02-19 ENCOUNTER — Other Ambulatory Visit: Payer: Self-pay | Admitting: Family Medicine

## 2022-02-19 ENCOUNTER — Encounter: Payer: Self-pay | Admitting: Family Medicine

## 2022-02-19 DIAGNOSIS — F988 Other specified behavioral and emotional disorders with onset usually occurring in childhood and adolescence: Secondary | ICD-10-CM

## 2022-02-20 MED ORDER — SERTRALINE HCL 25 MG PO TABS: ORAL_TABLET | ORAL | 3 refills | Status: DC

## 2022-02-20 NOTE — Telephone Encounter (Signed)
Please advise. Zoloft is on patients medication but hasn't been refilled by you since 2016. ?

## 2022-02-24 ENCOUNTER — Encounter: Payer: Self-pay | Admitting: Family Medicine

## 2022-02-24 NOTE — Telephone Encounter (Signed)
Pt just seen on 04/20 for CPE. Will she need a pre-op appt?  ?

## 2022-02-25 ENCOUNTER — Other Ambulatory Visit: Payer: Self-pay | Admitting: Family Medicine

## 2022-02-25 DIAGNOSIS — R32 Unspecified urinary incontinence: Secondary | ICD-10-CM

## 2022-02-27 NOTE — Telephone Encounter (Signed)
[facility] states this fax has not been received, can be sent to [phone].  ?

## 2022-02-27 NOTE — Telephone Encounter (Signed)
Resent

## 2022-03-02 ENCOUNTER — Other Ambulatory Visit: Payer: Self-pay | Admitting: Family Medicine

## 2022-03-02 ENCOUNTER — Encounter: Payer: Self-pay | Admitting: Family Medicine

## 2022-03-02 DIAGNOSIS — M1990 Unspecified osteoarthritis, unspecified site: Secondary | ICD-10-CM

## 2022-03-09 ENCOUNTER — Other Ambulatory Visit: Payer: Self-pay | Admitting: Family Medicine

## 2022-03-09 DIAGNOSIS — F988 Other specified behavioral and emotional disorders with onset usually occurring in childhood and adolescence: Secondary | ICD-10-CM

## 2022-03-09 MED ORDER — METHYLPHENIDATE HCL ER (LA) 40 MG PO CP24: 40.0000 mg | ORAL_CAPSULE | Freq: Every day | ORAL | 0 refills | Status: DC

## 2022-03-09 NOTE — Telephone Encounter (Signed)
Requesting: Ritalin LA 40mg  Contract: none UDS: none Last Visit: 02/02/22 Next Visit:none Last Refill: 02/07/22   Please Advise

## 2022-03-16 HISTORY — PX: REPLACEMENT TOTAL KNEE: SUR1224

## 2022-03-18 ENCOUNTER — Other Ambulatory Visit: Payer: Self-pay | Admitting: Family Medicine

## 2022-03-18 DIAGNOSIS — R32 Unspecified urinary incontinence: Secondary | ICD-10-CM

## 2022-03-23 ENCOUNTER — Encounter: Payer: Self-pay | Admitting: Family Medicine

## 2022-04-25 ENCOUNTER — Ambulatory Visit (INDEPENDENT_AMBULATORY_CARE_PROVIDER_SITE_OTHER): Payer: Medicare Other | Admitting: Emergency Medicine

## 2022-04-25 ENCOUNTER — Encounter: Payer: Self-pay | Admitting: Emergency Medicine

## 2022-04-25 DIAGNOSIS — R918 Other nonspecific abnormal finding of lung field: Secondary | ICD-10-CM

## 2022-04-25 NOTE — Addendum Note (Signed)
Addended by: Dorisann Frames R on: 04/25/2022 12:10 PM   Modules accepted: Orders

## 2022-04-25 NOTE — Assessment & Plan Note (Signed)
Small 3 mm pulmonary nodules and a low risk patient.  Could argue due to defer any follow-up given the statistical likelihood that these are benign.  That said we have not imaged her lung apices.  I think would be reasonable to perform a 66-month dedicated CT chest.  If no suspicious nodules, no interval change in her existing nodules then she should not need any further follow-up.  If any nodules with characteristics that are suspicious then we will arrange for appropriate diagnostic work-up.

## 2022-04-25 NOTE — Patient Instructions (Signed)
We reviewed your cardiac calcium scoring CT scan of the chest today. We will perform a repeat CT scan of the chest in October 2023.   Follow with Dr. Delton Coombes in October after your CT scan so we can review the results together.

## 2022-04-25 NOTE — Progress Notes (Signed)
Subjective:    Patient ID: Jennifer Stokes, female    DOB: January 16, 1956, 66 y.o.   MRN: [id number]  HPI 66 year old never smoker with a history of osteoarthritis, ADD, hyperlipidemia, allergic rhinitis with chronic sinusitis.  She is here today to discuss an abnormal CT scan of the chest. She has a strong family hx CAD, so underwent cardiac calcium scoring.  She denies any respiratory symptoms.  No cough, no dyspnea.  She just underwent left knee surgery  Cardiac scoring CT chest/5/23 reviewed by me shows a 3 mm left lower lobe pulmonary nodule, no adenopathy.   Review of Systems As per HPI  Past Medical History:  Diagnosis Date   ADD (attention deficit disorder)    Arthritis      Family History  Problem Relation Age of Onset   Arrhythmia Mother    Eczema Mother    Allergic rhinitis Mother    Heart disease Mother    Allergic rhinitis Father    Heart disease Father    Heart disease Maternal Grandfather    Stroke Paternal Grandfather    Eczema Daughter    Bipolar disorder Son    Diabetes Neg Hx    Hyperlipidemia Neg Hx    Hypertension Neg Hx     No family hx lung CA  Social History   Socioeconomic History   Marital status: Married    Spouse name: Not on file   Number of children: 3   Years of education: Not on file   Highest education level: Not on file  Occupational History   Occupation: Forensic scientist   Occupation: Optician, dispensing  Tobacco Use   Smoking status: Never   Smokeless tobacco: Never  Substance and Sexual Activity   Alcohol use: No   Drug use: No   Sexual activity: Yes    Partners: Male  Other Topics Concern   Not on file  Social History Narrative   Not on file   Social Determinants of Health   Financial Resource Strain: Not on file  Food Insecurity: Not on file  Transportation Needs: Not on file  Physical Activity: Not on file  Stress: Not on file  Social Connections: Not on file  Intimate Partner Violence: Not on file    Second  hand smoke exposure.  Works in Associate Professor  No other inhaled exposures.  No mold exposures From [place], has lived in [place], [place], [place]  Allergies  Allergen Reactions   Codeine Other (See Comments)    "Wired"   Prednisone Other (See Comments)    "Wired"     Outpatient Medications Prior to Visit  Medication Sig Dispense Refill   trospium (SANCTURA) 20 MG tablet Take 1 tablet (20 mg total) by mouth at bedtime. 60 tablet 0   Vitamin D, Ergocalciferol, (DRISDOL) 1.25 MG (50000 UNIT) CAPS capsule Take 1 capsule (50,000 Units total) by mouth every 7 (seven) days. 12 capsule 2   methylphenidate (RITALIN LA) 40 MG 24 hr capsule Take 1 capsule (40 mg total) by mouth daily. 30 capsule 0   methylphenidate (RITALIN) 20 MG tablet Take 1 tablet (20 mg total) by mouth 2 (two) times daily. 180 tablet 0   sertraline (ZOLOFT) 25 MG tablet TAKE 1 BY MOUTH DAILY 90 tablet 3   No facility-administered medications prior to visit.        Objective:   Physical Exam Vitals:   04/25/22 1136  BP: 128/74  Pulse: 81  Temp: 98.1 F (36.7 C)  TempSrc: Oral  SpO2: 99%  Weight: 156 lb 6.4 oz (70.9 kg)  Height: 5\' 7"  (1.702 m)    Gen: Pleasant, well-nourished, in no distress,  normal affect  ENT: No lesions,  mouth clear,  oropharynx clear, no postnasal drip  Neck: No JVD, no stridor  Lungs: No use of accessory muscles, no crackles or wheezing on normal respiration, no wheeze on forced expiration  Cardiovascular: RRR, heart sounds normal, no murmur or gallops, no peripheral edema  Musculoskeletal: Healing vertical incision left knee  Neuro: alert, awake, non focal  Skin: Warm, no lesions or rash      Assessment & Plan:  Pulmonary nodules Small 3 mm pulmonary nodules and a low risk patient.  Could argue due to defer any follow-up given the statistical likelihood that these are benign.  That said we have not imaged her lung apices.  I think would be reasonable to perform a 40-month dedicated CT chest.   If no suspicious nodules, no interval change in her existing nodules then she should not need any further follow-up.  If any nodules with characteristics that are suspicious then we will arrange for appropriate diagnostic work-up.  8-month, MD, PhD 04/25/2022, 12:05 PM [facility] [phone] or if no answer before 7:00PM call [phone] For any issues after 7:00PM please call eLink [phone]

## 2022-04-28 ENCOUNTER — Other Ambulatory Visit: Payer: Self-pay | Admitting: Family Medicine

## 2022-04-28 DIAGNOSIS — Z1231 Encounter for screening mammogram for malignant neoplasm of breast: Secondary | ICD-10-CM

## 2022-05-01 ENCOUNTER — Ambulatory Visit
Admission: RE | Admit: 2022-05-01 | Discharge: 2022-05-01 | Disposition: A | Discharge status: Home / Self Care | Payer: Medicare Other | Source: Ambulatory Visit | Attending: Family Medicine | Admitting: Family Medicine

## 2022-05-01 DIAGNOSIS — Z1231 Encounter for screening mammogram for malignant neoplasm of breast: Secondary | ICD-10-CM

## 2022-05-19 ENCOUNTER — Other Ambulatory Visit: Payer: Self-pay | Admitting: Family Medicine

## 2022-05-19 DIAGNOSIS — R32 Unspecified urinary incontinence: Secondary | ICD-10-CM

## 2022-06-04 ENCOUNTER — Encounter: Payer: Self-pay | Admitting: Family Medicine

## 2022-07-27 ENCOUNTER — Ambulatory Visit (HOSPITAL_BASED_OUTPATIENT_CLINIC_OR_DEPARTMENT_OTHER)
Admission: RE | Admit: 2022-07-27 | Discharge: 2022-07-27 | Disposition: A | Discharge status: Home / Self Care | Payer: Medicare Other | Source: Ambulatory Visit | Attending: Emergency Medicine | Admitting: Emergency Medicine

## 2022-07-27 ENCOUNTER — Encounter (HOSPITAL_BASED_OUTPATIENT_CLINIC_OR_DEPARTMENT_OTHER): Payer: Self-pay

## 2022-07-27 DIAGNOSIS — R918 Other nonspecific abnormal finding of lung field: Secondary | ICD-10-CM | POA: Insufficient documentation

## 2022-07-31 ENCOUNTER — Telehealth: Payer: Self-pay | Admitting: *Deleted

## 2022-07-31 NOTE — Telephone Encounter (Signed)
Dr. Lamonte Sakai will you please review and results Super D CT completed on 07/27/22. Thank you.

## 2022-07-31 NOTE — Telephone Encounter (Signed)
Patient called back. Informed patient that we are able to see CT imaging in the system. Patient states she believes it is more of a heart issue and doesn't think she needs an appt. Informed patient our office would reach out with CT results.   Please advise on results.

## 2022-08-01 NOTE — Telephone Encounter (Signed)
Please let her know that I reviewed the scan and agree - she should not need pulm follow up unless there is a clinical change, or any other dedicated CT scans to follow pulmonary nodules.

## 2022-08-01 NOTE — Telephone Encounter (Signed)
Called and left detailed message per DPR that Dr Lamonte Sakai looked over scan and his recommendations and that if she had any questions to call the office. Nothing further needed

## 2022-08-12 ENCOUNTER — Other Ambulatory Visit: Payer: Self-pay | Admitting: Family Medicine

## 2022-08-22 NOTE — Progress Notes (Addendum)
Office Visit Note  Patient: Jennifer Stokes             Date of Birth: 12-Mar-1956           MRN: [id number]             PCP: Ludmila Held, DO Referring: Dorice Held, * Visit Date: 09/05/2022 Occupation: _0 @  Subjective:  Pain in joints  History of Present Illness: Jennifer Stokes is a 66 y.o. female seen in consultation per request of Dr. Marzetta Merino.  According to the patient she played tennis since she was a child.  She started experiencing joint pain about 25 years ago.  She states that she developed left knee meniscal tear when she was 66 years old.  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 Percell Miller.  She continues to have some discomfort in her right hip joint.  She states she was recently evaluated by Dr. Percell Miller who told her it was related to muscle weakness.  She underwent left total knee replacement in June 2023 and is gradually recovering from it.  She states she went to [place] recently and walked a lot.  She had some difficulty walking and used a cane.  She has been going to muscle activation treatment.  She finished physical therapy.  She states that she has degenerative disc disease of cervical spine and has been followed at [facility] where she has had injections in her cervical spine.  She has occasional arm pain.  Despite of multiple joint issues she plays tennis on a regular basis.  She denies any history of oral ulcers, nasal ulcers, malar rash, Raynaud's phenomenon or lymphadenopathy.  Besides her left knee joint none of the other joints are swollen.  There is no family history of autoimmune disease or psoriasis.  She is gravida 3, para 3, miscarriages 3.  Activities of Daily Living:  Patient reports morning stiffness for 10 minutes.   Patient Reports nocturnal pain.  Difficulty dressing/grooming: Denies Difficulty climbing stairs: Denies Difficulty getting out of chair: Denies Difficulty  using hands for taps, buttons, cutlery, and/or writing: Denies  Review of Systems  Constitutional:  Positive for fatigue.  HENT:  Negative for mouth sores and mouth dryness.   Eyes:  Positive for dryness.  Respiratory:  Negative for shortness of breath.   Cardiovascular:  Negative for chest pain and palpitations.  Gastrointestinal:  Positive for constipation. Negative for blood in stool and diarrhea.  Endocrine: Negative for increased urination.  Genitourinary:  Negative for involuntary urination.  Musculoskeletal:  Positive for joint pain, gait problem, joint pain, joint swelling, myalgias, muscle weakness, morning stiffness, muscle tenderness and myalgias.  Skin:  Negative for color change, rash, hair loss and sensitivity to sunlight.  Allergic/Immunologic: Negative for susceptible to infections.  Neurological:  Negative for dizziness and headaches.  Hematological:  Negative for swollen glands.  Psychiatric/Behavioral:  Negative for depressed mood and sleep disturbance. The patient is not nervous/anxious.     PMFS History:  Patient Active Problem List   Diagnosis Date Noted   Pulmonary nodules 04/25/2022   Allergic conjunctivitis of both eyes 06/29/2021   Irritant contact dermatitis due to plants, except food 06/29/2021   Seasonal allergies 06/29/2021   Recurrent sinusitis 06/29/2021   Right hip pain 12/14/2020   Exposure to COVID-19 virus 12/14/2020   Preventative health care 12/14/2020   Preop examination 12/14/2020   Heel pain 07/09/2018  Contusion, arm, upper 06/04/2018   Degenerative arthritis of left knee 05/08/2017   Residual foreign body in soft tissue 05/14/2016   HEAD TRAUMA 08/19/2009   DEPRESSION 12/18/2007    Past Medical History:  Diagnosis Date   ADD (attention deficit disorder)    Arthritis     Family History  Problem Relation Age of Onset   Arrhythmia Mother    Eczema Mother    Allergic rhinitis Mother    Heart disease Mother    Allergic rhinitis  Father    Heart disease Father    Heart disease Brother    Heart disease Maternal Grandfather    Stroke Paternal Grandfather    Healthy Daughter    Eczema Daughter    Healthy Daughter    Healthy Son    Diabetes Neg Hx    Hyperlipidemia Neg Hx    Hypertension Neg Hx    Past Surgical History:  Procedure Laterality Date   BILATERAL SALPINGECTOMY Bilateral    COSMETIC SURGERY     EXCISION METACARPAL MASS Right 11/01/2021   Procedure: EXCISION METACARPAL MASS RIGHT INDEX FINGER;  Surgeon: Leanora Cover, MD;  Location: [facility];  Service: Orthopedics;  Laterality: Right;   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   TOTAL HIP ARTHROPLASTY Right 12/2020   Social History   Social History Narrative   Not on file   Immunization History  Administered Date(s) Administered   Influenza Split 07/16/2013   Influenza Whole 06/16/2012   Influenza,inj,Quad PF,6+ Mos 07/10/2015, 06/20/2017, 07/09/2018   Influenza-Unspecified 05/17/2020   MMR 05/08/2018   Moderna Sars-Covid-2 Vaccination 10/28/2019, 11/26/2019, 08/31/2020, 05/14/2021   Tdap 10/03/2011, 02/02/2022   Zoster Recombinat (Shingrix) 04/14/2017, 08/04/2017   Zoster, Live 10/03/2011     Objective: Vital Signs: BP 123/76 (BP Location: Right Arm, Patient Position: Sitting, Cuff Size: Normal)   Pulse 86   Resp 15   Ht 5' 6.75" (1.695 m)   Wt 159 lb 3.2 oz (72.2 kg)   BMI 25.12 kg/m    Physical Exam Vitals and nursing note reviewed.  Constitutional:      Appearance: She is well-developed.  HENT:     Head: Normocephalic and atraumatic.  Eyes:     Conjunctiva/sclera: Conjunctivae normal.  Cardiovascular:     Rate and Rhythm: Normal rate and regular rhythm.     Heart sounds: Normal heart sounds.  Pulmonary:     Effort: Pulmonary effort is normal.     Breath sounds: Normal breath sounds.  Abdominal:     General: Bowel sounds are normal.     Palpations: Abdomen is soft.  Musculoskeletal:     Cervical back:  Normal range of motion.  Lymphadenopathy:     Cervical: No cervical adenopathy.  Skin:    General: Skin is warm and dry.     Capillary Refill: Capillary refill takes less than 2 seconds.  Neurological:     Mental Status: She is alert and oriented to person, place, and time.  Psychiatric:        Behavior: Behavior normal.      Musculoskeletal Exam: She had limited range of motion of the cervical spine.  Thoracic and lumbar spine were in good range of motion.  Shoulder joints, elbow joints, wrist joints, MCPs PIPs and DIPs been good range of motion with no synovitis.  Right hip joint was replaced and had limited range of motion.  Left hip joint was in good range of motion.  Left knee joint was replaced and had warmth  and swelling.  Right knee joint was in good range of motion.  She had mild pes cavus and hammertoes.  She had callus formation over bilateral fourth PIP joints.  She had mild tenderness over left plantar fascia.  CDAI Exam: CDAI Score: -- Patient Global: --; Provider Global: -- Swollen: --; Tender: -- Joint Exam 09/05/2022   No joint exam has been documented for this visit   There is currently no information documented on the homunculus. Go to the Rheumatology activity and complete the homunculus joint exam.  Investigation: No additional findings.  Imaging: No results found.  Recent Labs: Lab Results  Component Value Date   WBC 5.0 12/14/2020   HGB 14.0 12/14/2020   PLT 265.0 12/14/2020   NA 139 11/10/2021   K 4.1 11/10/2021   CL 100 11/10/2021   CO2 30 11/10/2021   GLUCOSE 82 11/10/2021   BUN 18 11/10/2021   CREATININE 0.82 11/10/2021   BILITOT 0.7 11/10/2021   ALKPHOS 92 11/10/2021   AST 28 11/10/2021   ALT 26 11/10/2021   PROT 7.1 11/10/2021   ALBUMIN 4.7 11/10/2021   CALCIUM 9.8 11/10/2021    Speciality Comments: No specialty comments available.  Procedures:  No procedures performed Allergies: Codeine and Prednisone   Assessment / Plan:      Visit Diagnoses: Polyarthralgia -patient complains of pain and discomfort in multiple joints.  She has not noticed any joint swelling except for her left knee which was recently replaced.  She played tennis all of her life.  She has discomfort in her joints which moves around.  She has not noticed joint inflammation.  No synovitis was noted on the examination.  No additional labs are necessary for work-up.  I advised her to contact me if she develops any increased joint pain or joint swelling.  Status post total hip replacement, right-she had right total hip replacement by Dr. Percell Miller in March 2022.  She had limited range of motion.  She continues to have some discomfort in her right hip joint.  She was recently evaluated by Dr. Percell Miller and was told it was due to muscle weakness.  Right hip pain  Status post total knee replacement, left-she had left total knee replacement in June 2023 by [place] Dr. Percell Miller.  She had good range of motion of her left knee joint.  She still has some warmth on palpation.  She states she is gradually recovering from her.  She has done physical therapy.  Natural anti-inflammatories and their side effects were discussed.  Gait instability-patient notices gait instability for last several years.  She uses cane sometimes when she goes to unlevel surfaces.  She has tried physical therapy.  She is also going to muscle activation treatment.  A handout on lower extremity exercises was given.  Hammertoes of both feet-she had hammertoes especially bilateral fourth with callus formation.  Use of orthotics and metatarsal pads were discussed.  She may consider seeing a podiatrist.  DDD (degenerative disc disease), cervical-I do not have x-rays available.  Patient states she had x-rays of the cervical spine in the past which showed degenerative disc disease.  She is followed at [facility] and gets injections in the cervical spine.  She had limited range of motion of the cervical  spine.  Osteopenia of multiple sites - Feb 25, 2021 T score -1.4 AP lumbar, BMD 1.017.  Use of calcium rich diet and vitamin D was discussed.  Her bone density is followed by her PCP.  Vitamin D  deficiency-she has been on vitamin D supplement.  I do not have a recent vitamin D level.  Pulmonary nodules-she is found to have pulmonary nodules while she had a calcium CT score.  She was evaluated by Dr. Lamonte Sakai.  I reviewed Dr. Agustina Caroli note from April 25, 2022.  According to his note the pulmonary nodules were benign.  Recurrent sinusitis  Allergic conjunctivitis of both eyes  Attention deficit disorder-she is on Vyvanse.  Dyslipidemia-her LDL is elevated.  Advised her to discuss treatment plan with her PCP or cardiologist.  Orders: No orders of the defined types were placed in this encounter.  No orders of the defined types were placed in this encounter.   Face-to-face time spent with patient was 45 minutes. Greater than 50% of time was spent in counseling and coordination of care.  Follow-Up Instructions: Return if symptoms worsen or fail to improve, for OA.   Bo Merino, MD  Note - This record has been created using Editor, commissioning.  Chart creation errors have been sought, but may not always  have been located. Such creation errors do not reflect on  the standard of medical care.

## 2022-09-05 ENCOUNTER — Ambulatory Visit: Payer: Medicare Other | Attending: Rheumatology | Admitting: Rheumatology

## 2022-09-05 ENCOUNTER — Encounter: Payer: Self-pay | Admitting: Rheumatology

## 2022-09-05 VITALS — BP 123/76 | HR 86 | Resp 15 | Ht 66.75 in | Wt 159.2 lb

## 2022-09-05 DIAGNOSIS — M8589 Other specified disorders of bone density and structure, multiple sites: Secondary | ICD-10-CM | POA: Diagnosis present

## 2022-09-05 DIAGNOSIS — Z96641 Presence of right artificial hip joint: Secondary | ICD-10-CM | POA: Diagnosis present

## 2022-09-05 DIAGNOSIS — M1990 Unspecified osteoarthritis, unspecified site: Secondary | ICD-10-CM

## 2022-09-05 DIAGNOSIS — E785 Hyperlipidemia, unspecified: Secondary | ICD-10-CM | POA: Insufficient documentation

## 2022-09-05 DIAGNOSIS — H1013 Acute atopic conjunctivitis, bilateral: Secondary | ICD-10-CM | POA: Diagnosis present

## 2022-09-05 DIAGNOSIS — E559 Vitamin D deficiency, unspecified: Secondary | ICD-10-CM | POA: Insufficient documentation

## 2022-09-05 DIAGNOSIS — M2041 Other hammer toe(s) (acquired), right foot: Secondary | ICD-10-CM | POA: Diagnosis present

## 2022-09-05 DIAGNOSIS — R2681 Unsteadiness on feet: Secondary | ICD-10-CM | POA: Insufficient documentation

## 2022-09-05 DIAGNOSIS — M2042 Other hammer toe(s) (acquired), left foot: Secondary | ICD-10-CM | POA: Diagnosis present

## 2022-09-05 DIAGNOSIS — R918 Other nonspecific abnormal finding of lung field: Secondary | ICD-10-CM | POA: Insufficient documentation

## 2022-09-05 DIAGNOSIS — J329 Chronic sinusitis, unspecified: Secondary | ICD-10-CM | POA: Diagnosis present

## 2022-09-05 DIAGNOSIS — M5030 Other cervical disc degeneration, unspecified cervical region: Secondary | ICD-10-CM | POA: Diagnosis present

## 2022-09-05 DIAGNOSIS — Z96652 Presence of left artificial knee joint: Secondary | ICD-10-CM | POA: Insufficient documentation

## 2022-09-05 DIAGNOSIS — M2550 Pain in unspecified joint: Secondary | ICD-10-CM | POA: Insufficient documentation

## 2022-09-05 DIAGNOSIS — Z8659 Personal history of other mental and behavioral disorders: Secondary | ICD-10-CM

## 2022-09-05 DIAGNOSIS — F988 Other specified behavioral and emotional disorders with onset usually occurring in childhood and adolescence: Secondary | ICD-10-CM | POA: Diagnosis present

## 2022-09-05 DIAGNOSIS — M25551 Pain in right hip: Secondary | ICD-10-CM | POA: Diagnosis present

## 2022-09-05 DIAGNOSIS — M1712 Unilateral primary osteoarthritis, left knee: Secondary | ICD-10-CM

## 2022-09-05 NOTE — Patient Instructions (Signed)

## 2022-09-08 ENCOUNTER — Telehealth: Payer: Medicare Other | Admitting: Family Medicine

## 2022-09-08 DIAGNOSIS — J40 Bronchitis, not specified as acute or chronic: Secondary | ICD-10-CM | POA: Diagnosis not present

## 2022-09-08 MED ORDER — AZITHROMYCIN 250 MG PO TABS: ORAL_TABLET | ORAL | 0 refills | Status: AC

## 2022-09-08 MED ORDER — BENZONATATE 200 MG PO CAPS: 200.0000 mg | ORAL_CAPSULE | Freq: Two times a day (BID) | ORAL | 0 refills | Status: DC | PRN

## 2022-09-08 NOTE — Progress Notes (Signed)
Virtual Visit Consent   Jennifer Stokes, you are scheduled for a virtual visit with a [facility] provider today. Just as with appointments in the office, your consent must be obtained to participate. Your consent will be active for this visit and any virtual visit you may have with one of our providers in the next 365 days. If you have a MyChart account, a copy of this consent can be sent to you electronically.  As this is a virtual visit, video technology does not allow for your provider to perform a traditional examination. This may limit your provider's ability to fully assess your condition. If your provider identifies any concerns that need to be evaluated in person or the need to arrange testing (such as labs, EKG, etc.), we will make arrangements to do so. Although advances in technology are sophisticated, we cannot ensure that it will always work on either your end or our end. If the connection with a video visit is poor, the visit may have to be switched to a telephone visit. With either a video or telephone visit, we are not always able to ensure that we have a secure connection.  By engaging in this virtual visit, you consent to the provision of healthcare and authorize for your insurance to be billed (if applicable) for the services provided during this visit. Depending on your insurance coverage, you may receive a charge related to this service.  I need to obtain your verbal consent now. Are you willing to proceed with your visit today? Jennifer Stokes has provided verbal consent on 09/08/2022 for a virtual visit (video or telephone). Georgana Curio, FNP  Date: 09/08/2022 8:41 AM  Virtual Visit via Video Note   I, Georgana Curio, connected with  Jennifer Stokes  ([id number], 1955/11/06) on 09/08/22 at  8:45 AM EST by a video-enabled telemedicine application and verified that I am speaking with the correct person using two identifiers.  Location: Patient: Virtual Visit Location Patient: Home Provider:  Virtual Visit Location Provider: Home Office   I discussed the limitations of evaluation and management by telemedicine and the availability of in person appointments. The patient expressed understanding and agreed to proceed.    History of Present Illness: Jennifer Stokes is a 66 y.o. who identifies as a female who was assigned female at birth, and is being seen today for cough, loose mucus but aunable to cough up anything for almost a week worsening. No fever, chills or body aches. No sinus problems, sore throat or ear pain.   HPI: HPI  Problems:  Patient Active Problem List   Diagnosis Date Noted   Pulmonary nodules 04/25/2022   Allergic conjunctivitis of both eyes 06/29/2021   Irritant contact dermatitis due to plants, except food 06/29/2021   Seasonal allergies 06/29/2021   Recurrent sinusitis 06/29/2021   Right hip pain 12/14/2020   Exposure to COVID-19 virus 12/14/2020   Preventative health care 12/14/2020   Preop examination 12/14/2020   Heel pain 07/09/2018   Contusion, arm, upper 06/04/2018   Degenerative arthritis of left knee 05/08/2017   Residual foreign body in soft tissue 05/14/2016   HEAD TRAUMA 08/19/2009   DEPRESSION 12/18/2007    Allergies:  Allergies  Allergen Reactions   Codeine Other (See Comments)    "Wired"   Prednisone Other (See Comments)    "Wired"   Medications:  Current Outpatient Medications:    azithromycin (ZITHROMAX) 250 MG tablet, Take 2 tablets on day 1, then 1 tablet daily on  days 2 through 5, Disp: 6 tablet, Rfl: 0   benzonatate (TESSALON) 200 MG capsule, Take 1 capsule (200 mg total) by mouth 2 (two) times daily as needed for cough., Disp: 20 capsule, Rfl: 0   sertraline (ZOLOFT) 50 MG tablet, Take 1 tablet by mouth daily., Disp: , Rfl:    Vitamin D, Ergocalciferol, (DRISDOL) 1.25 MG (50000 UNIT) CAPS capsule, TAKE 1 CAPSULE BY MOUTH EVERY WEEK (Patient not taking: Reported on 09/05/2022), Disp: 12 capsule, Rfl: 1   VYVANSE 50 MG capsule,  Take 50 mg by mouth every morning., Disp: , Rfl:   Observations/Objective: Patient is well-developed, well-nourished in no acute distress.  Resting comfortably  at home.  Head is normocephalic, atraumatic.  No labored breathing.  Speech is clear and coherent with logical content.  Patient is alert and oriented at baseline.  Congested cough  Assessment and Plan: 1. Bronchitis  Increase fluids, continue mucinex otc, cool mist humidifier at night, urgent care if sx worsen.   Follow Up Instructions: I discussed the assessment and treatment plan with the patient. The patient was provided an opportunity to ask questions and all were answered. The patient agreed with the plan and demonstrated an understanding of the instructions.  A copy of instructions were sent to the patient via MyChart unless otherwise noted below.     The patient was advised to call back or seek an in-person evaluation if the symptoms worsen or if the condition fails to improve as anticipated.  Time:  I spent 10 minutes with the patient via telehealth technology discussing the above problems/concerns.    Georgana Curio, FNP

## 2022-09-08 NOTE — Patient Instructions (Signed)

## 2022-09-12 ENCOUNTER — Ambulatory Visit: Payer: BC Managed Care – PPO | Admitting: Allergy & Immunology

## 2022-09-26 ENCOUNTER — Ambulatory Visit: Payer: BC Managed Care – PPO | Admitting: Rheumatology

## 2022-11-22 ENCOUNTER — Telehealth: Payer: Medicare Other | Admitting: Physician Assistant

## 2022-11-22 DIAGNOSIS — R591 Generalized enlarged lymph nodes: Secondary | ICD-10-CM | POA: Diagnosis not present

## 2022-11-22 DIAGNOSIS — B9689 Other specified bacterial agents as the cause of diseases classified elsewhere: Secondary | ICD-10-CM

## 2022-11-22 DIAGNOSIS — J069 Acute upper respiratory infection, unspecified: Secondary | ICD-10-CM | POA: Diagnosis not present

## 2022-11-22 MED ORDER — AMOXICILLIN-POT CLAVULANATE 875-125 MG PO TABS: 1.0000 | ORAL_TABLET | Freq: Two times a day (BID) | ORAL | 0 refills | Status: DC

## 2022-11-22 NOTE — Patient Instructions (Signed)
Jennifer Stokes, thank you for joining Mar Daring, PA-C for today's virtual visit.  While this provider is not your primary care provider (PCP), if your PCP is located in our provider database this encounter information will be shared with them immediately following your visit.   [facility] account gives you access to today's visit and all your visits, tests, and labs performed at [facility] " click here if you don't have a [facility] account or go to [email or web address]  Consent: (Patient) Jennifer Stokes provided verbal consent for this virtual visit at the beginning of the encounter.  Current Medications:  Current Outpatient Medications:    amoxicillin-clavulanate (AUGMENTIN) 875-125 MG tablet, Take 1 tablet by mouth 2 (two) times daily., Disp: 20 tablet, Rfl: 0   benzonatate (TESSALON) 200 MG capsule, Take 1 capsule (200 mg total) by mouth 2 (two) times daily as needed for cough., Disp: 20 capsule, Rfl: 0   sertraline (ZOLOFT) 50 MG tablet, Take 1 tablet by mouth daily., Disp: , Rfl:    Vitamin D, Ergocalciferol, (DRISDOL) 1.25 MG (50000 UNIT) CAPS capsule, TAKE 1 CAPSULE BY MOUTH EVERY WEEK (Patient not taking: Reported on 09/05/2022), Disp: 12 capsule, Rfl: 1   VYVANSE 50 MG capsule, Take 50 mg by mouth every morning., Disp: , Rfl:    Medications ordered in this encounter:  Meds ordered this encounter  Medications   amoxicillin-clavulanate (AUGMENTIN) 875-125 MG tablet    Sig: Take 1 tablet by mouth 2 (two) times daily.    Dispense:  20 tablet    Refill:  0    Order Specific Question:   Supervising Provider    Answer:   Chase Picket [id number]     *If you need refills on other medications prior to your next appointment, please contact your pharmacy*  Follow-Up: Call back or seek an in-person evaluation if the symptoms worsen or if the condition fails to improve as anticipated.  [place] [phone]  Other  Instructions  Lymphadenopathy  Lymphadenopathy means that your lymph glands are swollen or larger than normal. Lymph glands, also called lymph nodes, are collections of tissue that filter excess fluid, bacteria, viruses, and waste from your bloodstream. They are part of your body's disease-fighting system (immune system), which protects your body from germs. There may be different causes of lymphadenopathy, depending on where it is in your body. Some types go away on their own. Lymphadenopathy can occur anywhere that you have lymph glands, including these areas: Neck (cervical lymphadenopathy). Chest (mediastinal lymphadenopathy). Lungs (hilar lymphadenopathy). Underarms (axillary lymphadenopathy). Groin (inguinal lymphadenopathy). When your immune system responds to germs, infection-fighting cells and fluid build up in your lymph glands. This causes some swelling and enlargement. If the lymph nodes do not go back to normal size after you have an infection or disease, your health care provider may do tests. These tests help to monitor your condition and find the reason why the glands are still swollen and enlarged. Follow these instructions at home:  Get plenty of rest. Your health care provider may recommend over-the-counter medicines for pain. Take over-the-counter and prescription medicines only as told by your health care provider. If directed, apply heat to swollen lymph glands as often as told by your health care provider. Use the heat source that your health care provider recommends, such as a moist heat pack or a heating pad. Place a towel between your skin and the heat source. Leave the heat on for  20-30 minutes. Remove the heat if your skin turns bright red. This is especially important if you are unable to feel pain, heat, or cold. You may have a greater risk of getting burned. Check your affected lymph glands every day for changes. Check other lymph gland areas as told by your health  care provider. Check for changes such as: More swelling. Sudden increase in size. Redness or pain. Hardness. Keep all follow-up visits. This is important. Contact a health care provider if you have: Lymph glands that: Are still swollen after 2 weeks. Have suddenly gotten bigger or the swelling spreads. Are red, painful, or hard. Fluid leaking from the skin near an enlarged lymph gland. Problems with breathing. A fever, chills, or night sweats. Fatigue. A sore throat. Pain in your abdomen. Weight loss. Get help right away if you have: Severe pain. Chest pain. Shortness of breath. These symptoms may represent a serious problem that is an emergency. Do not wait to see if the symptoms will go away. Get medical help right away. Call your local emergency services (911 in the [place] Do not drive yourself to the hospital. Summary Lymphadenopathy means that your lymph glands are swollen or larger than normal. Lymph glands, also called lymph nodes, are collections of tissue that filter excess fluid, bacteria, viruses, and waste from the bloodstream. They are part of your body's disease-fighting system (immune system). Lymphadenopathy can occur anywhere that you have lymph glands. If the lymph nodes do not go back to normal size after you have an infection or disease, your health care provider may do tests to monitor your condition and find the reason why the glands are still swollen and enlarged. Check your affected lymph glands every day for changes. Check other lymph gland areas as told by your health care provider. This information is not intended to replace advice given to you by your health care provider. Make sure you discuss any questions you have with your health care provider. Document Revised: 07/28/2020 Document Reviewed: 07/28/2020 Elsevier Patient Education  [place].    If you have been instructed to have an in-person evaluation today at a local Urgent Care facility,  please use the link below. It will take you to a list of all of our available [facility], including address, phone number and hours of operation. Please do not delay care.  [facility]  If you or a family member do not have a primary care provider, use the link below to schedule a visit and establish care. When you choose a [facility] primary care physician or advanced practice provider, you gain a long-term partner in health. Find a Primary Care Provider  Learn more about [facility] in-office and virtual care options: [facility]

## 2022-11-22 NOTE — Progress Notes (Signed)
Virtual Visit Consent   Jennifer Stokes, you are scheduled for a virtual visit with a [facility] provider today. Just as with appointments in the office, your consent must be obtained to participate. Your consent will be active for this visit and any virtual visit you may have with one of our providers in the next 365 days. If you have a MyChart account, a copy of this consent can be sent to you electronically.  As this is a virtual visit, video technology does not allow for your provider to perform a traditional examination. This may limit your provider's ability to fully assess your condition. If your provider identifies any concerns that need to be evaluated in person or the need to arrange testing (such as labs, EKG, etc.), we will make arrangements to do so. Although advances in technology are sophisticated, we cannot ensure that it will always work on either your end or our end. If the connection with a video visit is poor, the visit may have to be switched to a telephone visit. With either a video or telephone visit, we are not always able to ensure that we have a secure connection.  By engaging in this virtual visit, you consent to the provision of healthcare and authorize for your insurance to be billed (if applicable) for the services provided during this visit. Depending on your insurance coverage, you may receive a charge related to this service.  I need to obtain your verbal consent now. Are you willing to proceed with your visit today? Jennifer Stokes has provided verbal consent on 11/22/2022 for a virtual visit (video or telephone). Mar Daring, PA-C  Date: 11/22/2022 6:43 PM  Virtual Visit via Video Note   IMar Daring, connected with  Jennifer Stokes  ([id number], [birth year]) on 11/22/22 at  6:30 PM EST by a video-enabled telemedicine application and verified that I am speaking with the correct person using two identifiers.  Location: Patient: Virtual Visit Location Patient:  Home Provider: Virtual Visit Location Provider: Home Office   I discussed the limitations of evaluation and management by telemedicine and the availability of in person appointments. The patient expressed understanding and agreed to proceed.    History of Present Illness: Jennifer Stokes is a 67 y.o. who identifies as a female who was assigned female at birth, and is being seen today for URI symptoms.  HPI: URI  This is a new problem. The current episode started 1 to 4 weeks ago. The problem has been gradually worsening. There has been no fever. Associated symptoms include congestion, coughing, headaches, rhinorrhea, sinus pain, a sore throat and swollen glands. Pertinent negatives include no ear pain or plugged ear sensation. Associated symptoms comments: Injected conjunctiva. She has tried acetaminophen, decongestant and increased fluids for the symptoms. The treatment provided no relief.   Negative at home covid 19   Problems:  Patient Active Problem List   Diagnosis Date Noted   Pulmonary nodules 04/25/2022   Allergic conjunctivitis of both eyes 06/29/2021   Irritant contact dermatitis due to plants, except food 06/29/2021   Seasonal allergies 06/29/2021   Recurrent sinusitis 06/29/2021   Right hip pain 12/14/2020   Exposure to COVID-19 virus 12/14/2020   Preventative health care 12/14/2020   Preop examination 12/14/2020   Heel pain 07/09/2018   Contusion, arm, upper 06/04/2018   Degenerative arthritis of left knee 05/08/2017   Residual foreign body in soft tissue 05/14/2016   HEAD TRAUMA 08/19/2009   DEPRESSION 12/18/2007  Allergies:  Allergies  Allergen Reactions   Codeine Other (See Comments)    "Wired"   Prednisone Other (See Comments)    "Wired"   Medications:  Current Outpatient Medications:    amoxicillin-clavulanate (AUGMENTIN) 875-125 MG tablet, Take 1 tablet by mouth 2 (two) times daily., Disp: 20 tablet, Rfl: 0   benzonatate (TESSALON) 200 MG capsule, Take 1  capsule (200 mg total) by mouth 2 (two) times daily as needed for cough., Disp: 20 capsule, Rfl: 0   sertraline (ZOLOFT) 50 MG tablet, Take 1 tablet by mouth daily., Disp: , Rfl:    Vitamin D, Ergocalciferol, (DRISDOL) 1.25 MG (50000 UNIT) CAPS capsule, TAKE 1 CAPSULE BY MOUTH EVERY WEEK (Patient not taking: Reported on 09/05/2022), Disp: 12 capsule, Rfl: 1   VYVANSE 50 MG capsule, Take 50 mg by mouth every morning., Disp: , Rfl:   Observations/Objective: Patient is well-developed, well-nourished in no acute distress.  Resting comfortably at home.  Head is normocephalic, atraumatic.  No labored breathing.  Speech is clear and coherent with logical content.  Patient is alert and oriented at baseline.  Injected conjunctiva bilaterally Left lower cervical chain into the left shoulder area there is an area of swelling noted in the area just anterior to the left upper trapezius muscle   Assessment and Plan: 1. Bacterial upper respiratory infection - amoxicillin-clavulanate (AUGMENTIN) 875-125 MG tablet; Take 1 tablet by mouth 2 (two) times daily.  Dispense: 20 tablet; Refill: 0  2. Lymphadenopathy  - Worsening symptoms that have not responded to OTC medications and now with lymphadenopathy and conjunctivitis - Will give Augmentin - Continue allergy medications.  - Steam and humidifier can help - Stay well hydrated and get plenty of rest.  - Seek in person evaluation if no symptom improvement or if symptoms worsen; discussed importance to follow up lymph node if not responding to antibiotics to have evaluated to determine if reactive or other causes   Follow Up Instructions: I discussed the assessment and treatment plan with the patient. The patient was provided an opportunity to ask questions and all were answered. The patient agreed with the plan and demonstrated an understanding of the instructions.  A copy of instructions were sent to the patient via MyChart unless otherwise noted below.     The patient was advised to call back or seek an in-person evaluation if the symptoms worsen or if the condition fails to improve as anticipated.  Time:  I spent 12 minutes with the patient via telehealth technology discussing the above problems/concerns.    Mar Daring, PA-C

## 2022-12-05 ENCOUNTER — Other Ambulatory Visit: Payer: Self-pay | Admitting: Physician Assistant

## 2022-12-05 DIAGNOSIS — R599 Enlarged lymph nodes, unspecified: Secondary | ICD-10-CM

## 2022-12-13 ENCOUNTER — Ambulatory Visit
Admission: RE | Admit: 2022-12-13 | Discharge: 2022-12-13 | Disposition: A | Discharge status: Home / Self Care | Payer: Medicare Other | Source: Ambulatory Visit | Attending: Physician Assistant | Admitting: Physician Assistant

## 2022-12-13 DIAGNOSIS — R599 Enlarged lymph nodes, unspecified: Secondary | ICD-10-CM

## 2022-12-13 MED ORDER — IOPAMIDOL (ISOVUE-300) INJECTION 61%
75.0000 mL | Freq: Once | INTRAVENOUS | Status: AC | PRN
  Administered 2022-12-13: 75 mL via INTRAVENOUS

## 2023-02-02 ENCOUNTER — Telehealth: Payer: Medicare Other | Admitting: Physician Assistant

## 2023-02-02 ENCOUNTER — Other Ambulatory Visit: Payer: Self-pay | Admitting: Family Medicine

## 2023-02-02 DIAGNOSIS — J019 Acute sinusitis, unspecified: Secondary | ICD-10-CM

## 2023-02-02 DIAGNOSIS — B9789 Other viral agents as the cause of diseases classified elsewhere: Secondary | ICD-10-CM | POA: Diagnosis not present

## 2023-02-02 MED ORDER — IPRATROPIUM BROMIDE 0.03 % NA SOLN: 2.0000 | Freq: Three times a day (TID) | NASAL | Status: DC

## 2023-02-02 NOTE — Progress Notes (Signed)
E-Visit for Sinus Problems  We are sorry that you are not feeling well.  Here is how we plan to help!  Based on what you have shared with me it looks like you have sinusitis.  Sinusitis is inflammation and infection in the sinus cavities of the head.  Based on your presentation I believe you most likely have Acute Viral Sinusitis.This is an infection most likely caused by a virus. There is not specific treatment for viral sinusitis other than to help you with the symptoms until the infection runs its course.  If symptoms last longer than 7-10 days may consider antibiotic. You may use an oral decongestant such as Mucinex D or if you have glaucoma or high blood pressure use plain Mucinex. Saline nasal spray help and can safely be used as often as needed for congestion, I have prescribed: Ipratropium Bromide nasal spray 0.03% 2 sprays in eah nostril 2-3 times a day  Some authorities believe that zinc sprays or the use of Echinacea may shorten the course of your symptoms.  Sinus infections are not as easily transmitted as other respiratory infection, however we still recommend that you avoid close contact with loved ones, especially the very young and elderly.  Remember to wash your hands thoroughly throughout the day as this is the number one way to prevent the spread of infection!  Home Care: Only take medications as instructed by your medical team. Do not take these medications with alcohol. A steam or ultrasonic humidifier can help congestion.  You can place a towel over your head and breathe in the steam from hot water coming from a faucet. Avoid close contacts especially the very young and the elderly. Cover your mouth when you cough or sneeze. Always remember to wash your hands.  Get Help Right Away If: You develop worsening fever or sinus pain. You develop a severe head ache or visual changes. Your symptoms persist after you have completed your treatment plan.  Make sure you Understand  these instructions. Will watch your condition. Will get help right away if you are not doing well or get worse.   Thank you for choosing an e-visit.  Your e-visit answers were reviewed by a board certified advanced clinical practitioner to complete your personal care plan. Depending upon the condition, your plan could have included both over the counter or prescription medications.  Please review your pharmacy choice. Make sure the pharmacy is open so you can pick up prescription now. If there is a problem, you may contact your provider through [employer] and have the prescription routed to another pharmacy.  Your safety is important to [place]. If you have drug allergies check your prescription carefully.   For the next 24 hours you can use MyChart to ask questions about today's visit, request a non-urgent call back, or ask for a work or school excuse. You will get an email in the next two days asking about your experience. I hope that your e-visit has been valuable and will speed your recovery.  I have spent 5 minutes in review of e-visit questionnaire, review and updating patient chart, medical decision making and response to patient.   Tylene Fantasia Ward, PA-C

## 2023-02-03 MED ORDER — IPRATROPIUM BROMIDE 0.03 % NA SOLN: 2.0000 | Freq: Two times a day (BID) | NASAL | 0 refills | Status: DC

## 2023-02-03 NOTE — Addendum Note (Signed)
Addended by: Bertram Denver on: 02/03/2023 09:07 AM   Modules accepted: Orders

## 2023-02-25 ENCOUNTER — Other Ambulatory Visit: Payer: Self-pay | Admitting: Nurse Practitioner

## 2023-02-26 NOTE — Telephone Encounter (Signed)
Requested medication (s) are due for refill today: yes  Requested medication (s) are on the active medication list: yes  Last refill:  02/03/23  Future visit scheduled: yes  Notes to clinic:   Medication not assigned to a protocol, review manually.      Requested Prescriptions  Pending Prescriptions Disp Refills   ipratropium (ATROVENT) 0.03 % nasal spray [Pharmacy Med Name: IPRATROPIUM 0.03% SPRAY]  1    Sig: Place 2 sprays into both nostrils every 12 (twelve) hours.     Off-Protocol Failed - 02/25/2023 12:30 PM      Failed - Medication not assigned to a protocol, review manually.      Failed - Valid encounter within last 12 months    Recent Outpatient Visits   None     Future Appointments             In 1 month Zola Button, Grayling Congress, DO [facility], [facility]           Off-Protocol Failed - 02/25/2023 12:30 PM      Failed - Medication not assigned to a protocol, review manually.      Failed - Valid encounter within last 12 months    Recent Outpatient Visits   None     Future Appointments             In 1 month Lowne Chase, Grayling Congress, DO [facility] at [facility], [facility]

## 2023-04-05 ENCOUNTER — Encounter: Payer: Medicare Other | Admitting: Family Medicine

## 2023-04-08 ENCOUNTER — Encounter: Payer: Self-pay | Admitting: Family Medicine

## 2023-04-08 IMAGING — MG MM DIGITAL SCREENING BILAT W/ TOMO AND CAD
6 of 10 series · 6 of 30 positions shown · non-contrast
Comparison: Previous exam(s).

CLINICAL DATA: Screening.

EXAM:
DIGITAL SCREENING BILATERAL MAMMOGRAM WITH TOMOSYNTHESIS AND CAD
TECHNIQUE: Bilateral screening digital craniocaudal and mediolateral oblique
mammograms were obtained. Bilateral screening digital breast
tomosynthesis was performed. The images were evaluated with
computer-aided detection.

[R CC synth-2D]
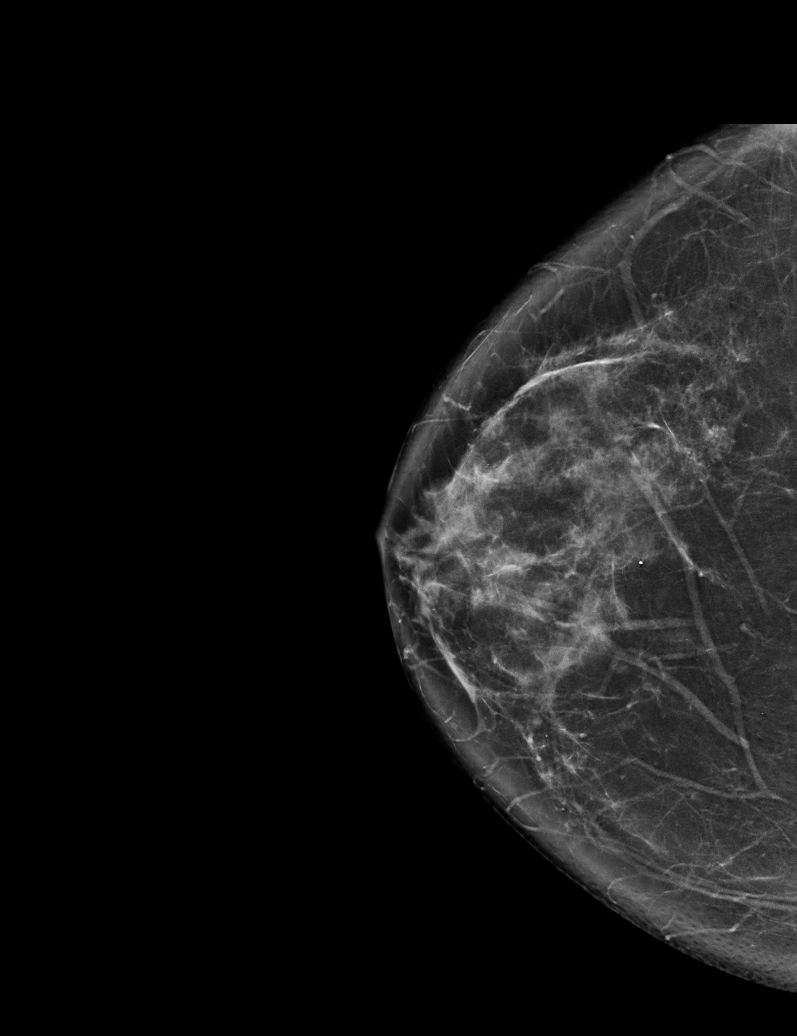

[L CC synth-2D]
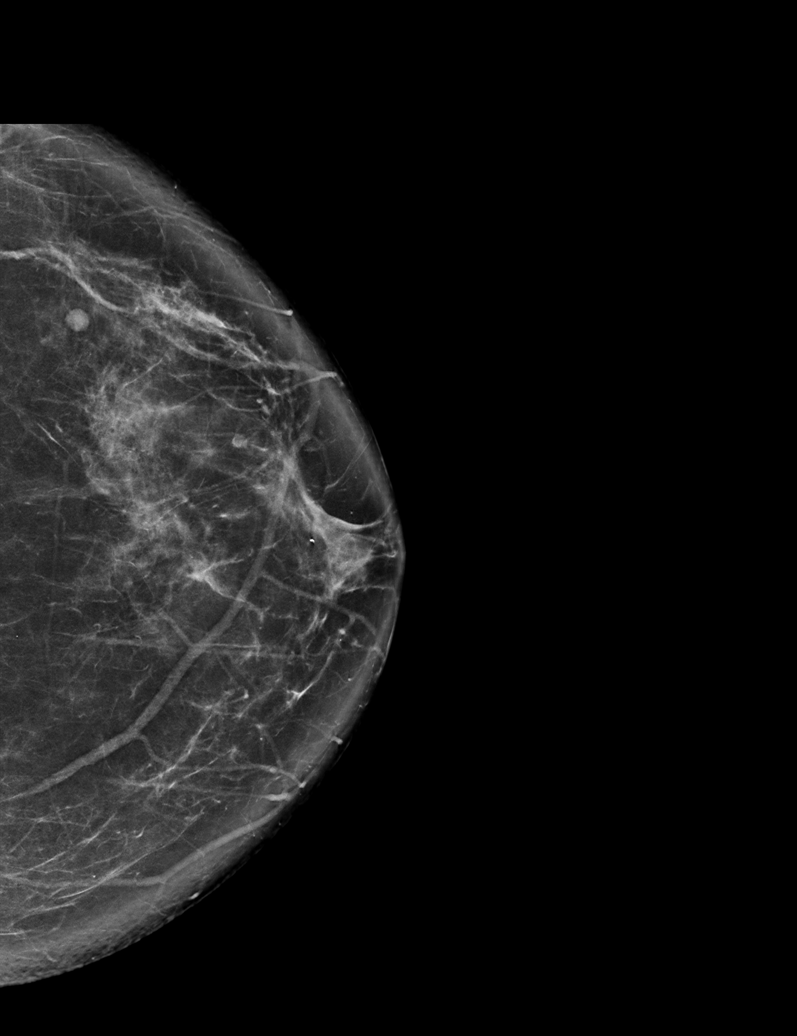

[R MLO synth-2D (1 of 2)]
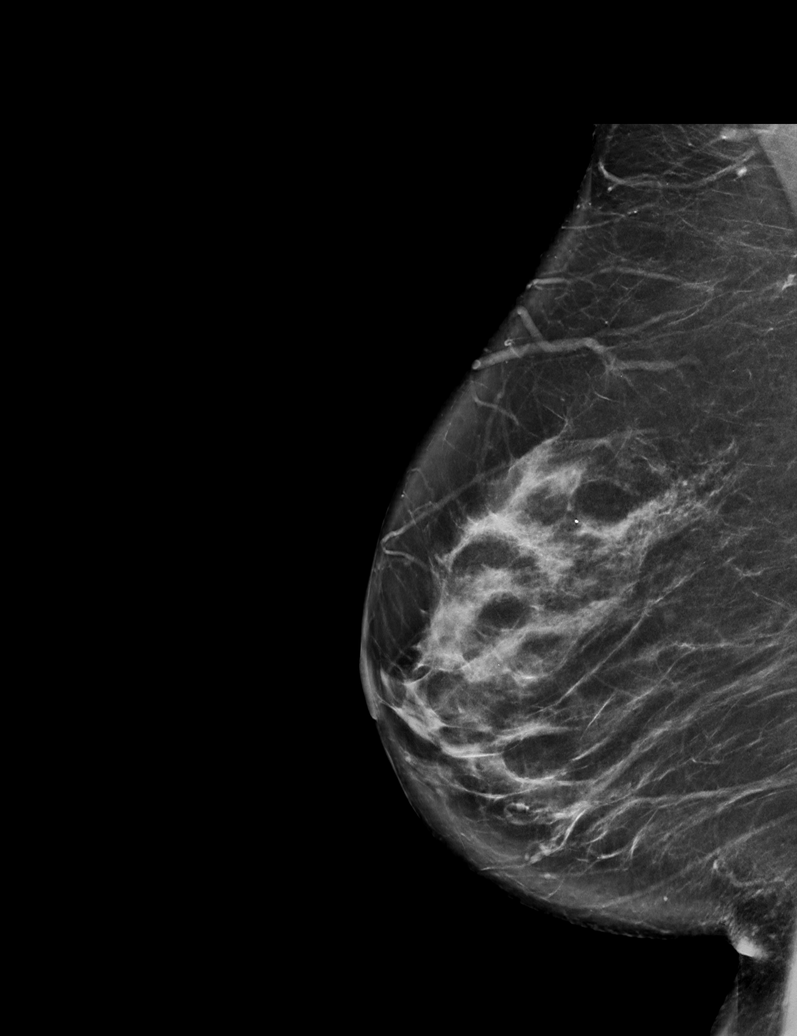

[L MLO synth-2D]
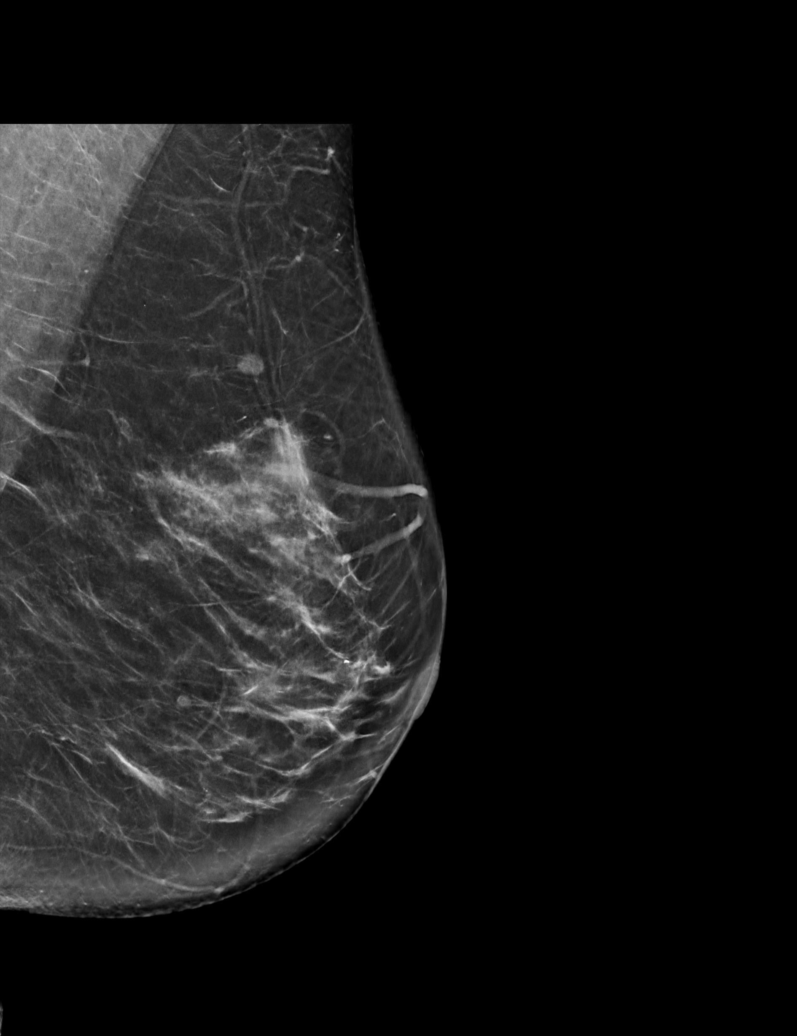

[R MLO synth-2D (2 of 2)]
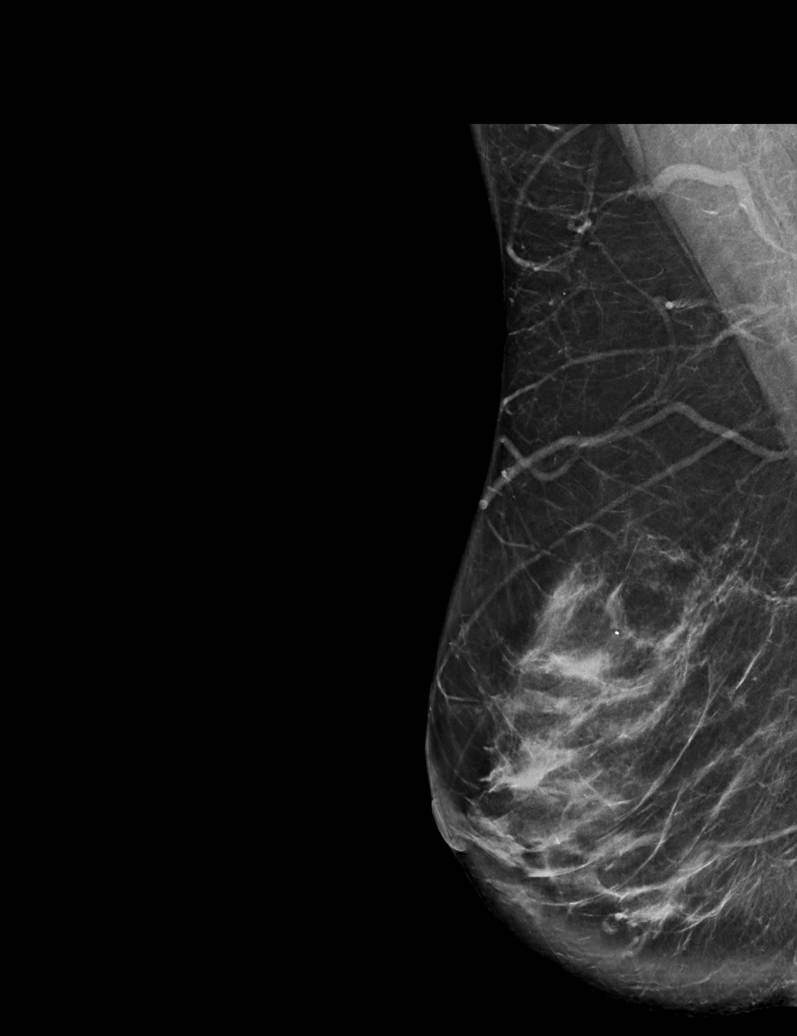

[L MLO tomo · tomo slice 43/84.0]
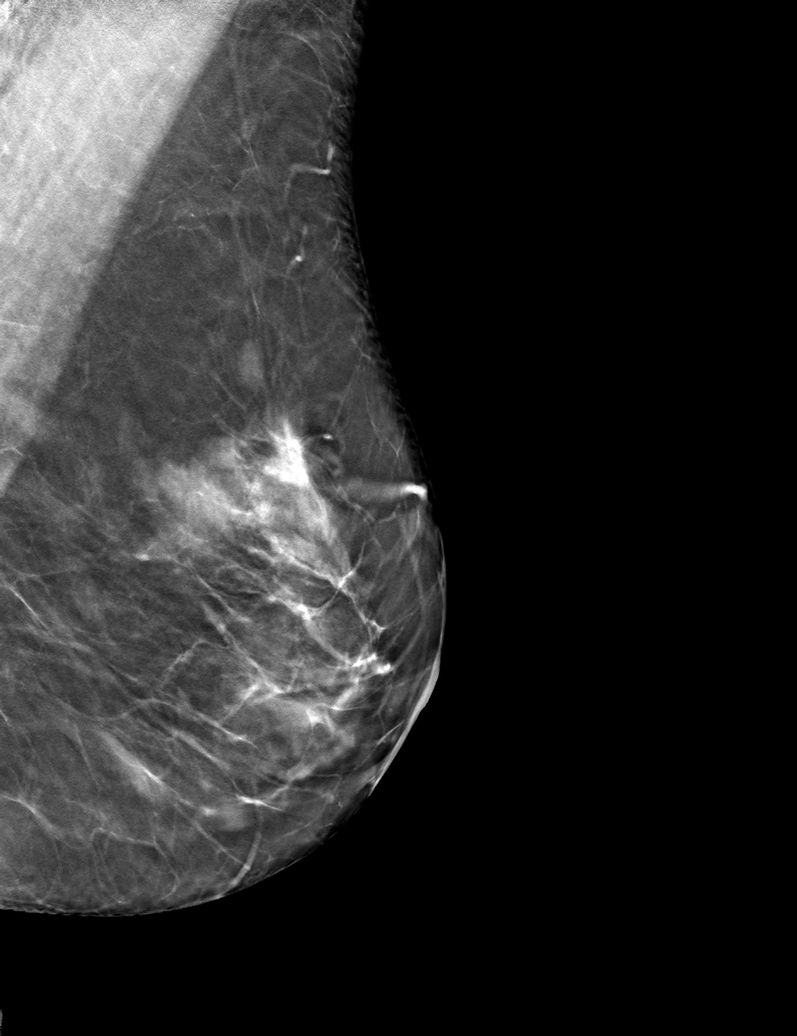

[6 of 30 positions shown; findings below may reference images not displayed]

ACR Breast Density Category c: The breast tissue is heterogeneously
dense, which may obscure small masses.
FINDINGS: There are no findings suspicious for malignancy.
IMPRESSION: No mammographic evidence of malignancy. A result letter of this
screening mammogram will be mailed directly to the patient.

RECOMMENDATION:
Screening mammogram in one year. (Code:[id number])

BI-RADS CATEGORY  1: Negative.

## 2023-04-10 ENCOUNTER — Ambulatory Visit (INDEPENDENT_AMBULATORY_CARE_PROVIDER_SITE_OTHER): Payer: Medicare Other | Admitting: Family Medicine

## 2023-04-10 ENCOUNTER — Encounter: Payer: Self-pay | Admitting: Family Medicine

## 2023-04-10 VITALS — BP 100/60 | HR 77 | Temp 97.9°F | Resp 16 | Ht 66.75 in | Wt 152.8 lb

## 2023-04-10 DIAGNOSIS — Z23 Encounter for immunization: Secondary | ICD-10-CM

## 2023-04-10 DIAGNOSIS — F988 Other specified behavioral and emotional disorders with onset usually occurring in childhood and adolescence: Secondary | ICD-10-CM | POA: Diagnosis not present

## 2023-04-10 DIAGNOSIS — E785 Hyperlipidemia, unspecified: Secondary | ICD-10-CM | POA: Diagnosis not present

## 2023-04-10 DIAGNOSIS — E559 Vitamin D deficiency, unspecified: Secondary | ICD-10-CM | POA: Diagnosis not present

## 2023-04-10 DIAGNOSIS — E2839 Other primary ovarian failure: Secondary | ICD-10-CM

## 2023-04-10 DIAGNOSIS — Z0000 Encounter for general adult medical examination without abnormal findings: Secondary | ICD-10-CM

## 2023-04-10 MED ORDER — JORNAY PM 80 MG PO CP24: ORAL_CAPSULE | ORAL | 0 refills | Status: DC

## 2023-04-10 MED ORDER — VITAMIN D (ERGOCALCIFEROL) 1.25 MG (50000 UNIT) PO CAPS: ORAL_CAPSULE | ORAL | 3 refills | Status: AC

## 2023-04-10 NOTE — Assessment & Plan Note (Signed)
Check labs Refill vita d  

## 2023-04-10 NOTE — Assessment & Plan Note (Signed)
Encourage heart healthy diet such as MIND or DASH diet, increase exercise, avoid trans fats, simple carbohydrates and processed foods, consider a krill or fish or flaxseed oil cap daily.  °

## 2023-04-10 NOTE — Assessment & Plan Note (Signed)
Per psych 

## 2023-04-10 NOTE — Assessment & Plan Note (Signed)
Ghm utd Check labs  Health Maintenance  Topic Date Due   Medicare Annual Wellness (AWV)  Never done   COVID-19 Vaccine (5 - 2023-24 season) 06/16/2022   MAMMOGRAM  05/02/2023   INFLUENZA VACCINE  05/17/2023   Colonoscopy  03/26/2031   DTaP/Tdap/Td (3 - Td or Tdap) 02/03/2032   Pneumonia Vaccine 35+ Years old  Completed   DEXA SCAN  Completed   Hepatitis C Screening  Completed   Zoster Vaccines- Shingrix  Completed   HPV VACCINES  Aged Out

## 2023-04-10 NOTE — Progress Notes (Signed)
Established Patient Office Visit  Subjective   Patient ID: Jennifer Stokes, female    DOB: 02-Jan-1956  Age: 67 y.o. MRN: [id number]  Chief Complaint  Patient presents with   Annual Exam    Pt states fasting     HPI Pt is a 67 yo female here for cpe.  No complaints  Patient Active Problem List   Diagnosis Date Noted   Attention deficit disorder 04/10/2023   Hyperlipidemia 04/10/2023   Vitamin D deficiency 04/10/2023   Pulmonary nodules 04/25/2022   Allergic conjunctivitis of both eyes 06/29/2021   Irritant contact dermatitis due to plants, except food 06/29/2021   Seasonal allergies 06/29/2021   Recurrent sinusitis 06/29/2021   Right hip pain 12/14/2020   Exposure to COVID-19 virus 12/14/2020   Preventative health care 12/14/2020   Preop examination 12/14/2020   Heel pain 07/09/2018   Contusion, arm, upper 06/04/2018   Degenerative arthritis of left knee 05/08/2017   Residual foreign body in soft tissue 05/14/2016   HEAD TRAUMA 08/19/2009   DEPRESSION 12/18/2007   Past Medical History:  Diagnosis Date   ADD (attention deficit disorder)    Arthritis    Past Surgical History:  Procedure Laterality Date   BILATERAL SALPINGECTOMY Bilateral    COSMETIC SURGERY     EXCISION METACARPAL MASS Right 11/01/2021   Procedure: EXCISION METACARPAL MASS RIGHT INDEX FINGER;  Surgeon: Betha Loa, MD;  Location: [facility];  Service: Orthopedics;  Laterality: Right;   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   TOTAL HIP ARTHROPLASTY Right 12/2020   Social History   Tobacco Use   Smoking status: Never    Passive exposure: Past   Smokeless tobacco: Never  Vaping Use   Vaping Use: Never used  Substance Use Topics   Alcohol use: No   Drug use: No   Social History   Socioeconomic History   Marital status: Married    Spouse name: Not on file   Number of children: 3   Years of education: Not on file   Highest education level: Not on file  Occupational History    Occupation: Forensic scientist   Occupation: Optician, dispensing  Tobacco Use   Smoking status: Never    Passive exposure: Past   Smokeless tobacco: Never  Vaping Use   Vaping Use: Never used  Substance and Sexual Activity   Alcohol use: No   Drug use: No   Sexual activity: Yes    Partners: Male  Other Topics Concern   Not on file  Social History Narrative   Not on file   Social Determinants of Health   Financial Resource Strain: Not on file  Food Insecurity: Not on file  Transportation Needs: Not on file  Physical Activity: Not on file  Stress: Not on file  Social Connections: Not on file  Intimate Partner Violence: Not on file   Family Status  Relation Name Status   Mother 6 Alive   Father  Deceased   Brother  Alive   Brother  Alive   Brother  Alive   [employer]  (Not Specified)   PGF  (Not Specified)   Daughter  Alive   Daughter  Alive   Son  Alive   Neg Hx  (Not Specified)   Family History  Problem Relation Age of Onset   Arrhythmia Mother    Eczema Mother    Allergic rhinitis Mother    Heart disease Mother    Allergic rhinitis Father  Heart disease Father    Heart disease Brother    Heart disease Maternal Grandfather    Stroke Paternal Grandfather    Healthy Daughter    Eczema Daughter    Healthy Daughter    Healthy Son    Diabetes Neg Hx    Hyperlipidemia Neg Hx    Hypertension Neg Hx    Allergies  Allergen Reactions   Codeine Other (See Comments)    "Wired"   Prednisone Other (See Comments)    "Wired"      Review of Systems  Constitutional:  Negative for chills, fever and malaise/fatigue.  HENT:  Negative for congestion and hearing loss.   Eyes:  Negative for discharge.  Respiratory:  Negative for cough, sputum production and shortness of breath.   Cardiovascular:  Negative for chest pain, palpitations and leg swelling.  Gastrointestinal:  Negative for abdominal pain, blood in stool, constipation, diarrhea, heartburn, nausea and vomiting.   Genitourinary:  Negative for dysuria, frequency, hematuria and urgency.  Musculoskeletal:  Negative for back pain, falls and myalgias.  Skin:  Negative for rash.  Neurological:  Negative for dizziness, sensory change, loss of consciousness, weakness and headaches.  Endo/Heme/Allergies:  Negative for environmental allergies. Does not bruise/bleed easily.  Psychiatric/Behavioral:  Negative for depression and suicidal ideas. The patient is not nervous/anxious and does not have insomnia.       Objective:     BP 100/60 (BP Location: Left Arm, Patient Position: Sitting, Cuff Size: Normal)   Pulse 77   Temp 97.9 F (36.6 C) (Oral)   Resp 16   Ht 5' 6.75" (1.695 m)   Wt 152 lb 12.8 oz (69.3 kg)   SpO2 95%   BMI 24.11 kg/m  BP Readings from Last 3 Encounters:  04/10/23 100/60  09/05/22 123/76  04/25/22 128/74   Wt Readings from Last 3 Encounters:  04/10/23 152 lb 12.8 oz (69.3 kg)  09/05/22 159 lb 3.2 oz (72.2 kg)  04/25/22 156 lb 6.4 oz (70.9 kg)   SpO2 Readings from Last 3 Encounters:  04/10/23 95%  04/25/22 99%  02/02/22 96%      Physical Exam Vitals and nursing note reviewed.  Constitutional:      General: She is not in acute distress.    Appearance: She is well-developed.  HENT:     Head: Normocephalic and atraumatic.     Right Ear: Tympanic membrane, ear canal and external ear normal.     Left Ear: Tympanic membrane, ear canal and external ear normal.     Nose: Nose normal.  Eyes:     Pupils: Pupils are equal, round, and reactive to light.  Cardiovascular:     Rate and Rhythm: Normal rate and regular rhythm.     Heart sounds: Normal heart sounds. No murmur heard. Pulmonary:     Effort: Pulmonary effort is normal. No respiratory distress.     Breath sounds: Normal breath sounds. No wheezing or rales.  Chest:     Chest wall: No tenderness.  Abdominal:     General: Abdomen is flat.     Palpations: Abdomen is soft.     Tenderness: There is no abdominal  tenderness.  Musculoskeletal:        General: No swelling. Normal range of motion.     Cervical back: Normal range of motion and neck supple.  Skin:    General: Skin is warm and dry.  Neurological:     General: No focal deficit present.     Mental  Status: She is alert and oriented to person, place, and time.  Psychiatric:        Mood and Affect: Mood normal.        Behavior: Behavior normal.        Thought Content: Thought content normal.        Judgment: Judgment normal.      No results found for any visits on 04/10/23.  Last CBC Lab Results  Component Value Date   WBC 5.0 12/14/2020   HGB 14.0 12/14/2020   HCT 41.7 12/14/2020   MCV 93.6 12/14/2020   RDW 12.8 12/14/2020   PLT 265.0 12/14/2020   Last metabolic panel Lab Results  Component Value Date   GLUCOSE 82 11/10/2021   NA 139 11/10/2021   K 4.1 11/10/2021   CL 100 11/10/2021   CO2 30 11/10/2021   BUN 18 11/10/2021   CREATININE 0.82 11/10/2021   CALCIUM 9.8 11/10/2021   PROT 7.1 11/10/2021   ALBUMIN 4.7 11/10/2021   BILITOT 0.7 11/10/2021   ALKPHOS 92 11/10/2021   AST 28 11/10/2021   ALT 26 11/10/2021   Last lipids Lab Results  Component Value Date   CHOL 269 (H) 11/10/2021   HDL 97.40 11/10/2021   LDLCALC 162 (H) 11/10/2021   LDLDIRECT 88.5 10/31/2012   TRIG 51.0 11/10/2021   CHOLHDL 3 11/10/2021   Last hemoglobin A1c Lab Results  Component Value Date   HGBA1C 5.4 10/03/2011   Last thyroid functions Lab Results  Component Value Date   TSH 1.57 12/14/2020   Last vitamin D No results found for: "25OHVITD2", "25OHVITD3", "VD25OH" Last vitamin B12 and Folate No results found for: "VITAMINB12", "FOLATE"    The 10-year ASCVD risk score (Arnett DK, et al., 2019) is: 3.6%    Assessment & Plan:   Problem List Items Addressed This Visit       Unprioritized   Vitamin D deficiency    Check labs Refill vita d      Relevant Medications   Vitamin D, Ergocalciferol, (DRISDOL) 1.25 MG  (50000 UNIT) CAPS capsule   Other Relevant Orders   VITAMIN D 25 Hydroxy (Vit-D Deficiency, Fractures)   Preventative health care - Primary    Ghm utd Check labs  Health Maintenance  Topic Date Due   Medicare Annual Wellness (AWV)  Never done   COVID-19 Vaccine (5 - 2023-24 season) 06/16/2022   MAMMOGRAM  05/02/2023   INFLUENZA VACCINE  05/17/2023   Colonoscopy  03/26/2031   DTaP/Tdap/Td (3 - Td or Tdap) 02/03/2032   Pneumonia Vaccine 51+ Years old  Completed   DEXA SCAN  Completed   Hepatitis C Screening  Completed   Zoster Vaccines- Shingrix  Completed   HPV VACCINES  Aged Out        Hyperlipidemia    Encourage heart healthy diet such as MIND or DASH diet, increase exercise, avoid trans fats, simple carbohydrates and processed foods, consider a krill or fish or flaxseed oil cap daily.        Relevant Orders   Lipid panel   Comprehensive metabolic panel   CBC with Differential/Platelet   VITAMIN D 25 Hydroxy (Vit-D Deficiency, Fractures)   Vitamin B12   Attention deficit disorder    Per psych       Relevant Medications   Methylphenidate HCl ER, PM, (JORNAY PM) 80 MG CP24   Other Visit Diagnoses     Need for pneumococcal 20-valent conjugate vaccination       Relevant Orders  Pneumococcal conjugate vaccine 20-valent (Prevnar 20) (Completed)       No follow-ups on file.    [name], DO

## 2023-04-11 LAB — COMPREHENSIVE METABOLIC PANEL
ALT: 12 U/L (ref 0–35)
AST: 17 U/L (ref 0–37)
Albumin: 4.3 g/dL (ref 3.5–5.2)
Alkaline Phosphatase: 99 U/L (ref 39–117)
BUN: 21 mg/dL (ref 6–23)
CO2: 28 mEq/L (ref 19–32)
Calcium: 9.5 mg/dL (ref 8.4–10.5)
Chloride: 102 mEq/L (ref 96–112)
Creatinine, Ser: 0.73 mg/dL (ref 0.40–1.20)
GFR: 85.91 mL/min (ref >60.00)
Glucose, Bld: 90 mg/dL (ref 70–99)
Potassium: 4.4 mEq/L (ref 3.5–5.1)
Sodium: 138 mEq/L (ref 135–145)
Total Bilirubin: 0.8 mg/dL (ref 0.2–1.2)
Total Protein: 6.6 g/dL (ref 6.0–8.3)

## 2023-04-11 LAB — CBC WITH DIFFERENTIAL/PLATELET
Basophils Absolute: 0 10*3/uL (ref 0.0–0.1)
Basophils Relative: 0.5 % (ref 0.0–3.0)
Eosinophils Absolute: 0.1 10*3/uL (ref 0.0–0.7)
Eosinophils Relative: 1.3 % (ref 0.0–5.0)
HCT: 40.8 % (ref 36.0–46.0)
Hemoglobin: 13.2 g/dL (ref 12.0–15.0)
Lymphocytes Relative: 19.7 % (ref 12.0–46.0)
Lymphs Abs: 1 10*3/uL (ref 0.7–4.0)
MCHC: 32.4 g/dL (ref 30.0–36.0)
MCV: 95.5 fl (ref 78.0–100.0)
Monocytes Absolute: 0.5 10*3/uL (ref 0.1–1.0)
Monocytes Relative: 9.6 % (ref 3.0–12.0)
Neutro Abs: 3.6 10*3/uL (ref 1.4–7.7)
Neutrophils Relative %: 68.9 % (ref 43.0–77.0)
Platelets: 252 10*3/uL (ref 150.0–400.0)
RBC: 4.27 Mil/uL (ref 3.87–5.11)
RDW: 13.3 % (ref 11.5–15.5)
WBC: 5.2 10*3/uL (ref 4.0–10.5)

## 2023-04-11 LAB — VITAMIN B12: Vitamin B-12: 161 pg/mL — ABNORMAL LOW (ref 211–911)

## 2023-04-11 LAB — LIPID PANEL
Cholesterol: 214 mg/dL — ABNORMAL HIGH (ref 0–200)
HDL: 88.4 mg/dL (ref >39.00)
LDL Cholesterol: 116 mg/dL — ABNORMAL HIGH (ref 0–99)
NonHDL: 125.24
Total CHOL/HDL Ratio: 2
Triglycerides: 45 mg/dL (ref 0.0–149.0)
VLDL: 9 mg/dL (ref 0.0–40.0)

## 2023-04-11 LAB — VITAMIN D 25 HYDROXY (VIT D DEFICIENCY, FRACTURES): VITD: 43.03 ng/mL (ref 30.00–100.00)

## 2023-04-17 ENCOUNTER — Encounter: Payer: Self-pay | Admitting: Family Medicine

## 2023-04-23 ENCOUNTER — Encounter: Payer: Self-pay | Admitting: Family Medicine

## 2023-04-23 ENCOUNTER — Ambulatory Visit: Payer: Medicare Other | Admitting: Family Medicine

## 2023-04-23 NOTE — Telephone Encounter (Signed)
Patient requesting B12 injections at [facility] location.  Okay by Kela Millin. Advised patient to call location to get scheduled on [place] schedule.  Orders for B12 under recent labs.

## 2023-04-25 ENCOUNTER — Ambulatory Visit (INDEPENDENT_AMBULATORY_CARE_PROVIDER_SITE_OTHER): Payer: Medicare Other | Admitting: *Deleted

## 2023-04-25 DIAGNOSIS — E538 Deficiency of other specified B group vitamins: Secondary | ICD-10-CM

## 2023-04-25 MED ORDER — CYANOCOBALAMIN 1000 MCG/ML IJ SOLN
1000.0000 ug | Freq: Once | INTRAMUSCULAR | Status: AC
  Administered 2023-04-25: 1000 ug via INTRAMUSCULAR

## 2023-04-25 NOTE — Progress Notes (Signed)
Pls cosign for B12 inj..Pt is a pt of [facility] saw Dr. Laury Axon on 04/09/23 and is starting B12. Forwarding to DOD to sign off.Marland KitchenRaechel Chute

## 2023-05-02 ENCOUNTER — Ambulatory Visit: Payer: Medicare Other

## 2023-05-02 DIAGNOSIS — E538 Deficiency of other specified B group vitamins: Secondary | ICD-10-CM | POA: Diagnosis not present

## 2023-05-02 MED ORDER — CYANOCOBALAMIN 1000 MCG/ML IJ SOLN
1000.0000 ug | Freq: Once | INTRAMUSCULAR | Status: AC
  Administered 2023-05-02: 1000 ug via INTRAMUSCULAR

## 2023-05-02 NOTE — Progress Notes (Signed)
Patient here for weekly B12 injection per Dr. Zola Button. B12 1000 mcg given in left IM and patient tolerated injection well today.   Routing to Dr. Yetta Barre for co-sign since provider is not located here in the office to sign off on injection.

## 2023-05-09 ENCOUNTER — Ambulatory Visit: Payer: Medicare Other

## 2023-05-09 DIAGNOSIS — E538 Deficiency of other specified B group vitamins: Secondary | ICD-10-CM | POA: Diagnosis not present

## 2023-05-09 MED ORDER — CYANOCOBALAMIN 1000 MCG/ML IJ SOLN
1000.0000 ug | Freq: Once | INTRAMUSCULAR | Status: AC
  Administered 2023-05-09: 1000 ug via INTRAMUSCULAR

## 2023-05-09 NOTE — Progress Notes (Signed)
Patient here for third weekly B12 injection per Dr. Zola Button. Routing to Dr. Yetta Barre since provider is not located in office for billing.  B12 1000 mcg given in left IM and patient tolerated injection well today.

## 2023-05-21 ENCOUNTER — Ambulatory Visit (INDEPENDENT_AMBULATORY_CARE_PROVIDER_SITE_OTHER): Payer: Medicare Other

## 2023-05-21 DIAGNOSIS — E538 Deficiency of other specified B group vitamins: Secondary | ICD-10-CM | POA: Diagnosis not present

## 2023-05-21 MED ORDER — CYANOCOBALAMIN 1000 MCG/ML IJ SOLN
1000.0000 ug | Freq: Once | INTRAMUSCULAR | Status: AC
  Administered 2023-05-21: 1000 ug via INTRAMUSCULAR

## 2023-05-21 NOTE — Progress Notes (Signed)
After obtaining consent, and per orders of Dr. Laury Axon, injection of B12 given by Ferdie Ping. Patient instructed to report any adverse reaction to me immediately.

## 2023-05-31 ENCOUNTER — Encounter (INDEPENDENT_AMBULATORY_CARE_PROVIDER_SITE_OTHER): Payer: Self-pay

## 2023-06-01 ENCOUNTER — Other Ambulatory Visit: Payer: Self-pay | Admitting: Family Medicine

## 2023-06-01 DIAGNOSIS — Z1231 Encounter for screening mammogram for malignant neoplasm of breast: Secondary | ICD-10-CM

## 2023-06-20 ENCOUNTER — Ambulatory Visit (INDEPENDENT_AMBULATORY_CARE_PROVIDER_SITE_OTHER): Payer: Medicare Other

## 2023-06-20 DIAGNOSIS — E538 Deficiency of other specified B group vitamins: Secondary | ICD-10-CM | POA: Diagnosis not present

## 2023-06-20 DIAGNOSIS — Z23 Encounter for immunization: Secondary | ICD-10-CM | POA: Diagnosis not present

## 2023-06-20 MED ORDER — CYANOCOBALAMIN 1000 MCG/ML IJ SOLN
1000.0000 ug | Freq: Once | INTRAMUSCULAR | Status: AC
  Administered 2023-06-20: 1000 ug via INTRAMUSCULAR

## 2023-06-20 NOTE — Progress Notes (Signed)
Pt here for monthly B12 injection per Dr Jonny Ruiz. Patient was referred by Dr Laury Axon.   B12 given IM and pt tolerated injection well.  Next B12 injection scheduled for 07/26/2023  Patient was advised to report to the office immediately if she noticed any adverse reactions.   I also administered her High Dose flu vaccine. She tolerated the injection very well.

## 2023-06-21 ENCOUNTER — Ambulatory Visit
Admission: RE | Admit: 2023-06-21 | Discharge: 2023-06-21 | Disposition: A | Discharge status: Home / Self Care | Payer: Medicare Other | Source: Ambulatory Visit | Attending: Family Medicine | Admitting: Family Medicine

## 2023-06-21 ENCOUNTER — Ambulatory Visit: Payer: Medicare Other

## 2023-06-21 DIAGNOSIS — Z1231 Encounter for screening mammogram for malignant neoplasm of breast: Secondary | ICD-10-CM

## 2023-06-22 ENCOUNTER — Encounter: Payer: Self-pay | Admitting: Family Medicine

## 2023-06-22 MED ORDER — ALENDRONATE SODIUM 70 MG PO TABS: 70.0000 mg | ORAL_TABLET | ORAL | 2 refills | Status: DC

## 2023-06-26 ENCOUNTER — Telehealth: Payer: Self-pay | Admitting: Family Medicine

## 2023-06-26 NOTE — Telephone Encounter (Signed)
Copied from CRM [id number]. Topic: Medicare AWV >> Jun 26, 2023 10:12 AM Payton Doughty wrote: Reason for CRM: LM 06/26/2023 to schedule AWV   Verlee Rossetti; Care Guide Ambulatory Clinical Support [place] [facility] Direct Dial: [phone]

## 2023-06-28 ENCOUNTER — Ambulatory Visit (INDEPENDENT_AMBULATORY_CARE_PROVIDER_SITE_OTHER): Payer: Medicare Other | Admitting: *Deleted

## 2023-06-28 DIAGNOSIS — Z0000 Encounter for general adult medical examination without abnormal findings: Secondary | ICD-10-CM

## 2023-06-28 NOTE — Patient Instructions (Signed)
Ms. Jennifer Stokes , Thank you for taking time to come for your Medicare Wellness Visit. I appreciate your ongoing commitment to your health goals. Please review the following plan we discussed and let me know if I can assist you in the future.   These are the goals we discussed:  Goals   None     This is a list of the screening recommended for you and due dates:  Health Maintenance  Topic Date Due   COVID-19 Vaccine (5 - 2023-24 season) 06/17/2023   Mammogram  06/20/2024   Medicare Annual Wellness Visit  06/27/2024   Colon Cancer Screening  03/26/2031   DTaP/Tdap/Td vaccine (3 - Td or Tdap) 02/03/2032   Pneumonia Vaccine  Completed   Flu Shot  Completed   DEXA scan (bone density measurement)  Completed   Hepatitis C Screening  Completed   Zoster (Shingles) Vaccine  Completed   HPV Vaccine  Aged Out     Next appointment: Follow up in one year for your annual wellness visit.   Preventive Care 35 Years and Older, Female Preventive care refers to lifestyle choices and visits with your health care provider that can promote health and wellness. What does preventive care include? A yearly physical exam. This is also called an annual well check. Dental exams once or twice a year. Routine eye exams. Ask your health care provider how often you should have your eyes checked. Personal lifestyle choices, including: Daily care of your teeth and gums. Regular physical activity. Eating a healthy diet. Avoiding tobacco and drug use. Limiting alcohol use. Practicing safe sex. Taking low-dose aspirin every day. Taking vitamin and mineral supplements as recommended by your health care provider. What happens during an annual well check? The services and screenings done by your health care provider during your annual well check will depend on your age, overall health, lifestyle risk factors, and family history of disease. Counseling  Your health care provider may ask you questions about  your: Alcohol use. Tobacco use. Drug use. Emotional well-being. Home and relationship well-being. Sexual activity. Eating habits. History of falls. Memory and ability to understand (cognition). Work and work Astronomer. Reproductive health. Screening  You may have the following tests or measurements: Height, weight, and BMI. Blood pressure. Lipid and cholesterol levels. These may be checked every 5 years, or more frequently if you are over 74 years old. Skin check. Lung cancer screening. You may have this screening every year starting at age 101 if you have a 30-pack-year history of smoking and currently smoke or have quit within the past 15 years. Fecal occult blood test (FOBT) of the stool. You may have this test every year starting at age 22. Flexible sigmoidoscopy or colonoscopy. You may have a sigmoidoscopy every 5 years or a colonoscopy every 10 years starting at age 65. Hepatitis C blood test. Hepatitis B blood test. Sexually transmitted disease (STD) testing. Diabetes screening. This is done by checking your blood sugar (glucose) after you have not eaten for a while (fasting). You may have this done every 1-3 years. Bone density scan. This is done to screen for osteoporosis. You may have this done starting at age 60. Mammogram. This may be done every 1-2 years. Talk to your health care provider about how often you should have regular mammograms. Talk with your health care provider about your test results, treatment options, and if necessary, the need for more tests. Vaccines  Your health care provider may recommend certain vaccines, such as: Influenza  vaccine. This is recommended every year. Tetanus, diphtheria, and acellular pertussis (Tdap, Td) vaccine. You may need a Td booster every 10 years. Zoster vaccine. You may need this after age 22. Pneumococcal 13-valent conjugate (PCV13) vaccine. One dose is recommended after age 62. Pneumococcal polysaccharide (PPSV23) vaccine.  One dose is recommended after age 12. Talk to your health care provider about which screenings and vaccines you need and how often you need them. This information is not intended to replace advice given to you by your health care provider. Make sure you discuss any questions you have with your health care provider. Document Released: 10/29/2015 Document Revised: 06/21/2016 Document Reviewed: 08/03/2015 Elsevier Interactive Patient Education  2017 [employer].  Fall Prevention in the Home Falls can cause injuries. They can happen to people of all ages. There are many things you can do to make your home safe and to help prevent falls. What can I do on the outside of my home? Regularly fix the edges of walkways and driveways and fix any cracks. Remove anything that might make you trip as you walk through a door, such as a raised step or threshold. Trim any bushes or trees on the path to your home. Use bright outdoor lighting. Clear any walking paths of anything that might make someone trip, such as rocks or tools. Regularly check to see if handrails are loose or broken. Make sure that both sides of any steps have handrails. Any raised decks and porches should have guardrails on the edges. Have any leaves, snow, or ice cleared regularly. Use sand or salt on walking paths during winter. Clean up any spills in your garage right away. This includes oil or grease spills. What can I do in the bathroom? Use night lights. Install grab bars by the toilet and in the tub and shower. Do not use towel bars as grab bars. Use non-skid mats or decals in the tub or shower. If you need to sit down in the shower, use a plastic, non-slip stool. Keep the floor dry. Clean up any water that spills on the floor as soon as it happens. Remove soap buildup in the tub or shower regularly. Attach bath mats securely with double-sided non-slip rug tape. Do not have throw rugs and other things on the floor that can make  you trip. What can I do in the bedroom? Use night lights. Make sure that you have a light by your bed that is easy to reach. Do not use any sheets or blankets that are too big for your bed. They should not hang down onto the floor. Have a firm chair that has side arms. You can use this for support while you get dressed. Do not have throw rugs and other things on the floor that can make you trip. What can I do in the kitchen? Clean up any spills right away. Avoid walking on wet floors. Keep items that you use a lot in easy-to-reach places. If you need to reach something above you, use a strong step stool that has a grab bar. Keep electrical cords out of the way. Do not use floor polish or wax that makes floors slippery. If you must use wax, use non-skid floor wax. Do not have throw rugs and other things on the floor that can make you trip. What can I do with my stairs? Do not leave any items on the stairs. Make sure that there are handrails on both sides of the stairs and use them. Fix  handrails that are broken or loose. Make sure that handrails are as long as the stairways. Check any carpeting to make sure that it is firmly attached to the stairs. Fix any carpet that is loose or worn. Avoid having throw rugs at the top or bottom of the stairs. If you do have throw rugs, attach them to the floor with carpet tape. Make sure that you have a light switch at the top of the stairs and the bottom of the stairs. If you do not have them, ask someone to add them for you. What else can I do to help prevent falls? Wear shoes that: Do not have high heels. Have rubber bottoms. Are comfortable and fit you well. Are closed at the toe. Do not wear sandals. If you use a stepladder: Make sure that it is fully opened. Do not climb a closed stepladder. Make sure that both sides of the stepladder are locked into place. Ask someone to hold it for you, if possible. Clearly mark and make sure that you can  see: Any grab bars or handrails. First and last steps. Where the edge of each step is. Use tools that help you move around (mobility aids) if they are needed. These include: Canes. Walkers. Scooters. Crutches. Turn on the lights when you go into a dark area. Replace any light bulbs as soon as they burn out. Set up your furniture so you have a clear path. Avoid moving your furniture around. If any of your floors are uneven, fix them. If there are any pets around you, be aware of where they are. Review your medicines with your doctor. Some medicines can make you feel dizzy. This can increase your chance of falling. Ask your doctor what other things that you can do to help prevent falls. This information is not intended to replace advice given to you by your health care provider. Make sure you discuss any questions you have with your health care provider. Document Released: 07/29/2009 Document Revised: 03/09/2016 Document Reviewed: 11/06/2014 Elsevier Interactive Patient Education  2017 [employer].

## 2023-06-28 NOTE — Progress Notes (Signed)
Subjective:   Jennifer Stokes is a 67 y.o. female who presents for an Initial Medicare Annual Wellness Visit.  Visit Complete: Virtual  I connected with  [name] on 06/28/23 by a audio enabled telemedicine application and verified that I am speaking with the correct person using two identifiers.  Patient Location: Home  Provider Location: Office/Clinic  I discussed the limitations of evaluation and management by telemedicine. The patient expressed understanding and agreed to proceed.  Patient Medicare AWV questionnaire was completed by the patient on 06/27/23; I have confirmed that all information answered by patient is correct and no changes since this date.  Review of Systems     Cardiac Risk Factors include: advanced age (>7men, >47 women);dyslipidemia     Objective:    Vital Signs: Unable to obtain new vitals due to this being a telehealth visit.      06/28/2023    2:20 PM 11/01/2021   11:54 AM 10/20/2021   12:06 PM  Advanced Directives  Does Patient Have a Medical Advance Directive? Yes Yes Yes  Type of Estate agent of [place];Living will Healthcare Power of [place];Living will Healthcare Power of Attorney  Does patient want to make changes to medical advance directive? No - Patient declined    Copy of Healthcare Power of Attorney in Chart? No - copy requested No - copy requested No - copy requested    Current Medications (verified) Outpatient Encounter Medications as of 06/28/2023  Medication Sig   alendronate (FOSAMAX) 70 MG tablet Take 1 tablet (70 mg total) by mouth every 7 (seven) days. Take with a full glass of water on an empty stomach.   cycloSPORINE (RESTASIS) 0.05 % ophthalmic emulsion 1 drop 2 (two) times daily.   JORNAY PM 40 MG CP24    sertraline (ZOLOFT) 50 MG tablet Take 1 tablet by mouth daily.   Vitamin D, Ergocalciferol, (DRISDOL) 1.25 MG (50000 UNIT) CAPS capsule TAKE 1 CAPSULE BY MOUTH ONE TIME PER WEEK   [DISCONTINUED]  Methylphenidate HCl ER, PM, (JORNAY PM) 80 MG CP24 1 po qpm   No facility-administered encounter medications on file as of 06/28/2023.    Allergies (verified) Codeine and Prednisone   History: Past Medical History:  Diagnosis Date   ADD (attention deficit disorder)    Allergy 2020   Arthritis    Past Surgical History:  Procedure Laterality Date   BILATERAL SALPINGECTOMY Bilateral    COSMETIC SURGERY     EXCISION METACARPAL MASS Right 11/01/2021   Procedure: EXCISION METACARPAL MASS RIGHT INDEX FINGER;  Surgeon: Betha Loa, MD;  Location: [facility];  Service: Orthopedics;  Laterality: Right;   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   TOTAL HIP ARTHROPLASTY Right 12/2020   TUBAL LIGATION  05/30/1998   Family History  Problem Relation Age of Onset   Arrhythmia Mother    Eczema Mother    Allergic rhinitis Mother    Heart disease Mother    Arthritis Mother    Hearing loss Mother    Vision loss Mother    Varicose Veins Mother    Allergic rhinitis Father    Heart disease Father    Arthritis Father    Hearing loss Father    Vision loss Father    Heart disease Brother    Heart disease Maternal Grandfather    Stroke Paternal Grandfather    Healthy Daughter    Eczema Daughter    Healthy Daughter    Healthy Son  Heart disease Maternal Grandmother    Heart disease Paternal Grandmother    ADD / ADHD Daughter    Anxiety disorder Brother    Depression Brother    Drug abuse Brother    Heart disease Brother    Obesity Brother    Diabetes Neg Hx    Hyperlipidemia Neg Hx    Hypertension Neg Hx    Social History   Socioeconomic History   Marital status: Married    Spouse name: Not on file   Number of children: 3   Years of education: Not on file   Highest education level: Master's degree (e.g., MA, MS, MEng, MEd, MSW, MBA)  Occupational History   Occupation: Forensic scientist   Occupation: Optician, dispensing  Tobacco Use   Smoking status: Never     Passive exposure: Past   Smokeless tobacco: Never  Vaping Use   Vaping status: Never Used  Substance and Sexual Activity   Alcohol use: No   Drug use: No   Sexual activity: Yes    Partners: Male    Birth control/protection: Post-menopausal  Other Topics Concern   Not on file  Social History Narrative   Not on file   Social Determinants of Health   Financial Resource Strain: Low Risk  (06/27/2023)   Overall Financial Resource Strain (CARDIA)    Difficulty of Paying Living Expenses: Not hard at all  Food Insecurity: No Food Insecurity (06/27/2023)   Hunger Vital Sign    Worried About Running Out of Food in the Last Year: Never true    Ran Out of Food in the Last Year: Never true  Transportation Needs: No Transportation Needs (06/27/2023)   PRAPARE - Administrator, Civil Service (Medical): No    Lack of Transportation (Non-Medical): No  Physical Activity: Sufficiently Active (06/27/2023)   Exercise Vital Sign    Days of Exercise per Week: 5 days    Minutes of Exercise per Session: 60 min  Stress: No Stress Concern Present (06/27/2023)   [employer] of Occupational Health - Occupational Stress Questionnaire    Feeling of Stress : Only a little  Social Connections: Socially Integrated (06/27/2023)   Social Connection and Isolation Panel [NHANES]    Frequency of Communication with Friends and Family: More than three times a week    Frequency of Social Gatherings with Friends and Family: More than three times a week    Attends Religious Services: More than 4 times per year    Active Member of [employer] or Organizations: Yes    Attends Engineer, structural: More than 4 times per year    Marital Status: Married    Tobacco Counseling Counseling given: Not Answered   Clinical Intake:  Pre-visit preparation completed: Yes  Pain : No/denies pain  Nutritional Risks: None Diabetes: No  How often do you need to have someone help you when you read  instructions, pamphlets, or other written materials from your doctor or pharmacy?: 1 - Never  Interpreter Needed?: No  Information entered by :: [employer], CMA   Activities of Daily Living    06/27/2023    9:30 PM  In your present state of health, do you have any difficulty performing the following activities:  Hearing? 0  Vision? 0  Difficulty concentrating or making decisions? 0  Walking or climbing stairs? 0  Dressing or bathing? 0  Doing errands, shopping? 0  Preparing Food and eating ? N  Using the Toilet? N  In the  past six months, have you accidently leaked urine? Y  Do you have problems with loss of bowel control? N  Managing your Medications? N  Managing your Finances? N  Housekeeping or managing your Housekeeping? N    Patient Care Team: Zola Button, Grayling Congress, DO as PCP - General Milagros Evener, MD as Consulting Physician (Psychiatry) Romero Belling, MD as Referring Physician (Physical Medicine and Rehabilitation) Sheral Apley, MD as Attending Physician (Orthopedic Surgery) Candice Camp, MD as Consulting Physician (Obstetrics and Gynecology)  Indicate any recent Medical Services you may have received from other than [facility] providers in the past year (date may be approximate).     Assessment:   This is a routine wellness examination for Deonna.  Hearing/Vision screen No results found.   Goals Addressed   None    Depression Screen    06/28/2023    2:20 PM 04/10/2023    1:58 PM 02/02/2022    2:11 PM 11/10/2021    3:22 PM 04/25/2018    8:42 AM 04/25/2018    8:41 AM 04/10/2016    1:49 PM  PHQ 2/9 Scores  PHQ - 2 Score 0 0 0 0 0 0 0  PHQ- 9 Score     1      Fall Risk    06/27/2023    9:30 PM 04/10/2023    1:58 PM 02/02/2022    2:11 PM 06/28/2021    2:02 PM  Fall Risk   Falls in the past year? 0 0 0 0  Number falls in past yr: 0 0 0 0  Injury with Fall? 0 0 0 0  Risk for fall due to : No Fall Risks     Follow up Falls evaluation completed Falls  evaluation completed Falls evaluation completed Falls evaluation completed    MEDICARE RISK AT HOME: Medicare Risk at Home Any stairs in or around the home?: Yes If so, are there any without handrails?: No Home free of loose throw rugs in walkways, pet beds, electrical cords, etc?: No Adequate lighting in your home to reduce risk of falls?: Yes Life alert?: No Use of a cane, walker or w/c?: No Grab bars in the bathroom?: No Shower chair or bench in shower?: No Elevated toilet seat or a handicapped toilet?: No  TIMED UP AND GO:  Was the test performed? No    Cognitive Function:        06/28/2023    2:23 PM  6CIT Screen  What Year? 0 points  What month? 0 points  What time? 0 points  Count back from 20 0 points  Months in reverse 0 points  Repeat phrase 0 points  Total Score 0 points    Immunizations Immunization History  Administered Date(s) Administered   Fluad Trivalent(High Dose 65+) 06/20/2023   Influenza Split 07/16/2013   Influenza Whole 06/16/2012   Influenza,inj,Quad PF,6+ Mos 07/10/2015, 06/20/2017, 07/09/2018   Influenza-Unspecified 05/17/2020, 06/23/2022   MMR 05/08/2018   Moderna Sars-Covid-2 Vaccination 10/28/2019, 11/26/2019, 08/31/2020, 05/14/2021   PNEUMOCOCCAL CONJUGATE-20 04/10/2023   Tdap 10/03/2011, 02/02/2022   Zoster Recombinant(Shingrix) 04/14/2017, 08/04/2017   Zoster, Live 10/03/2011    TDAP status: Up to date  Flu Vaccine status: Up to date  Pneumococcal vaccine status: Up to date  Covid-19 vaccine status: Information provided on how to obtain vaccines.   Qualifies for Shingles Vaccine? Yes   Zostavax completed Yes   Shingrix Completed?: Yes  Screening Tests Health Maintenance  Topic Date Due   Medicare Annual Wellness (  AWV)  Never done   COVID-19 Vaccine (5 - 2023-24 season) 06/17/2023   MAMMOGRAM  06/20/2024   Colonoscopy  03/26/2031   DTaP/Tdap/Td (3 - Td or Tdap) 02/03/2032   Pneumonia Vaccine 64+ Years old  Completed    INFLUENZA VACCINE  Completed   DEXA SCAN  Completed   Hepatitis C Screening  Completed   Zoster Vaccines- Shingrix  Completed   HPV VACCINES  Aged Out    Health Maintenance  Health Maintenance Due  Topic Date Due   Medicare Annual Wellness (AWV)  Never done   COVID-19 Vaccine (5 - 2023-24 season) 06/17/2023    Colorectal cancer screening: Type of screening: Colonoscopy. Completed 03/25/21. Repeat every 10 years  Mammogram status: Completed 06/21/23. Repeat every year  Bone Density status: Completed 06/21/23. Results reflect: Bone density results: OSTEOPENIA. Repeat every 2 years.  Lung Cancer Screening: (Low Dose CT Chest recommended if Age 59-80 years, 20 pack-year currently smoking OR have quit w/in 15years.) does not qualify.   Additional Screening:  Hepatitis C Screening: does qualify; Completed 04/11/16  Vision Screening: Recommended annual ophthalmology exams for early detection of glaucoma and other disorders of the eye. Is the patient up to date with their annual eye exam?  Yes  Who is the provider or what is the name of the office in which the patient attends annual eye exams? [facility] If pt is not established with a provider, would they like to be referred to a provider to establish care? No .   Dental Screening: Recommended annual dental exams for proper oral hygiene  Diabetic Foot Exam: N/a  Community Resource Referral / Chronic Care Management: CRR required this visit?  No   CCM required this visit?  No     Plan:     I have personally reviewed and noted the following in the patient's chart:   Medical and social history Use of alcohol, tobacco or illicit drugs  Current medications and supplements including opioid prescriptions. Patient is not currently taking opioid prescriptions. Functional ability and status Nutritional status Physical activity Advanced directives List of other physicians Hospitalizations, surgeries, and ER visits in  previous 12 months Vitals Screenings to include cognitive, depression, and falls Referrals and appointments  In addition, I have reviewed and discussed with patient certain preventive protocols, quality metrics, and best practice recommendations. A written personalized care plan for preventive services as well as general preventive health recommendations were provided to patient.     Donne Anon, CMA   06/28/2023   After Visit Summary: (MyChart) Due to this being a telephonic visit, the after visit summary with patients personalized plan was offered to patient via MyChart   Nurse Notes: None

## 2023-07-26 ENCOUNTER — Ambulatory Visit: Payer: Medicare Other | Admitting: Radiology

## 2023-07-26 DIAGNOSIS — E538 Deficiency of other specified B group vitamins: Secondary | ICD-10-CM

## 2023-07-26 MED ORDER — CYANOCOBALAMIN 1000 MCG/ML IJ SOLN
1000.0000 ug | Freq: Once | INTRAMUSCULAR | Status: AC
  Administered 2023-07-26: 1000 ug via INTRAMUSCULAR

## 2023-07-26 NOTE — Progress Notes (Signed)
Patient here for monthly b12 injection. Patient tolerated well with no complications.

## 2023-08-27 ENCOUNTER — Ambulatory Visit (INDEPENDENT_AMBULATORY_CARE_PROVIDER_SITE_OTHER): Payer: Medicare Other

## 2023-08-27 DIAGNOSIS — E538 Deficiency of other specified B group vitamins: Secondary | ICD-10-CM | POA: Diagnosis not present

## 2023-08-27 MED ORDER — CYANOCOBALAMIN 1000 MCG/ML IJ SOLN
1000.0000 ug | Freq: Once | INTRAMUSCULAR | Status: AC
  Administered 2023-08-27: 1000 ug via INTRAMUSCULAR

## 2023-08-27 NOTE — Progress Notes (Cosign Needed Addendum)
After obtaining consent, and per orders of Dr. Laury Axon, injection of B12 given by Ferdie Ping. Patient instructed to report any adverse reaction to me immediately.

## 2023-09-27 ENCOUNTER — Ambulatory Visit: Payer: Medicare Other

## 2023-09-27 DIAGNOSIS — E538 Deficiency of other specified B group vitamins: Secondary | ICD-10-CM

## 2023-09-27 MED ORDER — CYANOCOBALAMIN 1000 MCG/ML IJ SOLN
1000.0000 ug | Freq: Once | INTRAMUSCULAR | Status: AC
  Administered 2023-09-27: 1000 ug via INTRAMUSCULAR

## 2023-09-27 NOTE — Progress Notes (Signed)
PT visits today for their b-12 injection. PT informed of what they had received and tolerated injection well. PT notified to reach out to office if needed.

## 2023-11-01 ENCOUNTER — Ambulatory Visit: Payer: Medicare Other

## 2023-11-01 DIAGNOSIS — E538 Deficiency of other specified B group vitamins: Secondary | ICD-10-CM

## 2023-11-01 MED ORDER — CYANOCOBALAMIN 1000 MCG/ML IJ SOLN
1000.0000 ug | Freq: Once | INTRAMUSCULAR | Status: AC
  Administered 2023-11-01: 1000 ug via INTRAMUSCULAR

## 2023-11-01 NOTE — Progress Notes (Signed)
Patient visits today for their b-12 injection. Patient informed of what they had received and tolerated injection well. Patient notified to reach out to office if needed.

## 2023-11-29 ENCOUNTER — Ambulatory Visit: Payer: Medicare Other

## 2023-11-29 DIAGNOSIS — E538 Deficiency of other specified B group vitamins: Secondary | ICD-10-CM | POA: Diagnosis not present

## 2023-11-29 MED ORDER — CYANOCOBALAMIN 1000 MCG/ML IJ SOLN
1000.0000 ug | Freq: Once | INTRAMUSCULAR | Status: AC
  Administered 2023-11-29: 1000 ug via INTRAMUSCULAR

## 2023-11-29 NOTE — Progress Notes (Signed)
Patient presents in office today for monthly b12 injection. Tolerated injection well.

## 2023-12-06 ENCOUNTER — Other Ambulatory Visit: Payer: Medicare Other

## 2023-12-20 ENCOUNTER — Encounter: Payer: Self-pay | Admitting: Emergency Medicine

## 2024-01-01 ENCOUNTER — Ambulatory Visit (INDEPENDENT_AMBULATORY_CARE_PROVIDER_SITE_OTHER): Payer: Medicare Other

## 2024-01-01 DIAGNOSIS — E538 Deficiency of other specified B group vitamins: Secondary | ICD-10-CM | POA: Diagnosis not present

## 2024-01-01 MED ORDER — CYANOCOBALAMIN 1000 MCG/ML IJ SOLN
1000.0000 ug | Freq: Once | INTRAMUSCULAR | Status: AC
  Administered 2024-01-01: 1000 ug via INTRAMUSCULAR

## 2024-01-01 NOTE — Progress Notes (Signed)
 Patient visits today for their b-12 injection. Patient informed of what they had received and tolerated injection well. Patient notified to reach out to office if needed.

## 2024-01-31 ENCOUNTER — Ambulatory Visit

## 2024-02-05 NOTE — Progress Notes (Deleted)
 Jennifer Stokes is a 68 y.o. female presents to the office today for Monthly B12 injection, per physician's orders. Original order: 04/10/2023: "Vita b12 low---- would benefit from b12 injections weekly x4 then monthly." Cyanocobalamin  1000 mcg/ml IM was administered *** Deltoid today. Patient tolerated injection. Patient due for follow up labs/provider appt: Yes. Date due: PENDING 05/05/24 Patient next injection due: 1 month for monthly B12 injection, appt made {yes/no:[id number]}  Creft, Francena Infield L

## 2024-02-06 ENCOUNTER — Ambulatory Visit (INDEPENDENT_AMBULATORY_CARE_PROVIDER_SITE_OTHER)

## 2024-02-06 ENCOUNTER — Ambulatory Visit

## 2024-02-06 DIAGNOSIS — E538 Deficiency of other specified B group vitamins: Secondary | ICD-10-CM | POA: Diagnosis not present

## 2024-02-06 MED ORDER — CYANOCOBALAMIN 1000 MCG/ML IJ SOLN
1000.0000 ug | Freq: Once | INTRAMUSCULAR | Status: AC
  Administered 2024-02-06: 1000 ug via INTRAMUSCULAR

## 2024-02-06 NOTE — Progress Notes (Signed)
 Patient is in office today for a nurse visit for B12 Injection, per PCP's order. Patient Injection was given in the  Left deltoid. Patient tolerated injection well.

## 2024-02-26 ENCOUNTER — Other Ambulatory Visit: Payer: Self-pay | Admitting: Family Medicine

## 2024-03-21 ENCOUNTER — Ambulatory Visit

## 2024-03-21 ENCOUNTER — Ambulatory Visit (INDEPENDENT_AMBULATORY_CARE_PROVIDER_SITE_OTHER)

## 2024-03-21 DIAGNOSIS — E538 Deficiency of other specified B group vitamins: Secondary | ICD-10-CM | POA: Diagnosis not present

## 2024-03-21 MED ORDER — CYANOCOBALAMIN 1000 MCG/ML IJ SOLN
1000.0000 ug | Freq: Once | INTRAMUSCULAR | Status: AC
  Administered 2024-03-21: 1000 ug via INTRAMUSCULAR

## 2024-03-21 NOTE — Progress Notes (Signed)
 Pt received B12 injection today.  Pt tolerated injection well.

## 2024-04-23 ENCOUNTER — Ambulatory Visit (INDEPENDENT_AMBULATORY_CARE_PROVIDER_SITE_OTHER)

## 2024-04-23 DIAGNOSIS — E538 Deficiency of other specified B group vitamins: Secondary | ICD-10-CM | POA: Diagnosis not present

## 2024-04-23 MED ORDER — CYANOCOBALAMIN 1000 MCG/ML IJ SOLN
1000.0000 ug | Freq: Once | INTRAMUSCULAR | Status: AC
  Administered 2024-04-23: 1000 ug via INTRAMUSCULAR

## 2024-04-23 NOTE — Progress Notes (Addendum)
 After obtaining consent, and per orders of Dr. Antonio, injection of B12 given by Ronnald SHAUNNA Palms. Patient instructed to  report any adverse reaction to me immediately.   Medical screening examination/treatment/procedure(s) were performed by non-physician practitioner and as supervising physician I was immediately available for consultation/collaboration.  I agree with above. Karlynn Noel, MD

## 2024-04-28 ENCOUNTER — Telehealth: Payer: Self-pay | Admitting: Family Medicine

## 2024-04-28 NOTE — Telephone Encounter (Signed)
 Pt wants to get lab work done before physical please advise if orders can be placed. Pt asked to be called with answer.

## 2024-05-02 ENCOUNTER — Encounter: Payer: Self-pay | Admitting: Family Medicine

## 2024-05-05 ENCOUNTER — Encounter: Payer: Medicare Other | Admitting: Family Medicine

## 2024-05-06 ENCOUNTER — Other Ambulatory Visit: Payer: Self-pay | Admitting: Family Medicine

## 2024-05-06 DIAGNOSIS — E785 Hyperlipidemia, unspecified: Secondary | ICD-10-CM

## 2024-05-06 DIAGNOSIS — E559 Vitamin D deficiency, unspecified: Secondary | ICD-10-CM

## 2024-05-06 NOTE — Addendum Note (Signed)
 Addended by: ELOUISE POWELL HERO on: 05/06/2024 02:32 PM   Modules accepted: Orders

## 2024-05-06 NOTE — Telephone Encounter (Signed)
Pt needs lab appointment please 

## 2024-05-08 NOTE — Telephone Encounter (Signed)
 LVM for pt call us  to get scheduled for labs

## 2024-05-13 ENCOUNTER — Other Ambulatory Visit (INDEPENDENT_AMBULATORY_CARE_PROVIDER_SITE_OTHER)

## 2024-05-13 DIAGNOSIS — E785 Hyperlipidemia, unspecified: Secondary | ICD-10-CM | POA: Diagnosis not present

## 2024-05-13 DIAGNOSIS — E559 Vitamin D deficiency, unspecified: Secondary | ICD-10-CM

## 2024-05-13 LAB — COMPREHENSIVE METABOLIC PANEL WITH GFR
ALT: 16 U/L (ref 0–35)
AST: 19 U/L (ref 0–37)
Albumin: 4.6 g/dL (ref 3.5–5.2)
Alkaline Phosphatase: 49 U/L (ref 39–117)
BUN: 22 mg/dL (ref 6–23)
CO2: 27 meq/L (ref 19–32)
Calcium: 9.6 mg/dL (ref 8.4–10.5)
Chloride: 103 meq/L (ref 96–112)
Creatinine, Ser: 0.86 mg/dL (ref 0.40–1.20)
GFR: 70.03 mL/min (ref >60.00)
Glucose, Bld: 86 mg/dL (ref 70–99)
Potassium: 4.2 meq/L (ref 3.5–5.1)
Sodium: 141 meq/L (ref 135–145)
Total Bilirubin: 0.7 mg/dL (ref 0.2–1.2)
Total Protein: 7.1 g/dL (ref 6.0–8.3)

## 2024-05-13 LAB — CBC WITH DIFFERENTIAL/PLATELET
Basophils Absolute: 0 K/uL (ref 0.0–0.1)
Basophils Relative: 0.4 % (ref 0.0–3.0)
Eosinophils Absolute: 0.1 K/uL (ref 0.0–0.7)
Eosinophils Relative: 1.6 % (ref 0.0–5.0)
HCT: 42.6 % (ref 36.0–46.0)
Hemoglobin: 14.2 g/dL (ref 12.0–15.0)
Lymphocytes Relative: 26.1 % (ref 12.0–46.0)
Lymphs Abs: 1.1 K/uL (ref 0.7–4.0)
MCHC: 33.3 g/dL (ref 30.0–36.0)
MCV: 94.1 fl (ref 78.0–100.0)
Monocytes Absolute: 0.4 K/uL (ref 0.1–1.0)
Monocytes Relative: 10.6 % (ref 3.0–12.0)
Neutro Abs: 2.6 K/uL (ref 1.4–7.7)
Neutrophils Relative %: 61.3 % (ref 43.0–77.0)
Platelets: 243 K/uL (ref 150.0–400.0)
RBC: 4.53 Mil/uL (ref 3.87–5.11)
RDW: 12.7 % (ref 11.5–15.5)
WBC: 4.2 K/uL (ref 4.0–10.5)

## 2024-05-13 LAB — LIPID PANEL
Cholesterol: 210 mg/dL — ABNORMAL HIGH (ref 0–200)
HDL: 83.2 mg/dL (ref >39.00)
LDL Cholesterol: 118 mg/dL — ABNORMAL HIGH (ref 0–99)
NonHDL: 126.32
Total CHOL/HDL Ratio: 3
Triglycerides: 44 mg/dL (ref 0.0–149.0)
VLDL: 8.8 mg/dL (ref 0.0–40.0)

## 2024-05-13 LAB — VITAMIN D 25 HYDROXY (VIT D DEFICIENCY, FRACTURES): VITD: 63.85 ng/mL (ref 30.00–100.00)

## 2024-05-14 ENCOUNTER — Ambulatory Visit: Payer: Self-pay | Admitting: Family

## 2024-05-20 ENCOUNTER — Encounter: Payer: Self-pay | Admitting: Family Medicine

## 2024-05-20 ENCOUNTER — Ambulatory Visit (INDEPENDENT_AMBULATORY_CARE_PROVIDER_SITE_OTHER): Admitting: Family Medicine

## 2024-05-20 VITALS — BP 108/70 | HR 73 | Temp 98.0°F | Resp 18 | Ht 65.75 in | Wt 148.4 lb

## 2024-05-20 DIAGNOSIS — E785 Hyperlipidemia, unspecified: Secondary | ICD-10-CM | POA: Diagnosis not present

## 2024-05-20 DIAGNOSIS — E559 Vitamin D deficiency, unspecified: Secondary | ICD-10-CM | POA: Diagnosis not present

## 2024-05-20 DIAGNOSIS — Z0000 Encounter for general adult medical examination without abnormal findings: Secondary | ICD-10-CM | POA: Diagnosis not present

## 2024-05-20 NOTE — Progress Notes (Unsigned)
 Subjective:    Patient ID: Jennifer Stokes, female    DOB: 05-20-1956, 68 y.o.   MRN: [id number]  Chief Complaint  Patient presents with   Annual Exam    HPI Patient is in today for cpe.  Discussed the use of AI scribe software for clinical note transcription with the patient, who gave verbal consent to proceed.  History of Present Illness Jennifer Stokes is a 68 year old female who presents for a routine follow-up and review of recent blood work.  She notes an increase in monocyte relative count in her recent blood work, which she does not understand. Her hemoglobin and other parameters are within range except for cholesterol. Her cholesterol levels have improved over the past two years, with LDL decreasing from 162 to 118. She continues to receive monthly B12 shots and wants to continue them.  She has been following the [employer], which she finds challenging to maintain long-term. She experienced initial success with weight loss but has recently gone off the diet. Her current weight is 148 pounds, and she aims to lose an additional 10.5 pounds. Her BMI is 24, and she wants to maintain her current weight range.  Her mother is turning 100 and is currently on hospice care, receiving morphine and hydrocodone. She expresses concern about her mother's health and the impact of aging on her own health.  She is considering options for osteoporosis treatment, currently taking Fosamax  once a week. She has difficulty remembering to take it and is exploring other treatment options, including injections, despite the cost.  She reports difficulty sleeping, which is intermittent. She has tried melatonin without success.    Past Medical History:  Diagnosis Date   ADD (attention deficit disorder)    Allergy 2020   Arthritis     Past Surgical History:  Procedure Laterality Date   BILATERAL SALPINGECTOMY Bilateral    COSMETIC SURGERY     EXCISION METACARPAL MASS Right 11/01/2021   Procedure:  EXCISION METACARPAL MASS RIGHT INDEX FINGER;  Surgeon: Murrell Drivers, MD;  Location: [facility];  Service: Orthopedics;  Laterality: Right;   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   TOTAL HIP ARTHROPLASTY Right 12/2020   TUBAL LIGATION  05/30/1998    Family History  Problem Relation Age of Onset   Arrhythmia Mother    Eczema Mother    Allergic rhinitis Mother    Heart disease Mother    Arthritis Mother    Hearing loss Mother    Vision loss Mother    Varicose Veins Mother    Allergic rhinitis Father    Heart disease Father    Arthritis Father    Hearing loss Father    Vision loss Father    Heart disease Brother    Heart disease Maternal Grandfather    Stroke Paternal Grandfather    Healthy Daughter    Eczema Daughter    Healthy Daughter    Healthy Son    Heart disease Maternal Grandmother    Heart disease Paternal Grandmother    ADD / ADHD Daughter    Anxiety disorder Brother    Depression Brother    Drug abuse Brother    Heart disease Brother    Obesity Brother    Diabetes Neg Hx    Hyperlipidemia Neg Hx    Hypertension Neg Hx     Social History   Socioeconomic History   Marital status: Married    Spouse name: Not on file   Number  of children: 3   Years of education: Not on file   Highest education level: Master's degree (e.g., MA, MS, MEng, MEd, MSW, MBA)  Occupational History   Occupation: Forensic scientist   Occupation: Optician, dispensing  Tobacco Use   Smoking status: Never    Passive exposure: Past   Smokeless tobacco: Never  Vaping Use   Vaping status: Never Used  Substance and Sexual Activity   Alcohol use: No   Drug use: No   Sexual activity: Yes    Partners: Male    Birth control/protection: Post-menopausal  Other Topics Concern   Not on file  Social History Narrative   Not on file   Social Drivers of Health   Financial Resource Strain: Low Risk  (05/14/2024)   Overall Financial Resource Strain (CARDIA)    Difficulty of  Paying Living Expenses: Not hard at all  Food Insecurity: No Food Insecurity (05/14/2024)   Hunger Vital Sign    Worried About Running Out of Food in the Last Year: Never true    Ran Out of Food in the Last Year: Never true  Transportation Needs: No Transportation Needs (05/14/2024)   PRAPARE - Administrator, Civil Service (Medical): No    Lack of Transportation (Non-Medical): No  Physical Activity: Sufficiently Active (05/14/2024)   Exercise Vital Sign    Days of Exercise per Week: 3 days    Minutes of Exercise per Session: 60 min  Stress: No Stress Concern Present (05/14/2024)   [employer] of Occupational Health - Occupational Stress Questionnaire    Feeling of Stress: Only a little  Social Connections: Socially Integrated (05/14/2024)   Social Connection and Isolation Panel    Frequency of Communication with Friends and Family: More than three times a week    Frequency of Social Gatherings with Friends and Family: More than three times a week    Attends Religious Services: More than 4 times per year    Active Member of [employer] or Organizations: Yes    Attends Engineer, structural: More than 4 times per year    Marital Status: Married  Catering manager Violence: Not At Risk (06/28/2023)   Humiliation, Afraid, Rape, and Kick questionnaire    Fear of Current or Ex-Partner: No    Emotionally Abused: No    Physically Abused: No    Sexually Abused: No    Outpatient Medications Prior to Visit  Medication Sig Dispense Refill   alendronate  (FOSAMAX ) 70 MG tablet TAKE 1 TABLET (70 MG TOTAL) BY MOUTH EVERY 7 DAYS WITH FULL GLASS WATER ON EMPTY STOMACH 12 tablet 2   JORNAY PM  40 MG CP24      sertraline  (ZOLOFT ) 50 MG tablet Take 1 tablet by mouth daily.     Vitamin D , Ergocalciferol , (DRISDOL ) 1.25 MG (50000 UNIT) CAPS capsule TAKE 1 CAPSULE BY MOUTH ONE TIME PER WEEK 12 capsule 3   cycloSPORINE (RESTASIS) 0.05 % ophthalmic emulsion 1 drop 2 (two) times daily.      No facility-administered medications prior to visit.    Allergies  Allergen Reactions   Codeine Other (See Comments)    Wired   Prednisone  Other (See Comments)    Wired    Review of Systems  Constitutional:  Negative for chills, fever and malaise/fatigue.  HENT:  Negative for congestion and hearing loss.   Eyes:  Negative for blurred vision and discharge.  Respiratory:  Negative for cough, sputum production and shortness of breath.   Cardiovascular:  Negative  for chest pain, palpitations and leg swelling.  Gastrointestinal:  Negative for abdominal pain, blood in stool, constipation, diarrhea, heartburn, nausea and vomiting.  Genitourinary:  Negative for dysuria, frequency, hematuria and urgency.  Musculoskeletal:  Negative for back pain, falls and myalgias.  Skin:  Negative for rash.  Neurological:  Negative for dizziness, sensory change, loss of consciousness, weakness and headaches.  Endo/Heme/Allergies:  Negative for environmental allergies. Does not bruise/bleed easily.  Psychiatric/Behavioral:  Negative for depression and suicidal ideas. The patient has insomnia. The patient is not nervous/anxious.        Objective:    Physical Exam Vitals and nursing note reviewed.  Constitutional:      General: She is not in acute distress.    Appearance: Normal appearance. She is well-developed.  HENT:     Head: Normocephalic and atraumatic.     Right Ear: Tympanic membrane, ear canal and external ear normal. There is no impacted cerumen.     Left Ear: Tympanic membrane, ear canal and external ear normal. There is no impacted cerumen.     Nose: Nose normal.     Mouth/Throat:     Mouth: Mucous membranes are moist.     Pharynx: Oropharynx is clear. No oropharyngeal exudate or posterior oropharyngeal erythema.  Eyes:     General: No scleral icterus.       Right eye: No discharge.        Left eye: No discharge.     Conjunctiva/sclera: Conjunctivae normal.     Pupils: Pupils  are equal, round, and reactive to light.  Neck:     Thyroid : No thyromegaly or thyroid  tenderness.     Vascular: No JVD.  Cardiovascular:     Rate and Rhythm: Normal rate and regular rhythm.     Heart sounds: Normal heart sounds. No murmur heard. Pulmonary:     Effort: Pulmonary effort is normal. No respiratory distress.     Breath sounds: Normal breath sounds.  Abdominal:     General: Bowel sounds are normal. There is no distension.     Palpations: Abdomen is soft. There is no mass.     Tenderness: There is no abdominal tenderness. There is no guarding or rebound.  Musculoskeletal:        General: Normal range of motion.     Cervical back: Normal range of motion and neck supple.     Right lower leg: No edema.     Left lower leg: No edema.  Lymphadenopathy:     Cervical: No cervical adenopathy.  Skin:    General: Skin is warm and dry.     Findings: No erythema or rash.  Neurological:     Mental Status: She is alert and oriented to person, place, and time.     Cranial Nerves: No cranial nerve deficit.     Deep Tendon Reflexes: Reflexes are normal and symmetric.  Psychiatric:        Mood and Affect: Mood normal.        Behavior: Behavior normal.        Thought Content: Thought content normal.        Judgment: Judgment normal.     BP 108/70 (BP Location: Left Arm, Patient Position: Sitting, Cuff Size: Normal)   Pulse 73   Temp 98 F (36.7 C) (Oral)   Resp 18   Ht 5' 5.75 (1.67 m)   Wt 148 lb 6.4 oz (67.3 kg)   SpO2 97%   BMI 24.13 kg/m  Wt Readings  from Last 3 Encounters:  05/20/24 148 lb 6.4 oz (67.3 kg)  04/10/23 152 lb 12.8 oz (69.3 kg)  09/05/22 159 lb 3.2 oz (72.2 kg)    Diabetic Foot Exam - Simple   No data filed    Lab Results  Component Value Date   WBC 4.2 05/13/2024   HGB 14.2 05/13/2024   HCT 42.6 05/13/2024   PLT 243.0 05/13/2024   GLUCOSE 86 05/13/2024   CHOL 210 (H) 05/13/2024   TRIG 44.0 05/13/2024   HDL 83.20 05/13/2024   LDLDIRECT 88.5  10/31/2012   LDLCALC 118 (H) 05/13/2024   ALT 16 05/13/2024   AST 19 05/13/2024   NA 141 05/13/2024   K 4.2 05/13/2024   CL 103 05/13/2024   CREATININE 0.86 05/13/2024   BUN 22 05/13/2024   CO2 27 05/13/2024   TSH 1.57 12/14/2020   HGBA1C 5.4 10/03/2011    Lab Results  Component Value Date   TSH 1.57 12/14/2020   Lab Results  Component Value Date   WBC 4.2 05/13/2024   HGB 14.2 05/13/2024   HCT 42.6 05/13/2024   MCV 94.1 05/13/2024   PLT 243.0 05/13/2024   Lab Results  Component Value Date   NA 141 05/13/2024   K 4.2 05/13/2024   CO2 27 05/13/2024   GLUCOSE 86 05/13/2024   BUN 22 05/13/2024   CREATININE 0.86 05/13/2024   BILITOT 0.7 05/13/2024   ALKPHOS 49 05/13/2024   AST 19 05/13/2024   ALT 16 05/13/2024   PROT 7.1 05/13/2024   ALBUMIN 4.6 05/13/2024   CALCIUM 9.6 05/13/2024   GFR 70.03 05/13/2024   Lab Results  Component Value Date   CHOL 210 (H) 05/13/2024   Lab Results  Component Value Date   HDL 83.20 05/13/2024   Lab Results  Component Value Date   LDLCALC 118 (H) 05/13/2024   Lab Results  Component Value Date   TRIG 44.0 05/13/2024   Lab Results  Component Value Date   CHOLHDL 3 05/13/2024   Lab Results  Component Value Date   HGBA1C 5.4 10/03/2011       Assessment & Plan:  Preventative health care Assessment & Plan: Ghm utd Check labs  See AVS Health Maintenance  Topic Date Due   COVID-19 Vaccine (5 - 2024-25 season) 06/17/2023   INFLUENZA VACCINE  05/16/2024   Medicare Annual Wellness (AWV)  06/27/2024   MAMMOGRAM  06/20/2024   Colonoscopy  03/26/2031   DTaP/Tdap/Td (3 - Td or Tdap) 02/03/2032   Pneumococcal Vaccine: 50+ Years  Completed   DEXA SCAN  Completed   Hepatitis C Screening  Completed   Zoster Vaccines- Shingrix  Completed   Hepatitis B Vaccines  Aged Out   HPV VACCINES  Aged Out   Meningococcal B Vaccine  Aged Out        Hyperlipidemia, unspecified hyperlipidemia type Assessment & Plan: Encourage  heart healthy diet such as MIND or DASH diet, increase exercise, avoid trans fats, simple carbohydrates and processed foods, consider a krill or fish or flaxseed oil cap daily.     Vitamin D  deficiency  Assessment and Plan Assessment & Plan Hyperlipidemia   Chronic hyperlipidemia with improved LDL levels, now at 118 from 162 two years ago. HDL levels have slightly decreased but remain cardioprotective.   Osteopenia   Osteopenia is treated with weekly Fosamax . Discussed injectable treatments, but insurance requires an osteoporosis diagnosis for coverage. Continue Fosamax  weekly and investigate the cost of Prolia  and other injectable treatments.  Carey Johndrow R Lowne Chase, DO

## 2024-05-21 ENCOUNTER — Telehealth: Payer: Self-pay | Admitting: *Deleted

## 2024-05-21 DIAGNOSIS — M8580 Other specified disorders of bone density and structure, unspecified site: Secondary | ICD-10-CM

## 2024-05-21 NOTE — Telephone Encounter (Signed)
-----   Message from Jennifer Stokes Cyndee sent at 05/21/2024 12:06 PM EDT ----- Pt is interested in knowing how much evenity / prolia  cost out of pocket

## 2024-05-21 NOTE — Assessment & Plan Note (Signed)
 Ghm utd Check labs  See AVS Health Maintenance  Topic Date Due   COVID-19 Vaccine (5 - 2024-25 season) 06/17/2023   INFLUENZA VACCINE  05/16/2024   Medicare Annual Wellness (AWV)  06/27/2024   MAMMOGRAM  06/20/2024   Colonoscopy  03/26/2031   DTaP/Tdap/Td (3 - Td or Tdap) 02/03/2032   Pneumococcal Vaccine: 50+ Years  Completed   DEXA SCAN  Completed   Hepatitis C Screening  Completed   Zoster Vaccines- Shingrix  Completed   Hepatitis B Vaccines  Aged Out   HPV VACCINES  Aged Out   Meningococcal B Vaccine  Aged Out

## 2024-05-21 NOTE — Telephone Encounter (Signed)
 Her last couple of bone densities you put osteopenia and in chart you but osteoporosis.  Just would like to clarify dx.  Neither is in her problem list.

## 2024-05-21 NOTE — Assessment & Plan Note (Signed)
 Encourage heart healthy diet such as MIND or DASH diet, increase exercise, avoid trans fats, simple carbohydrates and processed foods, consider a krill or fish or flaxseed oil cap daily.

## 2024-05-21 NOTE — Patient Instructions (Signed)
 Preventive Care 43 Years and Older, Female Preventive care refers to lifestyle choices and visits with your health care provider that can promote health and wellness. Preventive care visits are also called wellness exams. What can I expect for my preventive care visit? Counseling Your health care provider may ask you questions about your: Medical history, including: Past medical problems. Family medical history. Pregnancy and menstrual history. History of falls. Current health, including: Memory and ability to understand (cognition). Emotional well-being. Home life and relationship well-being. Sexual activity and sexual health. Lifestyle, including: Alcohol, nicotine or tobacco, and drug use. Access to firearms. Diet, exercise, and sleep habits. Work and work Astronomer. Sunscreen use. Safety issues such as seatbelt and bike helmet use. Physical exam Your health care provider will check your: Height and weight. These may be used to calculate your BMI (body mass index). BMI is a measurement that tells if you are at a healthy weight. Waist circumference. This measures the distance around your waistline. This measurement also tells if you are at a healthy weight and may help predict your risk of certain diseases, such as type 2 diabetes and high blood pressure. Heart rate and blood pressure. Body temperature. Skin for abnormal spots. What immunizations do I need?  Vaccines are usually given at various ages, according to a schedule. Your health care provider will recommend vaccines for you based on your age, medical history, and lifestyle or other factors, such as travel or where you work. What tests do I need? Screening Your health care provider may recommend screening tests for certain conditions. This may include: Lipid and cholesterol levels. Hepatitis C test. Hepatitis B test. HIV (human immunodeficiency virus) test. STI (sexually transmitted infection) testing, if you are at  risk. Lung cancer screening. Colorectal cancer screening. Diabetes screening. This is done by checking your blood sugar (glucose) after you have not eaten for a while (fasting). Mammogram. Talk with your health care provider about how often you should have regular mammograms. BRCA-related cancer screening. This may be done if you have a family history of breast, ovarian, tubal, or peritoneal cancers. Bone density scan. This is done to screen for osteoporosis. Talk with your health care provider about your test results, treatment options, and if necessary, the need for more tests. Follow these instructions at home: Eating and drinking  Eat a diet that includes fresh fruits and vegetables, whole grains, lean protein, and low-fat dairy products. Limit your intake of foods with high amounts of sugar, saturated fats, and salt. Take vitamin and mineral supplements as recommended by your health care provider. Do not drink alcohol if your health care provider tells you not to drink. If you drink alcohol: Limit how much you have to 0-1 drink a day. Know how much alcohol is in your drink. In the [place] one drink equals one 12 oz bottle of beer (355 mL), one 5 oz glass of wine (148 mL), or one 1 oz glass of hard liquor (44 mL). Lifestyle Brush your teeth every morning and night with fluoride toothpaste. Floss one time each day. Exercise for at least 30 minutes 5 or more days each week. Do not use any products that contain nicotine or tobacco. These products include cigarettes, chewing tobacco, and vaping devices, such as e-cigarettes. If you need help quitting, ask your health care provider. Do not use drugs. If you are sexually active, practice safe sex. Use a condom or other form of protection in order to prevent STIs. Take aspirin only as told by  your health care provider. Make sure that you understand how much to take and what form to take. Work with your health care provider to find out whether it  is safe and beneficial for you to take aspirin daily. Ask your health care provider if you need to take a cholesterol-lowering medicine (statin). Find healthy ways to manage stress, such as: Meditation, yoga, or listening to music. Journaling. Talking to a trusted person. Spending time with friends and family. Minimize exposure to UV radiation to reduce your risk of skin cancer. Safety Always wear your seat belt while driving or riding in a vehicle. Do not drive: If you have been drinking alcohol. Do not ride with someone who has been drinking. When you are tired or distracted. While texting. If you have been using any mind-altering substances or drugs. Wear a helmet and other protective equipment during sports activities. If you have firearms in your house, make sure you follow all gun safety procedures. What's next? Visit your health care provider once a year for an annual wellness visit. Ask your health care provider how often you should have your eyes and teeth checked. Stay up to date on all vaccines. This information is not intended to replace advice given to you by your health care provider. Make sure you discuss any questions you have with your health care provider. Document Revised: 03/30/2021 Document Reviewed: 03/30/2021 Elsevier Patient Education  2024 [employer].

## 2024-05-22 ENCOUNTER — Telehealth: Payer: Self-pay

## 2024-05-22 MED ORDER — DENOSUMAB 60 MG/ML ~~LOC~~ SOSY
60.0000 mg | PREFILLED_SYRINGE | Freq: Once | SUBCUTANEOUS | Status: AC
Start: 2024-06-04 — End: 2024-06-10
  Administered 2024-06-10: 60 mg via SUBCUTANEOUS

## 2024-05-22 NOTE — Telephone Encounter (Signed)
 Prolia  initiated first.

## 2024-05-22 NOTE — Telephone Encounter (Signed)
 Prolia VOB initiated via [email or web address]  Next Prolia inj DUE: NEW START

## 2024-05-27 ENCOUNTER — Ambulatory Visit

## 2024-05-27 ENCOUNTER — Other Ambulatory Visit (HOSPITAL_COMMUNITY): Payer: Self-pay

## 2024-05-27 ENCOUNTER — Telehealth: Payer: Self-pay

## 2024-05-27 DIAGNOSIS — M8580 Other specified disorders of bone density and structure, unspecified site: Secondary | ICD-10-CM

## 2024-05-27 MED ORDER — ROMOSOZUMAB-AQQG 105 MG/1.17ML ~~LOC~~ SOSY: 210.0000 mg | PREFILLED_SYRINGE | Freq: Once | SUBCUTANEOUS | Status: AC

## 2024-05-27 NOTE — Addendum Note (Signed)
 Addended by: ESTELLE GILLIS D on: 05/27/2024 02:13 PM   Modules accepted: Orders

## 2024-05-27 NOTE — Telephone Encounter (Signed)
 PHARMACY BENEFIT: $308.18

## 2024-05-27 NOTE — Telephone Encounter (Signed)
 Evenity VOB initiated via [email or web address]  Last Evenity inj:  Next Evenity inj DUE:  [employer]

## 2024-05-27 NOTE — Telephone Encounter (Signed)
 Medicare does not cover for diagnosis of Osteopenia

## 2024-05-27 NOTE — Telephone Encounter (Signed)
 Evenity  initiated.

## 2024-05-28 ENCOUNTER — Ambulatory Visit

## 2024-05-28 DIAGNOSIS — E538 Deficiency of other specified B group vitamins: Secondary | ICD-10-CM | POA: Diagnosis not present

## 2024-05-28 MED ORDER — CYANOCOBALAMIN 1000 MCG/ML IJ SOLN
1000.0000 ug | Freq: Once | INTRAMUSCULAR | Status: AC
  Administered 2024-05-28 (×2): 1000 ug via INTRAMUSCULAR

## 2024-05-29 ENCOUNTER — Other Ambulatory Visit (HOSPITAL_COMMUNITY): Payer: Self-pay

## 2024-05-29 NOTE — Telephone Encounter (Signed)
 Medicare does not cover for diagnosis of Osteopenia

## 2024-06-02 ENCOUNTER — Other Ambulatory Visit (HOSPITAL_COMMUNITY): Payer: Self-pay

## 2024-06-03 NOTE — Telephone Encounter (Signed)
 Copied from CRM #[id number]. Topic: General - Other >> Jun 03, 2024  4:14 PM Chiquita SQUIBB wrote: Reason for CRM: Patient is calling Sheket[facility] back, patient stated that she missed a phone call from her yesterday and was told to ask for [facility]. Upon calling [facility], I was told to inform the patient they do not have an update for the Prolia , and nothing was noted of [facility] calling the patient. Patient is requesting a call back from Dana.

## 2024-06-04 ENCOUNTER — Encounter (HOSPITAL_COMMUNITY): Payer: Self-pay

## 2024-06-04 ENCOUNTER — Other Ambulatory Visit (HOSPITAL_COMMUNITY): Payer: Self-pay

## 2024-06-04 ENCOUNTER — Other Ambulatory Visit: Payer: Self-pay

## 2024-06-04 MED ORDER — DENOSUMAB 60 MG/ML ~~LOC~~ SOSY
60.0000 mg | PREFILLED_SYRINGE | SUBCUTANEOUS | 0 refills | Status: AC
Start: 2024-06-04 — End: ?
  Filled 2024-06-04 – 2024-06-05 (×2): qty 1, 180d supply, fill #0

## 2024-06-04 NOTE — Addendum Note (Signed)
 Addended by: ESTELLE GILLIS D on: 06/04/2024 03:18 PM   Modules accepted: Orders

## 2024-06-04 NOTE — Telephone Encounter (Signed)
 Pt will get Prolia  and will get from pharmacy.

## 2024-06-05 ENCOUNTER — Other Ambulatory Visit: Payer: Self-pay

## 2024-06-05 NOTE — Progress Notes (Signed)
 Specialty Pharmacy Initial Fill Coordination Note  Jennifer Stokes is a 68 y.o. female contacted today regarding initial fill of specialty medication(s) Denosumab  (PROLIA )   Patient requested Courier to Provider Office   Delivery date: 06/09/24   Verified address: [facility], [place] [address]   Medication will be filled on 8/22.   Patient is aware of $308.18 copayment.

## 2024-06-05 NOTE — Progress Notes (Addendum)
 Pharmacy Patient Advocate Encounter  Insurance verification completed.   The patient is insured through [facility]   Ran test claim for Prolia . Co-pay is $308.18.  This test claim was processed through [facility]- copay amounts may vary at other pharmacies due to pharmacy/plan contracts, or as the patient moves through the different stages of their insurance plan.

## 2024-06-06 ENCOUNTER — Other Ambulatory Visit: Payer: Self-pay

## 2024-06-10 ENCOUNTER — Other Ambulatory Visit: Payer: Self-pay | Admitting: Family Medicine

## 2024-06-10 ENCOUNTER — Ambulatory Visit (INDEPENDENT_AMBULATORY_CARE_PROVIDER_SITE_OTHER)

## 2024-06-10 DIAGNOSIS — M8580 Other specified disorders of bone density and structure, unspecified site: Secondary | ICD-10-CM

## 2024-06-10 DIAGNOSIS — Z1231 Encounter for screening mammogram for malignant neoplasm of breast: Secondary | ICD-10-CM

## 2024-06-10 MED ORDER — DENOSUMAB 60 MG/ML ~~LOC~~ SOSY: 60.0000 mg | PREFILLED_SYRINGE | SUBCUTANEOUS | Status: AC

## 2024-06-10 NOTE — Progress Notes (Signed)
 Pt was in office today for initial dose of Prolia  injection today, injection was given subcutaneous in L arm, pt tolerated well.

## 2024-06-19 ENCOUNTER — Encounter: Payer: Self-pay | Admitting: Family Medicine

## 2024-07-03 ENCOUNTER — Ambulatory Visit

## 2024-07-03 NOTE — Progress Notes (Signed)
 Patient presented to clinic for b-12 injection on 05/28/2024. Injection was given and patient handled injection well. No abnormalities happened during encounter.

## 2024-07-09 ENCOUNTER — Inpatient Hospital Stay
Admission: RE | Admit: 2024-07-09 | Discharge: 2024-07-09 | Discharge status: Home / Self Care | Source: Ambulatory Visit | Attending: Family Medicine | Admitting: Family Medicine

## 2024-07-09 DIAGNOSIS — Z1231 Encounter for screening mammogram for malignant neoplasm of breast: Secondary | ICD-10-CM

## 2024-07-11 ENCOUNTER — Ambulatory Visit (INDEPENDENT_AMBULATORY_CARE_PROVIDER_SITE_OTHER): Admitting: Radiology

## 2024-07-11 DIAGNOSIS — E538 Deficiency of other specified B group vitamins: Secondary | ICD-10-CM

## 2024-07-11 MED ORDER — CYANOCOBALAMIN 1000 MCG/ML IJ SOLN
1000.0000 ug | Freq: Once | INTRAMUSCULAR | Status: AC
  Administered 2024-07-11: 1000 ug via INTRAMUSCULAR

## 2024-07-11 NOTE — Progress Notes (Signed)
Patient here for monthly B12 injection. Patient tolerated well with no complications.

## 2024-07-15 ENCOUNTER — Ambulatory Visit

## 2024-07-15 VITALS — Ht 66.0 in | Wt 148.0 lb

## 2024-07-15 DIAGNOSIS — Z0000 Encounter for general adult medical examination without abnormal findings: Secondary | ICD-10-CM | POA: Diagnosis not present

## 2024-07-15 NOTE — Progress Notes (Signed)
 Subjective:   Jennifer Stokes is a 68 y.o. who presents for a Medicare Wellness preventive visit.  As a reminder, Annual Wellness Visits don't include a physical exam, and some assessments may be limited, especially if this visit is performed virtually. We may recommend an in-person follow-up visit with your provider if needed.  Visit Complete: Virtual I connected with  Jennifer Stokes on 07/15/24 by a audio enabled telemedicine application and verified that I am speaking with the correct person using two identifiers.  Patient Location: Home  Provider Location: Home Office  I discussed the limitations of evaluation and management by telemedicine. The patient expressed understanding and agreed to proceed.  Vital Signs: Because this visit was a virtual/telehealth visit, some criteria may be missing or patient reported. Any vitals not documented were not able to be obtained and vitals that have been documented are patient reported.    Persons Participating in Visit: Patient.  AWV Questionnaire: Yes: Patient Medicare AWV questionnaire was completed by the patient on 07/10/24; I have confirmed that all information answered by patient is correct and no changes since this date.  Cardiac Risk Factors include: advanced age (>37men, >39 women)     Objective:    Today's Vitals   07/15/24 1601  Weight: 148 lb (67.1 kg)  Height: 5' 6 (1.676 m)   Body mass index is 23.89 kg/m.     07/15/2024    4:08 PM 06/28/2023    2:20 PM 11/01/2021   11:54 AM 10/20/2021   12:06 PM  Advanced Directives  Does Patient Have a Medical Advance Directive? Yes Yes Yes Yes  Type of Estate agent of [place];Living will Healthcare Power of [place];Living will Healthcare Power of [place];Living will Healthcare Power of Attorney  Does patient want to make changes to medical advance directive?  No - Patient declined    Copy of Healthcare Power of Attorney in Chart? No - copy requested No - copy  requested No - copy requested No - copy requested    Current Medications (verified) Outpatient Encounter Medications as of 07/15/2024  Medication Sig   alendronate  (FOSAMAX ) 70 MG tablet TAKE 1 TABLET (70 MG TOTAL) BY MOUTH EVERY 7 DAYS WITH FULL GLASS WATER ON EMPTY STOMACH   denosumab  (PROLIA ) 60 MG/ML SOSY injection Inject 60 mg into the skin every 6 (six) months. Dx code: M85.80.  Pt has appt  06/10/24.   JORNAY PM  40 MG CP24    sertraline  (ZOLOFT ) 50 MG tablet Take 1 tablet by mouth daily.   Vitamin D , Ergocalciferol , (DRISDOL ) 1.25 MG (50000 UNIT) CAPS capsule TAKE 1 CAPSULE BY MOUTH ONE TIME PER WEEK   Facility-Administered Encounter Medications as of 07/15/2024  Medication   [START ON 12/07/2024] denosumab  (PROLIA ) injection 60 mg   Romosozumab -aqqg (EVENITY ) 105 MG/1. injection 210 mg    Allergies (verified) Codeine and Prednisone    History: Past Medical History:  Diagnosis Date   ADD (attention deficit disorder)    Allergy 2020   Arthritis    Past Surgical History:  Procedure Laterality Date   BILATERAL SALPINGECTOMY Bilateral    COSMETIC SURGERY     EXCISION METACARPAL MASS Right 11/01/2021   Procedure: EXCISION METACARPAL MASS RIGHT INDEX FINGER;  Surgeon: Murrell Drivers, MD;  Location: [facility];  Service: Orthopedics;  Laterality: Right;   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   TOTAL HIP ARTHROPLASTY Right 12/2020   TUBAL LIGATION  05/30/1998   Family History  Problem Relation Age of Onset  Arrhythmia Mother    Eczema Mother    Allergic rhinitis Mother    Heart disease Mother    Arthritis Mother    Hearing loss Mother    Vision loss Mother    Varicose Veins Mother    Allergic rhinitis Father    Heart disease Father    Arthritis Father    Hearing loss Father    Vision loss Father    Heart disease Brother    Heart disease Maternal Grandfather    Stroke Paternal Grandfather    Healthy Daughter    Eczema Daughter    Healthy Daughter     Healthy Son    Heart disease Maternal Grandmother    Heart disease Paternal Grandmother    ADD / ADHD Daughter    Anxiety disorder Brother    Depression Brother    Drug abuse Brother    Heart disease Brother    Obesity Brother    Diabetes Neg Hx    Hyperlipidemia Neg Hx    Hypertension Neg Hx    Social History   Socioeconomic History   Marital status: Married    Spouse name: Not on file   Number of children: 3   Years of education: Not on file   Highest education level: Master's degree (e.g., MA, MS, MEng, MEd, MSW, MBA)  Occupational History   Occupation: Forensic scientist   Occupation: Optician, dispensing  Tobacco Use   Smoking status: Never    Passive exposure: Past   Smokeless tobacco: Never  Vaping Use   Vaping status: Never Used  Substance and Sexual Activity   Alcohol use: No   Drug use: No   Sexual activity: Yes    Partners: Male    Birth control/protection: Post-menopausal  Other Topics Concern   Not on file  Social History Narrative   Not on file   Social Drivers of Health   Financial Resource Strain: Low Risk  (07/15/2024)   Overall Financial Resource Strain (CARDIA)    Difficulty of Paying Living Expenses: Not hard at all  Food Insecurity: No Food Insecurity (07/15/2024)   Hunger Vital Sign    Worried About Running Out of Food in the Last Year: Never true    Ran Out of Food in the Last Year: Never true  Transportation Needs: No Transportation Needs (05/14/2024)   PRAPARE - Administrator, Civil Service (Medical): No    Lack of Transportation (Non-Medical): No  Physical Activity: Sufficiently Active (07/15/2024)   Exercise Vital Sign    Days of Exercise per Week: 4 days    Minutes of Exercise per Session: 120 min  Stress: No Stress Concern Present (07/15/2024)   [employer] of Occupational Health - Occupational Stress Questionnaire    Feeling of Stress: Not at all  Social Connections: Socially Integrated (07/15/2024)   Social  Connection and Isolation Panel    Frequency of Communication with Friends and Family: More than three times a week    Frequency of Social Gatherings with Friends and Family: More than three times a week    Attends Religious Services: More than 4 times per year    Active Member of [employer] or Organizations: Yes    Attends Engineer, structural: More than 4 times per year    Marital Status: Married    Tobacco Counseling Counseling given: Not Answered    Clinical Intake:  Pre-visit preparation completed: Yes  Pain : No/denies pain     BMI - recorded: 23.89 Nutritional  Status: BMI of 19-24  Normal Nutritional Risks: None Diabetes: No  Lab Results  Component Value Date   HGBA1C 5.4 10/03/2011     How often do you need to have someone help you when you read instructions, pamphlets, or other written materials from your doctor or pharmacy?: 1 - Never  Interpreter Needed?: No  Information entered by :: Rojelio Blush LPN   Activities of Daily Living     07/15/2024    4:06 PM 07/10/2024    7:41 PM  In your present state of health, do you have any difficulty performing the following activities:  Hearing? 0 0  Vision? 0 0  Difficulty concentrating or making decisions? 0 0  Walking or climbing stairs? 0 0  Dressing or bathing? 0 0  Doing errands, shopping? 0 0  Preparing Food and eating ? N N  Using the Toilet? N N  In the past six months, have you accidently leaked urine? N Y  Do you have problems with loss of bowel control? N N  Managing your Medications? N N  Managing your Finances? N N  Housekeeping or managing your Housekeeping? N N    Patient Care Team: Antonio Meth, Jamee SAUNDERS, DO as PCP - General Vincente Grip, MD as Consulting Physician (Psychiatry) Margeret Kotyk, MD as Referring Physician (Physical Medicine and Rehabilitation) Beverley Evalene BIRCH, MD as Attending Physician (Orthopedic Surgery) Marget Lenis, MD as Consulting Physician (Obstetrics and  Gynecology) Shelah Lamar RAMAN, MD as Consulting Physician (Pulmonary Disease)  I have updated your Care Teams any recent Medical Services you may have received from other providers in the past year.     Assessment:   This is a routine wellness examination for Kamelia.  Hearing/Vision screen Hearing Screening - Comments:: Denies hearing difficulties   Vision Screening - Comments:: Wears rx glasses - up to date with routine eye exams with  [facility]   Goals Addressed               This Visit's Progress     Continue physical activity (pt-stated)        Stay active       Depression Screen     07/15/2024    4:05 PM 05/20/2024    3:48 PM 06/28/2023    2:20 PM 04/10/2023    1:58 PM 02/02/2022    2:11 PM 11/10/2021    3:22 PM 04/25/2018    8:42 AM  PHQ 2/9 Scores  PHQ - 2 Score 0 0 0 0 0 0 0  PHQ- 9 Score       1    Fall Risk     07/15/2024    4:08 PM 07/10/2024    7:41 PM 05/20/2024    3:48 PM 06/27/2023    9:30 PM 04/10/2023    1:58 PM  Fall Risk   Falls in the past year? 0 0 0 0 0  Number falls in past yr: 0 0 0 0 0  Injury with Fall? 0 0 0 0 0  Risk for fall due to : No Fall Risks   No Fall Risks   Follow up Falls evaluation completed  Falls evaluation completed Falls evaluation completed Falls evaluation completed    MEDICARE RISK AT HOME:  Medicare Risk at Home Any stairs in or around the home?: Yes If so, are there any without handrails?: Yes Home free of loose throw rugs in walkways, pet beds, electrical cords, etc?: No Adequate lighting in your home to reduce  risk of falls?: Yes Life alert?: No Use of a cane, walker or w/c?: No Grab bars in the bathroom?: No Shower chair or bench in shower?: No Elevated toilet seat or a handicapped toilet?: No  TIMED UP AND GO:  Was the test performed?  No  Cognitive Function: 6CIT completed        07/15/2024    4:09 PM 06/28/2023    2:23 PM  6CIT Screen  What Year? 0 points 0 points  What month? 0 points 0 points   What time? 0 points 0 points  Count back from 20 0 points 0 points  Months in reverse 0 points 0 points  Repeat phrase 0 points 0 points  Total Score 0 points 0 points    Immunizations Immunization History  Administered Date(s) Administered   Fluad Trivalent(High Dose 65+) 06/20/2023   Influenza Split 07/16/2013   Influenza Whole 06/16/2012   Influenza,inj,Quad PF,6+ Mos 07/10/2015, 06/20/2017, 07/09/2018   Influenza-Unspecified 05/17/2020, 06/23/2022, 06/11/2024   MMR 05/08/2018   Moderna Sars-Covid-2 Vaccination 10/28/2019, 11/26/2019, 08/31/2020, 05/14/2021   PNEUMOCOCCAL CONJUGATE-20 04/10/2023   Tdap 10/03/2011, 02/02/2022   Zoster Recombinant(Shingrix) 04/14/2017, 08/04/2017   Zoster, Live 10/03/2011    Screening Tests Health Maintenance  Topic Date Due   COVID-19 Vaccine (5 - 2025-26 season) 06/16/2024   Mammogram  07/09/2025   Medicare Annual Wellness (AWV)  07/15/2025   Colonoscopy  03/26/2031   DTaP/Tdap/Td (3 - Td or Tdap) 02/03/2032   Pneumococcal Vaccine: 50+ Years  Completed   Influenza Vaccine  Completed   DEXA SCAN  Completed   Hepatitis C Screening  Completed   Zoster Vaccines- Shingrix  Completed   HPV VACCINES  Aged Out   Meningococcal B Vaccine  Aged Out    Health Maintenance Items Addressed:   Additional Screening:  Vision Screening: Recommended annual ophthalmology exams for early detection of glaucoma and other disorders of the eye. Is the patient up to date with their annual eye exam?  Yes  Who is the provider or what is the name of the office in which the patient attends annual eye exams? [facility]  Dental Screening: Recommended annual dental exams for proper oral hygiene  Community Resource Referral / Chronic Care Management: CRR required this visit?  No   CCM required this visit?  No   Plan:    I have personally reviewed and noted the following in the patient's chart:   Medical and social history Use of alcohol, tobacco  or illicit drugs  Current medications and supplements including opioid prescriptions. Patient is not currently taking opioid prescriptions. Functional ability and status Nutritional status Physical activity Advanced directives List of other physicians Hospitalizations, surgeries, and ER visits in previous 12 months Vitals Screenings to include cognitive, depression, and falls Referrals and appointments  In addition, I have reviewed and discussed with patient certain preventive protocols, quality metrics, and best practice recommendations. A written personalized care plan for preventive services as well as general preventive health recommendations were provided to patient.   Rojelio LELON Blush, LPN   [id number]   After Visit Summary: (MyChart) Due to this being a telephonic visit, the after visit summary with patients personalized plan was offered to patient via MyChart   Notes: Nothing significant to report at this time.

## 2024-07-15 NOTE — Patient Instructions (Addendum)
 Jennifer Stokes,  Thank you for taking the time for your Medicare Wellness Visit. I appreciate your continued commitment to your health goals. Please review the care plan we discussed, and feel free to reach out if I can assist you further.  Medicare recommends these wellness visits once per year to help you and your care team stay ahead of potential health issues. These visits are designed to focus on prevention, allowing your provider to concentrate on managing your acute and chronic conditions during your regular appointments.  Please note that Annual Wellness Visits do not include a physical exam. Some assessments may be limited, especially if the visit was conducted virtually. If needed, we may recommend a separate in-person follow-up with your provider.  Ongoing Care Seeing your primary care provider every 3 to 6 months helps us  monitor your health and provide consistent, personalized care.   Referrals If a referral was made during today's visit and you haven't received any updates within two weeks, please contact the referred provider directly to check on the status.  Recommended Screenings:  Health Maintenance  Topic Date Due   COVID-19 Vaccine (5 - 2025-26 season) 06/16/2024   Breast Cancer Screening  07/09/2025   Medicare Annual Wellness Visit  07/15/2025   Colon Cancer Screening  03/26/2031   DTaP/Tdap/Td vaccine (3 - Td or Tdap) 02/03/2032   Pneumococcal Vaccine for age over 50  Completed   Flu Shot  Completed   DEXA scan (bone density measurement)  Completed   Hepatitis C Screening  Completed   Zoster (Shingles) Vaccine  Completed   HPV Vaccine  Aged Out   Meningitis B Vaccine  Aged Out       07/15/2024    4:08 PM  Advanced Directives  Does Patient Have a Medical Advance Directive? Yes  Type of Estate agent of [place];Living will  Copy of Healthcare Power of Attorney in Chart? No - copy requested   Advance Care Planning is important because  it: Ensures you receive medical care that aligns with your values, goals, and preferences. Provides guidance to your family and loved ones, reducing the emotional burden of decision-making during critical moments.  Vision: Annual vision screenings are recommended for early detection of glaucoma, cataracts, and diabetic retinopathy. These exams can also reveal signs of chronic conditions such as diabetes and high blood pressure.  Dental: Annual dental screenings help detect early signs of oral cancer, gum disease, and other conditions linked to overall health, including heart disease and diabetes.  Please see the attached documents for additional preventive care recommendations.

## 2024-08-11 ENCOUNTER — Ambulatory Visit (INDEPENDENT_AMBULATORY_CARE_PROVIDER_SITE_OTHER): Admitting: Podiatry

## 2024-08-11 ENCOUNTER — Ambulatory Visit: Admitting: Family Medicine

## 2024-08-11 ENCOUNTER — Ambulatory Visit (INDEPENDENT_AMBULATORY_CARE_PROVIDER_SITE_OTHER)

## 2024-08-11 ENCOUNTER — Encounter: Payer: Self-pay | Admitting: Podiatry

## 2024-08-11 ENCOUNTER — Ambulatory Visit

## 2024-08-11 DIAGNOSIS — M2041 Other hammer toe(s) (acquired), right foot: Secondary | ICD-10-CM

## 2024-08-11 DIAGNOSIS — E538 Deficiency of other specified B group vitamins: Secondary | ICD-10-CM | POA: Diagnosis not present

## 2024-08-11 DIAGNOSIS — M2042 Other hammer toe(s) (acquired), left foot: Secondary | ICD-10-CM | POA: Diagnosis not present

## 2024-08-11 MED ORDER — CYANOCOBALAMIN 1000 MCG/ML IJ SOLN
1000.0000 ug | Freq: Once | INTRAMUSCULAR | Status: AC
  Administered 2024-08-11: 1000 ug via INTRAMUSCULAR

## 2024-08-11 NOTE — Progress Notes (Signed)
Pt was given B12 injection with no complications.

## 2024-08-11 NOTE — Progress Notes (Signed)
  Subjective:  Patient ID: Jennifer Stokes, female    DOB: [birth year],   MRN: [id number]  No chief complaint on file.   68 y.o. female presents for concern of bilateral fourth toe pain.  This has been going on for several years.  Relates usually has it trimmed down on the fourth toe corns by her pedicurist.  She relates she does wear Band-Aids.  She relates certain shoes do feel better mostly tennis shoes.  She is tired of treating it this way and wondering if any other options.. Denies any other pedal complaints. Denies n/v/f/c.   Past Medical History:  Diagnosis Date   ADD (attention deficit disorder)    Allergy 2020   Arthritis     Objective:  Physical Exam: Vascular: DP/PT pulses 2/4 bilateral. CFT <3 seconds. Normal hair growth on digits. No edema.  Skin. No lacerations or abrasions bilateral feet.  Musculoskeletal: MMT 5/5 bilateral lower extremities in DF, PF, Inversion and Eversion. Deceased ROM in DF of ankle joint.  Hammered fourth digit bilateral.  Hyperkeratotic lesions noted to dorsum of PIPJ bilateral.  Tenderness to palpation.  Rigid deformity of the fourth digits bilateral. Neurological: Sensation intact to light touch.   Assessment:   1. Hammer toes of both feet      Plan:  Patient was evaluated and treated and all questions answered. -X-rays reviewed.  No acute fractures or dislocations.  Hammered fourth digit bilateral with abduction at the MPJ.  Bilateral.  -Educated on hammertoes and treatment options  -Hyperkeratotic lesions to bilateral fourth digits debrided as courtesy with chisel without incident. -Discussed padding including toe caps.  -Discussed need for potential surgery if pain does not improved.  -Patient to follow-up as needed. Discussed calling if any changes or increased pain.    Asberry Failing, DPM

## 2024-09-20 ENCOUNTER — Telehealth: Admitting: Nurse Practitioner

## 2024-09-20 DIAGNOSIS — J020 Streptococcal pharyngitis: Secondary | ICD-10-CM | POA: Diagnosis not present

## 2024-09-20 MED ORDER — LIDOCAINE VISCOUS HCL 2 % MT SOLN: 15.0000 mL | OROMUCOSAL | 0 refills | Status: AC | PRN

## 2024-09-20 MED ORDER — AMOXICILLIN 500 MG PO CAPS: 500.0000 mg | ORAL_CAPSULE | Freq: Two times a day (BID) | ORAL | 0 refills | Status: AC

## 2024-09-20 NOTE — Progress Notes (Signed)
 Virtual Visit Consent   Jennifer Stokes, you are scheduled for a virtual visit with a [facility] provider today. Just as with appointments in the office, your consent must be obtained to participate. Your consent will be active for this visit and any virtual visit you may have with one of our providers in the next 365 days. If you have a MyChart account, a copy of this consent can be sent to you electronically.  As this is a virtual visit, video technology does not allow for your provider to perform a traditional examination. This may limit your provider's ability to fully assess your condition. If your provider identifies any concerns that need to be evaluated in person or the need to arrange testing (such as labs, EKG, etc.), we will make arrangements to do so. Although advances in technology are sophisticated, we cannot ensure that it will always work on either your end or our end. If the connection with a video visit is poor, the visit may have to be switched to a telephone visit. With either a video or telephone visit, we are not always able to ensure that we have a secure connection.  By engaging in this virtual visit, you consent to the provision of healthcare and authorize for your insurance to be billed (if applicable) for the services provided during this visit. Depending on your insurance coverage, you may receive a charge related to this service.  I need to obtain your verbal consent now. Are you willing to proceed with your visit today? Jennifer Stokes has provided verbal consent on 09/20/2024 for a virtual visit (video or telephone). Haze LELON Servant, NP  Date: 09/20/2024 5:39 PM   Virtual Visit via Video Note   I, Haze LELON Servant, connected with  Jennifer Stokes  ([id number], 1955/12/05) on 09/20/24 at  5:30 PM EST by a video-enabled telemedicine application and verified that I am speaking with the correct person using two identifiers.  Location: Patient: Virtual Visit Location Patient:  Home Provider: Virtual Visit Location Provider: Home Office   I discussed the limitations of evaluation and management by telemedicine and the availability of in person appointments. The patient expressed understanding and agreed to proceed.    History of Present Illness: Jennifer Stokes is a 68 y.o. who identifies as a female who was assigned female at birth, and is being seen today for sore throat.  Jennifer Stokes had contact with her sick Grandchildren over the past week (both were diagnosed with ear infections). For the past several days she has been experiencing vocal hoarseness, headache, cough which has been keeping her up at night, and worsening sore throat. FLU and COVID negative.   Problems:  Patient Active Problem List   Diagnosis Date Noted   Attention deficit disorder 04/10/2023   Hyperlipidemia 04/10/2023   Vitamin D  deficiency 04/10/2023   Pulmonary nodules 04/25/2022   Allergic conjunctivitis of both eyes 06/29/2021   Irritant contact dermatitis due to plants, except food 06/29/2021   Seasonal allergies 06/29/2021   Recurrent sinusitis 06/29/2021   Right hip pain 12/14/2020   Exposure to COVID-19 virus 12/14/2020   Preventative health care 12/14/2020   Preop examination 12/14/2020   Heel pain 07/09/2018   Contusion, arm, upper 06/04/2018   Degenerative arthritis of left knee 05/08/2017   Residual foreign body in soft tissue 05/14/2016   Head injury 08/19/2009   DEPRESSION 12/18/2007    Allergies:  Allergies  Allergen Reactions   Codeine Other (See Comments)  Wired   Prednisone  Other (See Comments)    Wired   Medications:  Current Outpatient Medications:    amoxicillin  (AMOXIL ) 500 MG capsule, Take 1 capsule (500 mg total) by mouth 2 (two) times daily for 10 days., Disp: 20 capsule, Rfl: 0   lidocaine  (XYLOCAINE ) 2 % solution, Use as directed 15 mLs in the mouth or throat as needed for mouth pain., Disp: 100 mL, Rfl: 0   alendronate  (FOSAMAX ) 70 MG tablet,  TAKE 1 TABLET (70 MG TOTAL) BY MOUTH EVERY 7 DAYS WITH FULL GLASS WATER ON EMPTY STOMACH, Disp: 12 tablet, Rfl: 2   denosumab  (PROLIA ) 60 MG/ML SOSY injection, Inject 60 mg into the skin every 6 (six) months. Dx code: M85.80.  Pt has appt  06/10/24., Disp: 1 mL, Rfl: 0   JORNAY PM  40 MG CP24, , Disp: , Rfl:    sertraline  (ZOLOFT ) 50 MG tablet, Take 1 tablet by mouth daily., Disp: , Rfl:    traZODone (DESYREL) 50 MG tablet, Take 50-150 mg by mouth at bedtime., Disp: , Rfl:    Vitamin D , Ergocalciferol , (DRISDOL ) 1.25 MG (50000 UNIT) CAPS capsule, TAKE 1 CAPSULE BY MOUTH ONE TIME PER WEEK, Disp: 12 capsule, Rfl: 3  Current Facility-Administered Medications:    [START ON 12/07/2024] denosumab  (PROLIA ) injection 60 mg, 60 mg, Subcutaneous, Q6 months, Lowne Chase, Yvonne R, DO   Romosozumab -aqqg (EVENITY ) 105 MG/1. injection 210 mg, 210 mg, Subcutaneous, Once, [employer], Bovey R, DO  Observations/Objective: Patient is well-developed, well-nourished in no acute distress.  Resting comfortably at home.  Head is normocephalic, atraumatic.  No labored breathing.  Speech is clear and coherent with logical content.  Patient is alert and oriented at baseline.    Assessment and Plan: 1. Strep pharyngitis (Primary) - amoxicillin  (AMOXIL ) 500 MG capsule; Take 1 capsule (500 mg total) by mouth 2 (two) times daily for 10 days.  Dispense: 20 capsule; Refill: 0 - lidocaine  (XYLOCAINE ) 2 % solution; Use as directed 15 mLs in the mouth or throat as needed for mouth pain.  Dispense: 100 mL; Refill: 0  Alternate with motrin and tylenol  for sore throat pain.   Follow Up Instructions: I discussed the assessment and treatment plan with the patient. The patient was provided an opportunity to ask questions and all were answered. The patient agreed with the plan and demonstrated an understanding of the instructions.  A copy of instructions were sent to the patient via MyChart unless otherwise noted below.   The  patient was advised to call back or seek an in-person evaluation if the symptoms worsen or if the condition fails to improve as anticipated.    Stylianos Stradling W Sharilynn Cassity, NP

## 2024-09-20 NOTE — Patient Instructions (Signed)
 [name], thank you for joining Haze LELON Servant, NP for today's virtual visit.  While this provider is not your primary care provider (PCP), if your PCP is located in our provider database this encounter information will be shared with them immediately following your visit.   A [facility] MyChart account gives you access to today's visit and all your visits, tests, and labs performed at [facility]  click here if you don't have a [facility] MyChart account or go to [email or web address]  Consent: (Patient) [name] provided verbal consent for this virtual visit at the beginning of the encounter.  Current Medications:  Current Outpatient Medications:    amoxicillin  (AMOXIL ) 500 MG capsule, Take 1 capsule (500 mg total) by mouth 2 (two) times daily for 10 days., Disp: 20 capsule, Rfl: 0   lidocaine  (XYLOCAINE ) 2 % solution, Use as directed 15 mLs in the mouth or throat as needed for mouth pain., Disp: 100 mL, Rfl: 0   alendronate  (FOSAMAX ) 70 MG tablet, TAKE 1 TABLET (70 MG TOTAL) BY MOUTH EVERY 7 DAYS WITH FULL GLASS WATER ON EMPTY STOMACH, Disp: 12 tablet, Rfl: 2   denosumab  (PROLIA ) 60 MG/ML SOSY injection, Inject 60 mg into the skin every 6 (six) months. Dx code: M85.80.  Pt has appt  06/10/24., Disp: 1 mL, Rfl: 0   JORNAY PM  40 MG CP24, , Disp: , Rfl:    sertraline  (ZOLOFT ) 50 MG tablet, Take 1 tablet by mouth daily., Disp: , Rfl:    traZODone (DESYREL) 50 MG tablet, Take 50-150 mg by mouth at bedtime., Disp: , Rfl:    Vitamin D , Ergocalciferol , (DRISDOL ) 1.25 MG (50000 UNIT) CAPS capsule, TAKE 1 CAPSULE BY MOUTH ONE TIME PER WEEK, Disp: 12 capsule, Rfl: 3  Current Facility-Administered Medications:    [START ON 12/07/2024] denosumab  (PROLIA ) injection 60 mg, 60 mg, Subcutaneous, Q6 months, Lowne Chase, Yvonne R, DO   Romosozumab -aqqg (EVENITY ) 105 MG/1. injection 210 mg, 210 mg, Subcutaneous, Once, Antonio Meth, Yvonne R, DO   Medications ordered in this  encounter:  Meds ordered this encounter  Medications   amoxicillin  (AMOXIL ) 500 MG capsule    Sig: Take 1 capsule (500 mg total) by mouth 2 (two) times daily for 10 days.    Dispense:  20 capsule    Refill:  0    Supervising Provider:   LAMPTEY, PHILIP O [[id number]]   lidocaine  (XYLOCAINE ) 2 % solution    Sig: Use as directed 15 mLs in the mouth or throat as needed for mouth pain.    Dispense:  100 mL    Refill:  0    Supervising Provider:   BLAISE ALEENE KIDD [[id number]]     *If you need refills on other medications prior to your next appointment, please contact your pharmacy*  Follow-Up: Call back or seek an in-person evaluation if the symptoms worsen or if the condition fails to improve as anticipated.  [facility] [phone]  Other Instructions Alternate with motrin and tylenol  for sore throat pain.     If you have been instructed to have an in-person evaluation today at a local Urgent Care facility, please use the link below. It will take you to a list of all of our available [facility], including address, phone number and hours of operation. Please do not delay care.  [facility]  If you or a family member do not have a primary care provider, use the link below  to schedule a visit and establish care. When you choose a [facility] primary care physician or advanced practice provider, you gain a long-term partner in health. Find a Primary Care Provider  Learn more about [facility]'s in-office and virtual care options: [facility] - Get Care Now

## 2024-09-22 ENCOUNTER — Ambulatory Visit

## 2024-09-29 ENCOUNTER — Telehealth: Payer: Self-pay

## 2024-09-29 ENCOUNTER — Ambulatory Visit

## 2024-09-29 NOTE — Telephone Encounter (Signed)
 Called pt to make her aware that her B12 injection can not be done today due to it not being checked this year and the last B12 labs was of 03/2023, Pt stated she has gotten it done this year with other lab panel, mad pt aware that it was Vitamin D  not B12

## 2024-10-02 ENCOUNTER — Other Ambulatory Visit: Payer: Self-pay | Admitting: Family Medicine

## 2024-10-02 ENCOUNTER — Telehealth

## 2024-10-02 DIAGNOSIS — J208 Acute bronchitis due to other specified organisms: Secondary | ICD-10-CM | POA: Diagnosis not present

## 2024-10-02 DIAGNOSIS — E538 Deficiency of other specified B group vitamins: Secondary | ICD-10-CM

## 2024-10-02 NOTE — Telephone Encounter (Signed)
 Per Dr. Antonio- Pt can continue B12 injections w/o lab. Please call Pt to reschedule. Thank you

## 2024-10-02 NOTE — Telephone Encounter (Signed)
 Pt's B12 hasn't been checked since June 2024

## 2024-10-03 MED ORDER — ALBUTEROL SULFATE HFA 108 (90 BASE) MCG/ACT IN AERS: 1.0000 | INHALATION_SPRAY | Freq: Four times a day (QID) | RESPIRATORY_TRACT | 0 refills | Status: AC | PRN

## 2024-10-03 MED ORDER — BENZONATATE 100 MG PO CAPS: 100.0000 mg | ORAL_CAPSULE | Freq: Three times a day (TID) | ORAL | 0 refills | Status: AC | PRN

## 2024-10-03 NOTE — Progress Notes (Signed)
 We are sorry that you are not feeling well.  Here is how we plan to help!  Based on your presentation I believe you most likely have A cough due to a virus.  This is called viral bronchitis and is best treated by rest, plenty of fluids and control of the cough.  You may use Ibuprofen or Tylenol  as directed to help your symptoms.     In addition you may use A prescription cough medication called Tessalon  Perles 100mg . You may take 1-2 capsules every 8 hours as needed for your cough.  I have prescribed Albuterol  inhaler Use 1-2 puffs every 6 hours as needed for shortness of breath, chest tightness, and/or wheezing.  From your responses in the eVisit questionnaire you describe inflammation in the upper respiratory tract which is causing a significant cough.  This is commonly called Bronchitis and has four common causes:   Allergies Viral Infections Acid Reflux Bacterial Infection Allergies, viruses and acid reflux are treated by controlling symptoms or eliminating the cause. An example might be a cough caused by taking certain blood pressure medications. You stop the cough by changing the medication. Another example might be a cough caused by acid reflux. Controlling the reflux helps control the cough.  USE OF BRONCHODILATOR (RESCUE) INHALERS: There is a risk from using your bronchodilator too frequently.  The risk is that over-reliance on a medication which only relaxes the muscles surrounding the breathing tubes can reduce the effectiveness of medications prescribed to reduce swelling and congestion of the tubes themselves.  Although you feel brief relief from the bronchodilator inhaler, your asthma may actually be worsening with the tubes becoming more swollen and filled with mucus.  This can delay other crucial treatments, such as oral steroid medications. If you need to use a bronchodilator inhaler daily, several times per day, you should discuss this with your provider.  There are probably  better treatments that could be used to keep your asthma under control.     HOME CARE Only take medications as instructed by your medical team. Complete the entire course of an antibiotic. Drink plenty of fluids and get plenty of rest. Avoid close contacts especially the very young and the elderly Cover your mouth if you cough or cough into your sleeve. Always remember to wash your hands A steam or ultrasonic humidifier can help congestion.   GET HELP RIGHT AWAY IF: You develop worsening fever. You become short of breath You cough up blood. Your symptoms persist after you have completed your treatment plan MAKE SURE YOU  Understand these instructions. Will watch your condition. Will get help right away if you are not doing well or get worse.  Your e-visit answers were reviewed by a board certified advanced clinical practitioner to complete your personal care plan.  Depending on the condition, your plan could have included both over the counter or prescription medications. If there is a problem please reply  once you have received a response from your provider. Your safety is important to us .  If you have drug allergies check your prescription carefully.    You can use MyChart to ask questions about todays visit, request a non-urgent call back, or ask for a work or school excuse for 24 hours related to this e-Visit. If it has been greater than 24 hours you will need to follow up with your provider, or enter a new e-Visit to address those concerns. You will get an e-mail in the next two days asking about your  experience.  I hope that your e-visit has been valuable and will speed your recovery. Thank you for using e-visits.   I have spent 5 minutes in review of e-visit questionnaire, review and updating patient chart, medical decision making and response to patient.   Delon CHRISTELLA Dickinson, PA-C

## 2024-10-05 ENCOUNTER — Telehealth: Admitting: Family Medicine

## 2024-10-05 DIAGNOSIS — J40 Bronchitis, not specified as acute or chronic: Secondary | ICD-10-CM | POA: Diagnosis not present

## 2024-10-05 MED ORDER — AZITHROMYCIN 250 MG PO TABS: ORAL_TABLET | ORAL | 0 refills | Status: AC

## 2024-10-05 NOTE — Progress Notes (Signed)
 " Virtual Visit Consent   Jennifer Stokes, you are scheduled for a virtual visit with a [facility] provider today. Just as with appointments in the office, your consent must be obtained to participate. Your consent will be active for this visit and any virtual visit you may have with one of our providers in the next 365 days. If you have a MyChart account, a copy of this consent can be sent to you electronically.  As this is a virtual visit, video technology does not allow for your provider to perform a traditional examination. This may limit your provider's ability to fully assess your condition. If your provider identifies any concerns that need to be evaluated in person or the need to arrange testing (such as labs, EKG, etc.), we will make arrangements to do so. Although advances in technology are sophisticated, we cannot ensure that it will always work on either your end or our end. If the connection with a video visit is poor, the visit may have to be switched to a telephone visit. With either a video or telephone visit, we are not always able to ensure that we have a secure connection.  By engaging in this virtual visit, you consent to the provision of healthcare and authorize for your insurance to be billed (if applicable) for the services provided during this visit. Depending on your insurance coverage, you may receive a charge related to this service.  I need to obtain your verbal consent now. Are you willing to proceed with your visit today? Jennifer Stokes has provided verbal consent on 10/05/2024 for a virtual visit (video or telephone). Loa Lamp, FNP  Date: 10/05/2024 4:03 PM   Virtual Visit via Video Note   I, Loa Lamp, connected with  Jennifer Stokes  ([id number], May 03, 1956) on 10/05/2024 at  4:00 PM EST by a video-enabled telemedicine application and verified that I am speaking with the correct person using two identifiers.  Location: Patient: Virtual Visit Location Patient:  Home Provider: Virtual Visit Location Provider: Home Office   I discussed the limitations of evaluation and management by telemedicine and the availability of in person appointments. The patient expressed understanding and agreed to proceed.    History of Present Illness: Jennifer Stokes is a 68 y.o. who identifies as a female who was assigned female at birth, and is being seen today for cough for 3 weeks from grandchildren. Finished amoxil  a couple of weeks ago. Coughing green plugs. No fever now, no wheezing or sob. Taking mucinex.   HPI: HPI  Problems:  Patient Active Problem List   Diagnosis Date Noted   Attention deficit disorder 04/10/2023   Hyperlipidemia 04/10/2023   Vitamin D  deficiency 04/10/2023   Pulmonary nodules 04/25/2022   Allergic conjunctivitis of both eyes 06/29/2021   Irritant contact dermatitis due to plants, except food 06/29/2021   Seasonal allergies 06/29/2021   Recurrent sinusitis 06/29/2021   Right hip pain 12/14/2020   Exposure to COVID-19 virus 12/14/2020   Preventative health care 12/14/2020   Preop examination 12/14/2020   Heel pain 07/09/2018   Contusion, arm, upper 06/04/2018   Degenerative arthritis of left knee 05/08/2017   Residual foreign body in soft tissue 05/14/2016   Head injury 08/19/2009   DEPRESSION 12/18/2007    Allergies: Allergies[1] Medications: Current Medications[2]  Observations/Objective: Patient is well-developed, well-nourished in no acute distress.  Resting comfortably  at home.  Head is normocephalic, atraumatic.  No labored breathing.  Speech is clear and coherent with  logical content.  Patient is alert and oriented at baseline.    Assessment and Plan: 1. Bronchitis (Primary)  Increase fluids, humidifier at night, tylenol  or ibuprofen. UC as needed.   Follow Up Instructions: I discussed the assessment and treatment plan with the patient. The patient was provided an opportunity to ask questions and all were answered.  The patient agreed with the plan and demonstrated an understanding of the instructions.  A copy of instructions were sent to the patient via MyChart unless otherwise noted below.     The patient was advised to call back or seek an in-person evaluation if the symptoms worsen or if the condition fails to improve as anticipated.    Jumar Greenstreet, FNP     [1]  Allergies Allergen Reactions   Codeine Other (See Comments)    Wired   Prednisone  Other (See Comments)    Wired  [2]  Current Outpatient Medications:    albuterol  (VENTOLIN  HFA) 108 (90 Base) MCG/ACT inhaler, Inhale 1-2 puffs into the lungs every 6 (six) hours as needed., Disp: 8 g, Rfl: 0   benzonatate  (TESSALON ) 100 MG capsule, Take 1-2 capsules (100-200 mg total) by mouth 3 (three) times daily as needed., Disp: 30 capsule, Rfl: 0   denosumab  (PROLIA ) 60 MG/ML SOSY injection, Inject 60 mg into the skin every 6 (six) months. Dx code: M85.80.  Pt has appt  06/10/24., Disp: 1 mL, Rfl: 0   JORNAY PM  40 MG CP24, , Disp: , Rfl:    lidocaine  (XYLOCAINE ) 2 % solution, Use as directed 15 mLs in the mouth or throat as needed for mouth pain., Disp: 100 mL, Rfl: 0   sertraline  (ZOLOFT ) 50 MG tablet, Take 1 tablet by mouth daily., Disp: , Rfl:    traZODone (DESYREL) 50 MG tablet, Take 50-150 mg by mouth at bedtime., Disp: , Rfl:    Vitamin D , Ergocalciferol , (DRISDOL ) 1.25 MG (50000 UNIT) CAPS capsule, TAKE 1 CAPSULE BY MOUTH ONE TIME PER WEEK, Disp: 12 capsule, Rfl: 3  Current Facility-Administered Medications:    [START ON 12/07/2024] denosumab  (PROLIA ) injection 60 mg, 60 mg, Subcutaneous, Q6 months, Lowne Chase, Yvonne R, DO   Romosozumab -aqqg (EVENITY ) 105 MG/1. injection 210 mg, 210 mg, Subcutaneous, Once, [employer], [place] R, DO  "

## 2024-10-05 NOTE — Patient Instructions (Signed)

## 2024-10-13 ENCOUNTER — Telehealth: Admitting: Physician Assistant

## 2024-10-13 DIAGNOSIS — A084 Viral intestinal infection, unspecified: Secondary | ICD-10-CM

## 2024-10-13 MED ORDER — ONDANSETRON 4 MG PO TBDP: 4.0000 mg | ORAL_TABLET | Freq: Three times a day (TID) | ORAL | 0 refills | Status: AC | PRN

## 2024-10-13 NOTE — Progress Notes (Signed)

## 2024-11-17 ENCOUNTER — Other Ambulatory Visit (HOSPITAL_COMMUNITY): Payer: Self-pay

## 2024-11-17 ENCOUNTER — Telehealth: Payer: Self-pay

## 2024-11-17 NOTE — Telephone Encounter (Signed)
 Prolia  VOB initiated via [email or web address]  Next Prolia  inj DUE: 12/11/24   PHARMACY COPAY: $955.94  STOBOCLO: $111.12

## 2024-11-19 ENCOUNTER — Other Ambulatory Visit (HOSPITAL_COMMUNITY): Payer: Self-pay

## 2024-11-19 NOTE — Telephone Encounter (Signed)
 MEDICARE DOES NOT COVER PROLIA  FOR OSTEOPENIA

## 2024-11-19 NOTE — Telephone Encounter (Signed)
 Pt ready for scheduling for PROLIA  on or after : 12/11/24   Option# 2- Med Obtained from pharmacy:   Pharmacy benefit: Copay $955.94 (Paid to pharmacy) Admin Fee: 0% (Pay at clinic)  Prior Auth: N/A PA# Expiration Date:   # of doses approved:   If patient wants fill through the pharmacy benefit please send prescription to: [facility], and include estimated need by date in rx notes. Pharmacy will ship medication directly to the office.  Patient NOT eligible for Prolia  Copay Card. Copay Card can make patient's cost as little as $25. Link to apply: [email or web address]://[email or web address].[email or web address]/copay  ** This summary of benefits is an estimation of the patient's out-of-pocket cost. Exact cost may very based on individual plan coverage.    *DIAGNOSIS IS NOT COVERED FOR MEDICAL BENEFIT*

## 2025-07-23 ENCOUNTER — Ambulatory Visit
